# Patient Record
Sex: Male | Born: 1945 | Race: White | Hispanic: No | Marital: Married
Health system: Midwestern US, Academic
[De-identification: ages and names within clinical notes are randomized; demographics above are authoritative.]

---

## 2017-04-22 ENCOUNTER — Encounter: Admit: 2017-04-22 | Discharge: 2017-04-22 | Payer: MEDICARE

## 2017-04-29 ENCOUNTER — Encounter: Admit: 2017-04-29 | Discharge: 2017-04-29 | Payer: MEDICARE

## 2017-04-29 DIAGNOSIS — I480 Paroxysmal atrial fibrillation: Principal | ICD-10-CM

## 2017-05-12 ENCOUNTER — Encounter: Admit: 2017-05-12 | Discharge: 2017-05-12 | Payer: MEDICARE

## 2017-05-13 ENCOUNTER — Encounter: Admit: 2017-05-13 | Discharge: 2017-05-13 | Payer: MEDICARE

## 2017-05-13 DIAGNOSIS — I480 Paroxysmal atrial fibrillation: Principal | ICD-10-CM

## 2017-05-14 ENCOUNTER — Encounter: Admit: 2017-05-14 | Discharge: 2017-05-14 | Payer: MEDICARE

## 2017-05-18 LAB — COMPREHENSIVE METABOLIC PANEL
Lab: 15 — ABNORMAL HIGH (ref 0–14)
Lab: 20
Lab: 24
Lab: 53
Lab: 62
Lab: 85

## 2017-05-28 ENCOUNTER — Encounter: Admit: 2017-05-28 | Discharge: 2017-05-28 | Payer: MEDICARE

## 2017-05-28 DIAGNOSIS — I480 Paroxysmal atrial fibrillation: Principal | ICD-10-CM

## 2017-06-04 ENCOUNTER — Encounter: Admit: 2017-06-04 | Discharge: 2017-06-04 | Payer: MEDICARE

## 2017-06-04 DIAGNOSIS — I480 Paroxysmal atrial fibrillation: Principal | ICD-10-CM

## 2017-06-17 ENCOUNTER — Encounter: Admit: 2017-06-17 | Discharge: 2017-06-17 | Payer: MEDICARE

## 2017-06-22 ENCOUNTER — Encounter: Admit: 2017-06-22 | Discharge: 2017-06-22 | Payer: MEDICARE

## 2017-07-01 ENCOUNTER — Encounter: Admit: 2017-07-01 | Discharge: 2017-07-01 | Payer: MEDICARE

## 2017-07-01 DIAGNOSIS — I480 Paroxysmal atrial fibrillation: Principal | ICD-10-CM

## 2017-07-08 ENCOUNTER — Ambulatory Visit: Admit: 2017-07-08 | Discharge: 2017-07-09 | Payer: MEDICARE

## 2017-07-09 DIAGNOSIS — Z950 Presence of cardiac pacemaker: ICD-10-CM

## 2017-07-09 DIAGNOSIS — I495 Sick sinus syndrome: Principal | ICD-10-CM

## 2017-07-15 ENCOUNTER — Encounter: Admit: 2017-07-15 | Discharge: 2017-07-15 | Payer: MEDICARE

## 2017-07-15 DIAGNOSIS — I480 Paroxysmal atrial fibrillation: Principal | ICD-10-CM

## 2017-07-29 ENCOUNTER — Encounter: Admit: 2017-07-29 | Discharge: 2017-07-29 | Payer: MEDICARE

## 2017-07-29 DIAGNOSIS — I480 Paroxysmal atrial fibrillation: Principal | ICD-10-CM

## 2017-08-11 ENCOUNTER — Encounter: Admit: 2017-08-11 | Discharge: 2017-08-12 | Payer: MEDICARE

## 2017-08-11 DIAGNOSIS — R69 Illness, unspecified: Principal | ICD-10-CM

## 2017-08-18 ENCOUNTER — Encounter: Admit: 2017-08-18 | Discharge: 2017-08-18 | Payer: MEDICARE

## 2017-08-18 DIAGNOSIS — I480 Paroxysmal atrial fibrillation: Principal | ICD-10-CM

## 2017-08-25 ENCOUNTER — Encounter: Admit: 2017-08-25 | Discharge: 2017-08-25 | Payer: MEDICARE

## 2017-08-25 DIAGNOSIS — R69 Illness, unspecified: Principal | ICD-10-CM

## 2017-08-31 ENCOUNTER — Encounter: Admit: 2017-08-31 | Discharge: 2017-08-31 | Payer: MEDICARE

## 2017-08-31 DIAGNOSIS — I480 Paroxysmal atrial fibrillation: Principal | ICD-10-CM

## 2017-09-10 ENCOUNTER — Encounter: Admit: 2017-09-10 | Discharge: 2017-09-10 | Payer: MEDICARE

## 2017-09-10 DIAGNOSIS — Z950 Presence of cardiac pacemaker: ICD-10-CM

## 2017-09-10 DIAGNOSIS — E785 Hyperlipidemia, unspecified: Principal | ICD-10-CM

## 2017-09-10 DIAGNOSIS — R0789 Other chest pain: ICD-10-CM

## 2017-09-10 DIAGNOSIS — I2510 Atherosclerotic heart disease of native coronary artery without angina pectoris: ICD-10-CM

## 2017-09-10 DIAGNOSIS — I495 Sick sinus syndrome: ICD-10-CM

## 2017-09-10 DIAGNOSIS — I480 Paroxysmal atrial fibrillation: ICD-10-CM

## 2017-09-10 DIAGNOSIS — Z7901 Long term (current) use of anticoagulants: ICD-10-CM

## 2017-09-10 MED ORDER — ATORVASTATIN 20 MG PO TAB
ORAL_TABLET | Freq: Every day | 0 refills | Status: AC
Start: 2017-09-10 — End: 2018-01-25

## 2017-09-10 MED ORDER — CARVEDILOL 25 MG PO TAB
ORAL_TABLET | Freq: Two times a day (BID) | ORAL | 0 refills | 90.00000 days | Status: AC
Start: 2017-09-10 — End: 2018-04-01

## 2017-09-10 MED ORDER — WARFARIN 4 MG PO TAB
ORAL_TABLET | ORAL | 3 refills | 90.00000 days | Status: AC
Start: 2017-09-10 — End: 2017-09-14

## 2017-09-11 ENCOUNTER — Encounter: Admit: 2017-09-11 | Discharge: 2017-09-11 | Payer: MEDICARE

## 2017-09-11 DIAGNOSIS — I480 Paroxysmal atrial fibrillation: Principal | ICD-10-CM

## 2017-09-14 ENCOUNTER — Encounter: Admit: 2017-09-14 | Discharge: 2017-09-14 | Payer: MEDICARE

## 2017-09-14 MED ORDER — WARFARIN 4 MG PO TAB
ORAL_TABLET | ORAL | 3 refills | 90.00000 days | Status: AC
Start: 2017-09-14 — End: 2019-05-03

## 2017-09-23 ENCOUNTER — Ambulatory Visit: Admit: 2017-09-23 | Discharge: 2017-09-24 | Payer: MEDICARE

## 2017-09-23 ENCOUNTER — Encounter: Admit: 2017-09-23 | Discharge: 2017-09-23 | Payer: MEDICARE

## 2017-09-23 DIAGNOSIS — I480 Paroxysmal atrial fibrillation: Principal | ICD-10-CM

## 2017-09-23 DIAGNOSIS — I444 Left anterior fascicular block: ICD-10-CM

## 2017-09-23 DIAGNOSIS — G4730 Sleep apnea, unspecified: ICD-10-CM

## 2017-09-23 DIAGNOSIS — I2510 Atherosclerotic heart disease of native coronary artery without angina pectoris: ICD-10-CM

## 2017-09-23 DIAGNOSIS — I495 Sick sinus syndrome: ICD-10-CM

## 2017-09-23 DIAGNOSIS — G629 Polyneuropathy, unspecified: ICD-10-CM

## 2017-09-23 DIAGNOSIS — I952 Hypotension due to drugs: ICD-10-CM

## 2017-09-23 DIAGNOSIS — Z950 Presence of cardiac pacemaker: ICD-10-CM

## 2017-09-23 DIAGNOSIS — Z7901 Long term (current) use of anticoagulants: ICD-10-CM

## 2017-09-23 DIAGNOSIS — E782 Mixed hyperlipidemia: ICD-10-CM

## 2017-09-23 DIAGNOSIS — E785 Hyperlipidemia, unspecified: Secondary | ICD-10-CM

## 2017-09-23 DIAGNOSIS — I4891 Unspecified atrial fibrillation: ICD-10-CM

## 2017-09-23 DIAGNOSIS — E669 Obesity, unspecified: ICD-10-CM

## 2017-09-23 DIAGNOSIS — I493 Ventricular premature depolarization: ICD-10-CM

## 2017-09-23 NOTE — Progress Notes
Date of Service: 09/23/2017    Robert Mercado is a 71 y.o. male.       HPI     [name] is a very pleasant 70 year old white male with a history of coronary artery disease, status post previous intervention, history of atrial fibrillation and sick sinus syndrome and status post permanent pacemaker implant, previously was on sotalol therapy.  Patient underwent a pacemaker check on 08/11/2016 and it was noted at that time that he had a significant increase in the PVC burden.  He was also evaluated with a Holter monitor on 10/17/2016 and it was found that a total PVC burden was approximately 28%.    Patient was evaluated with a myocardial perfusion imaging study on 10/27/2016, ejection fraction was 43%, the perfusion pattern did show a defect involving the inferior and inferior septal wall, it was predominantly fixed with some reversal distribution and overall thought to be due to soft tissue attenuation.    Patient was evaluated by Dr. Betti Cruz in the office and he eventually underwent PVC ablation on 12/31/2016.  Prior to this he was admitted at [facility] with chest pain, at that hospital ejection fraction was assessed to be 35% and he underwent coronary angiography, he was not found to have any progression of disease.    Following the PVC ablation procedure, in May 2018 patient was evaluated with a Holter monitor, and these demonstrate 1% bottom of ventricular ectopic beats.    Patient is here today that he reports feeling very well from a cardiac standpoint.  He has not had any further episodes of heart palpitations.  The most recent assessment of the left ventricular systolic function predates the EP study and the PVC ablation procedure.         Vitals:    09/23/17 1005   BP: 138/80   Pulse: 67   Weight: 127 kg (280 lb)   Height: 1.854 m (6' 1)     Body mass index is 36.94 kg/m???.     Past Medical History  Patient Active Problem List    Diagnosis Date Noted ??? PVC (premature ventricular contraction) 12/31/2016   ??? PVC's (premature ventricular contractions) 11/06/2016     0214/18 PVC ablation - x 3 morphologies RVOT      ??? Preoperative cardiovascular examination 04/28/2016   ??? Hospital discharge follow-up 03/10/2016   ??? Chronic fatigue 08/21/2015   ??? Hypotension due to drugs 08/21/2015   ??? 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 Chronic anticoagulation, on warfarin 08/16/2014   ??? Sleep apnea 08/17/2013   ??? Obesity (BMI 30-39.9) 08/17/2013   ??? Paroxysmal atrial fibrillation (HCC) 04/19/2012   ??? 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 Chest pain 05/02/2011   ??? SSS (sick sinus syndrome) (HCC) 05/21/2010   ??? HLD (hyperlipidemia) 11/12/2009   ??? CAD (coronary artery disease) 11/12/2009     08/16/14: Cath by Dr. Mackey Birchwood: 30% instent restenosis of previously placed stent in the LAD, and patent 2nd DIAG stent and 60% distal LAD disease. Medical management     ??? Peripheral neuropathy 11/12/2009         Review of Systems   Constitution: Negative.   HENT: Positive for tinnitus.    Eyes: Negative.    Cardiovascular: Positive for dyspnea on exertion.   Respiratory: Positive for snoring.    Endocrine: Negative.    Hematologic/Lymphatic: Negative.    Skin: Negative.    Musculoskeletal: Positive for arthritis, back pain, joint pain, joint  swelling, muscle weakness, neck pain and stiffness.   Gastrointestinal: Negative.    Genitourinary: Negative.    Neurological: Negative.    Psychiatric/Behavioral: Negative.    Allergic/Immunologic: Negative.        Physical Exam  General Appearance: normal in appearance  Skin: warm, moist, no ulcers or xanthomas  Eyes: conjunctivae and lids normal, pupils are equal and round  Lips & Oral Mucosa: no pallor or cyanosis  Neck Veins: neck veins are flat, neck veins are not distended  Chest Inspection: chest is normal in appearance  Respiratory Effort: breathing comfortably, no respiratory distress Auscultation/Percussion: lungs clear to auscultation, no rales or rhonchi, no wheezing  Cardiac Rhythm: regular rhythm and normal rate  Cardiac Auscultation: S1, S2 normal, no rub, no gallop  Murmurs: no murmur  Carotid Arteries: normal carotid upstroke bilaterally, no bruit  Lower Extremity Edema: no lower extremity edema  Abdominal Exam: soft, non-tender, no masses, bowel sounds normal  Liver & Spleen: no organomegaly  Language and Memory: patient responsive and seems to comprehend information  Neurologic Exam: neurological assessment grossly intact      Cardiovascular Studies      Problems Addressed Today  Encounter Diagnoses   Name Primary?   ??? Paroxysmal atrial fibrillation (HCC) Yes   ??? Coronary artery disease involving native coronary artery of native heart without angina pectoris    ??? Mixed hyperlipidemia    ??? SSS (sick sinus syndrome) (HCC)    ??? Hypotension due to drugs    ??? LAFB (left anterior fascicular block)    ??? PVC (premature ventricular contraction)    ??? PVC's (premature ventricular contractions)    ??? Obesity (BMI 30-39.9)    ??? Chronic anticoagulation, on warfarin    ??? Cardiac pacemaker in situ        Assessment and Plan     Assessment:    1.  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 Patient underwent EP study and PVC ablation on 12/31/2016  ??? The postoperative Holter monitor demonstrated that the PVCs have decreased from 28% total burden to 1%    2.  History of sick sinus syndrome  ??? Patient underwent previous permanent pacemaker implant    3.  History of paroxysmal atrial fibrillation  ??? Patient has been in normal sinus rhythm  ??? Patient has been previously on sotalol therapy  ??? Patient continues to remain anticoagulated with warfarin    4.  Stable ischemic heart disease  ??? Patient underwent PTCA of the LAD on March 01 2009  ??? Patient underwent PCI of the diagonal branch at [facility] in [place] on January 12, 2012 ??? A left heart catheterization was performed on 08/16/2014, revealed 30% in-stent restenosis of a previously placed stent in the LAD, patent stent in the diagonal branch and 60% distal LAD  ??? The most recent left heart catheterization is dated 11/11/2016, it was performed at [facility], patient was not found to have any obstructive CAD    Plan:    1.  I did ask Mr. Evilsizor to continue all current medications  2.  2D echo Doppler study to be performed at patient's earliest convenience, will follow up on the results of the test and will call with further recommendations  3.  Follow-up office visit in 6 months in our [place] office         Current Medications (including today's revisions)  ??? aspirin EC 81 mg tablet Take 81  mg by mouth daily.   ??? atorvastatin (LIPITOR) 20 mg tablet TAKE 1 TABLET EVERY DAY   ??? carvedilol (COREG) 25 mg tablet TAKE 1/2 TABLET TWICE DAILY   ??? coenzyme Q10(+) (CO Q-10) 100 mg cap Take 100 mg by mouth daily.   ??? diltiazem CD (CARDIZEM CD) 180 mg capsule Take 180 mg by mouth twice daily.   ??? diphenhydrAMINE (BENADRYL) 25 mg capsule Take 50 mg by mouth at bedtime daily.   ??? finasteride (PROSCAR) 5 mg tablet Take 5 mg by mouth daily.   ??? gabapentin (NEURONTIN) 600 mg PO tablet Take 600 mg by mouth Three Times Daily.   ??? HYDROcodone-acetaminophen(+) (NORCO) 7.5-325 mg tablet Take 1 Tab by mouth every 6 hours as needed.   ??? losartan (COZAAR) 25 mg tablet Take 1 tablet by mouth daily.   ??? nitroglycerin (NITROSTAT) 0.4 mg tablet Take one tablet sublingually as needed every 5 min for chest pain X three   ??? pantoprazole DR (PROTONIX) 20 mg tablet Take 40 mg by mouth daily.   ??? vitamins, B complex PO Tab Take 1 Tab by mouth Daily.   ??? vitamins, multiple PO Cap Take 1 Cap by mouth Daily.   ??? warfarin (COUMADIN) 4 mg tablet Take 1-2 tabs as directed by [facility]

## 2017-09-28 ENCOUNTER — Encounter: Admit: 2017-09-28 | Discharge: 2017-09-28 | Payer: MEDICARE

## 2017-09-30 ENCOUNTER — Ambulatory Visit: Admit: 2017-09-30 | Discharge: 2017-10-01 | Payer: MEDICARE

## 2017-09-30 DIAGNOSIS — I2510 Atherosclerotic heart disease of native coronary artery without angina pectoris: ICD-10-CM

## 2017-09-30 DIAGNOSIS — I480 Paroxysmal atrial fibrillation: Principal | ICD-10-CM

## 2017-09-30 MED ORDER — PERFLUTREN LIPID MICROSPHERES 1.1 MG/ML IV SUSP
1-20 mL | Freq: Once | INTRAVENOUS | 0 refills | Status: CP
Start: 2017-09-30 — End: ?

## 2017-09-30 NOTE — Progress Notes
Peripheral IV Insertion Note:  Patient Side: right  Line Orientation:Antecubital  IV Catheter Size: 20G  Number of Attempts:1.  IV capped and flushed with Normal Saline.  IV site without redness, swelling, or pain.  New dressing placed.    After procedure IV cannula removed intact and hemostasis achieved.    Procedure explained, questions answered and Definity administered per standard without complications.   Total of 2 ml of Definity/NS given slow IVP per sonographer direction.

## 2017-10-05 ENCOUNTER — Encounter: Admit: 2017-10-05 | Discharge: 2017-10-05 | Payer: MEDICARE

## 2017-10-12 ENCOUNTER — Encounter: Admit: 2017-10-12 | Discharge: 2017-10-12 | Payer: MEDICARE

## 2017-10-12 DIAGNOSIS — I480 Paroxysmal atrial fibrillation: Principal | ICD-10-CM

## 2017-10-12 NOTE — Telephone Encounter
-----   Message from Nickolas MadridMarina Hannen, MD sent at 10/11/2017  5:07 PM CST -----  Dear Nurses,     Please let this patient know that his echocardiogram is overall unremarkable.  I think he should follow-up with me in the office in 6 months from the last office visit.    Thank you      ----- Message -----  From: Laurence Alyosamond, Thomas L, MD  Sent: 09/30/2017   4:17 PM  To: Nickolas MadridMarina Hannen, MD

## 2017-10-12 NOTE — Telephone Encounter
Results and recommendations called to patient.

## 2017-10-21 ENCOUNTER — Encounter: Admit: 2017-10-21 | Discharge: 2017-10-21 | Payer: MEDICARE

## 2017-10-21 ENCOUNTER — Ambulatory Visit: Admit: 2017-10-21 | Discharge: 2017-10-21 | Payer: MEDICARE

## 2017-10-21 DIAGNOSIS — Z950 Presence of cardiac pacemaker: ICD-10-CM

## 2017-10-21 DIAGNOSIS — G4730 Sleep apnea, unspecified: ICD-10-CM

## 2017-10-21 DIAGNOSIS — M654 Radial styloid tenosynovitis [de Quervain]: Principal | ICD-10-CM

## 2017-10-21 DIAGNOSIS — I480 Paroxysmal atrial fibrillation: ICD-10-CM

## 2017-10-21 DIAGNOSIS — I4891 Unspecified atrial fibrillation: ICD-10-CM

## 2017-10-21 DIAGNOSIS — Z7901 Long term (current) use of anticoagulants: ICD-10-CM

## 2017-10-21 DIAGNOSIS — E785 Hyperlipidemia, unspecified: Secondary | ICD-10-CM

## 2017-10-21 DIAGNOSIS — I2510 Atherosclerotic heart disease of native coronary artery without angina pectoris: Principal | ICD-10-CM

## 2017-10-21 DIAGNOSIS — I495 Sick sinus syndrome: ICD-10-CM

## 2017-10-21 DIAGNOSIS — G629 Polyneuropathy, unspecified: ICD-10-CM

## 2017-10-21 DIAGNOSIS — E669 Obesity, unspecified: ICD-10-CM

## 2017-10-21 NOTE — Progress Notes
Chief Complaint   Patient presents with   ??? New Patient     right wrist pain       History of Present Illness:     [name] is a 71 year old male presents with chief complaint of right wrist pain.  He states the wrist pain is been there for months to potentially years.  He says it began after lifting himself off a recliner with clenched fist.  He denies any trauma though.  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  He was a Education officer, environmental and a Runner, broadcasting/film/video.  He states that he thinks the pain is getting worse and he also told me that he had seen an orthopedic surgeon that noticed something and referred him to a hand specialist.  Of note he is on warfarin due to chronic atrial fibrillation as well as previous heart surgery with stenting.  He describes the pain on the dorsal aspect of the wrist as well as the dorsal radial aspect of the wrist.  He denies any numbness or tingling.    The following portions of the patient's history were reviewed and updated as appropriate.  Past Medical History:     Past Medical History:   Diagnosis Date   ??? Atrial fibrillation (HCC)    ??? CAD (coronary artery disease)    ??? 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 Chronic anticoagulation, on warfarin 08/16/2014   ??? HLD (hyperlipidemia) 11/12/2009   ??? Hyperlipemia    ??? Obesity (BMI 30-39.9) 08/17/2013   ??? Paroxysmal atrial fibrillation (HCC) 04/19/2012   ??? Peripheral neuropathy 11/12/2009   ??? Sleep apnea 08/17/2013   ??? Squamous cell carcinoma nos    ??? SSS (sick sinus syndrome) (HCC) 05/21/2010       Past Surgical History:     Past Surgical History:   Procedure Laterality Date   ??? APPENDECTOMY     ??? CARDIOVASCULAR STRESS TEST     ??? CHOLECYSTECTOMY     ??? CORONARY ANGIOPLASTY     ??? EYE SURGERY     ??? KNEE REPLACEMENT      right   ??? LAMINECTOMY     ??? MYOCARDIAL PERFUSION IMG STUDY     ??? SHOULDER SURGERY     ??? SINUS SURGERY         Family History:  Family History   Problem Relation Age of Onset ??? Coronary Artery Disease Mother    ??? Heart Failure Mother    ??? Coronary Artery Disease Father    ??? Heart Attack Father    ??? Heart Failure Father    ??? Coronary Artery Disease Brother    ??? Coronary Artery Disease Brother      Social History:   Social History     Social History   ??? Marital status: Married     Spouse name: N/A   ??? Number of children: N/A   ??? Years of education: N/A     Social History Main Topics   ??? Smoking status: Never Smoker   ??? Smokeless tobacco: Never Used   ??? Alcohol use No   ??? Drug use: No   ??? Sexual activity: Not on file     Other Topics Concern   ??? Not on file     Social History Narrative   ??? No narrative on file       Current Medications: has a current medication list which includes the following prescription(s): aspirin ec,  atorvastatin, carvedilol, coenzyme q10(+), diltiazem cd, diphenhydramine, finasteride, gabapentin, hydrocodone/acetaminophen, losartan, nitroglycerin, pantoprazole dr, vitamins, b complex, vitamins, multiple, and warfarin.    Allergies: is allergic to demerol (pf) [meperidine (pf)]; lyrica [pregabalin]; and tuberculin ppd tine test.    Review of Systems:  Review of Systems   Musculoskeletal: Positive for arthralgias.   Neurological: Positive for weakness.   All other systems reviewed and are negative.    General Physical Exam:  General/Constitutional:No apparent distress: well-nourished and well developed.  Eyes: Sclera nonicteric, conjunctiva clear  Respiratory:No shortness of breath or dyspnea  Cardiac: No clubbing, cyanosis, or edema  Vascular: No edema, swelling or tenderness, except as noted in detailed exam.  Integumentary:No impressive skin lesions present, except as noted in detailed exam.  Neuro/Psych: Normal mood and affect, oriented to person, place and time.  Musculoskeletal: Normal, except as noted in detailed exam and in HPI.      Focused Physical Examination:  Ht 185.4 cm (73)  - Wt 127 kg (280 lb)  - BMI 36.94 kg/m??? The patient is alert and orientated to person, place, and time, is in no acute distress, and is cooperative with the examination. Normocephalic, PERRLA, non-labored breathing.    The right wrist is without obvious deformity there is some swelling to the dorsal aspect of the wrist.  Flexor and extensor tendon functions are grossly intact to all digits and the tendinous compartments are non-tender.  The ECU is stable and insertion is non-tender. No palpable crepitance to palpation of the flexor nor extensor compartments. Positive Finklestein???s.  The wrist is tender most over the first extensor compartment as well as dorsally over the EDC.  The DRUJ is non-tender and is stable. The hook of the hamate is non-tender.    Negative Watson???s. Negative Lichtman???s.  Negative LT ballotment test   Negative Ulnocarpal abutment test  Negative Piano Key test  No Foveal tenderness  No pisotriquetral tenderness     Wrist Extension: 60  Wrist Flexion: 60  Pronation and supination is WNL and symmetric to contalateral side    Neurologic function is grossly intact to the right upper limb, including Radial, Ulnar, Median, AIN, and PIN sensory-motor functions. No evidence of CRPS: no dystrophic changes, abnormal hair growth, skin sweating.  No evidence of dysvascular changes.      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   No fractures noted.    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  It seems that the majority of pain is coming from the de Quervain's tenosynovitis.  Secondly quite a bit of pain is coming from his dorsal wrist.  For this reason we have offered 2 options one would be bracing which would include a thumb spica and a wrist splint that would immobilize the wrist.  We would do this almost 24 hours a day for 3 weeks and then at night for another 3 weeks and would see him back in 6 weeks total.  The other option is we could try steroid injection to the first extensor compartment and see how this works along with bracing.  We also discussed the risk of a steroid injection which would  include neurovascular damage as well as skin pigmentation changes.  The patient decided that he would prefer at this time to try bracing for 6 weeks.  We will see him back at the [employer] as he is about 50 miles [place].  All questions were answered.    Letitia Neri, MD  Assistant Professor,  Department of Orthopaedic Surgery  [employer]

## 2017-10-26 ENCOUNTER — Encounter: Admit: 2017-10-26 | Discharge: 2017-10-26 | Payer: MEDICARE

## 2017-10-26 DIAGNOSIS — I480 Paroxysmal atrial fibrillation: Principal | ICD-10-CM

## 2017-11-02 ENCOUNTER — Encounter: Admit: 2017-11-02 | Discharge: 2017-11-02 | Payer: MEDICARE

## 2017-11-09 ENCOUNTER — Encounter: Admit: 2017-11-09 | Discharge: 2017-11-09 | Payer: MEDICARE

## 2017-11-09 DIAGNOSIS — I480 Paroxysmal atrial fibrillation: Principal | ICD-10-CM

## 2017-11-23 ENCOUNTER — Encounter: Admit: 2017-11-23 | Discharge: 2017-11-23 | Payer: MEDICARE

## 2017-11-23 DIAGNOSIS — I480 Paroxysmal atrial fibrillation: Principal | ICD-10-CM

## 2017-12-03 ENCOUNTER — Ambulatory Visit: Admit: 2017-12-03 | Discharge: 2017-12-04 | Payer: MEDICARE

## 2017-12-04 DIAGNOSIS — Z950 Presence of cardiac pacemaker: ICD-10-CM

## 2017-12-04 DIAGNOSIS — I495 Sick sinus syndrome: Principal | ICD-10-CM

## 2017-12-07 ENCOUNTER — Encounter: Admit: 2017-12-07 | Discharge: 2017-12-07 | Payer: MEDICARE

## 2017-12-07 DIAGNOSIS — I4891 Unspecified atrial fibrillation: Principal | ICD-10-CM

## 2017-12-07 DIAGNOSIS — Z7901 Long term (current) use of anticoagulants: ICD-10-CM

## 2017-12-08 ENCOUNTER — Encounter: Admit: 2017-12-08 | Discharge: 2017-12-08 | Payer: MEDICARE

## 2017-12-08 ENCOUNTER — Encounter: Admit: 2017-12-08 | Discharge: 2017-12-09 | Payer: MEDICARE

## 2017-12-08 DIAGNOSIS — I480 Paroxysmal atrial fibrillation: Principal | ICD-10-CM

## 2017-12-09 ENCOUNTER — Encounter: Admit: 2017-12-09 | Discharge: 2017-12-09 | Payer: MEDICARE

## 2017-12-11 ENCOUNTER — Ambulatory Visit: Admit: 2017-12-11 | Discharge: 2017-12-12 | Payer: MEDICARE

## 2017-12-11 ENCOUNTER — Encounter: Admit: 2017-12-11 | Discharge: 2017-12-11 | Payer: MEDICARE

## 2017-12-11 DIAGNOSIS — Z950 Presence of cardiac pacemaker: ICD-10-CM

## 2017-12-11 DIAGNOSIS — G4730 Sleep apnea, unspecified: ICD-10-CM

## 2017-12-11 DIAGNOSIS — I480 Paroxysmal atrial fibrillation: ICD-10-CM

## 2017-12-11 DIAGNOSIS — E785 Hyperlipidemia, unspecified: Secondary | ICD-10-CM

## 2017-12-11 DIAGNOSIS — I4891 Unspecified atrial fibrillation: ICD-10-CM

## 2017-12-11 DIAGNOSIS — I2510 Atherosclerotic heart disease of native coronary artery without angina pectoris: Principal | ICD-10-CM

## 2017-12-11 DIAGNOSIS — I495 Sick sinus syndrome: ICD-10-CM

## 2017-12-11 DIAGNOSIS — I493 Ventricular premature depolarization: ICD-10-CM

## 2017-12-11 DIAGNOSIS — G629 Polyneuropathy, unspecified: ICD-10-CM

## 2017-12-11 DIAGNOSIS — Z7901 Long term (current) use of anticoagulants: ICD-10-CM

## 2017-12-11 DIAGNOSIS — E669 Obesity, unspecified: ICD-10-CM

## 2017-12-15 ENCOUNTER — Encounter: Admit: 2017-12-15 | Discharge: 2017-12-15 | Payer: MEDICARE

## 2017-12-15 DIAGNOSIS — E785 Hyperlipidemia, unspecified: ICD-10-CM

## 2017-12-15 DIAGNOSIS — G629 Polyneuropathy, unspecified: ICD-10-CM

## 2017-12-15 DIAGNOSIS — Z950 Presence of cardiac pacemaker: ICD-10-CM

## 2017-12-15 DIAGNOSIS — I480 Paroxysmal atrial fibrillation: ICD-10-CM

## 2017-12-15 DIAGNOSIS — I2510 Atherosclerotic heart disease of native coronary artery without angina pectoris: Principal | ICD-10-CM

## 2017-12-15 DIAGNOSIS — Z7901 Long term (current) use of anticoagulants: ICD-10-CM

## 2017-12-15 DIAGNOSIS — I4891 Unspecified atrial fibrillation: ICD-10-CM

## 2017-12-15 DIAGNOSIS — I495 Sick sinus syndrome: ICD-10-CM

## 2017-12-15 DIAGNOSIS — G4730 Sleep apnea, unspecified: ICD-10-CM

## 2017-12-15 DIAGNOSIS — E669 Obesity, unspecified: ICD-10-CM

## 2017-12-23 ENCOUNTER — Encounter: Admit: 2017-12-23 | Discharge: 2017-12-23 | Payer: MEDICARE

## 2017-12-23 DIAGNOSIS — Z7901 Long term (current) use of anticoagulants: ICD-10-CM

## 2017-12-23 DIAGNOSIS — I4891 Unspecified atrial fibrillation: Principal | ICD-10-CM

## 2017-12-23 LAB — PROTIME INR (PT): Lab: 2.1

## 2018-01-04 ENCOUNTER — Encounter: Admit: 2018-01-04 | Discharge: 2018-01-04 | Payer: MEDICARE

## 2018-01-04 DIAGNOSIS — I4891 Unspecified atrial fibrillation: Principal | ICD-10-CM

## 2018-01-04 DIAGNOSIS — Z7901 Long term (current) use of anticoagulants: ICD-10-CM

## 2018-01-04 LAB — PROTIME INR (PT): Lab: 2.2

## 2018-01-18 ENCOUNTER — Encounter: Admit: 2018-01-18 | Discharge: 2018-01-18 | Payer: MEDICARE

## 2018-01-18 DIAGNOSIS — I480 Paroxysmal atrial fibrillation: Principal | ICD-10-CM

## 2018-01-18 DIAGNOSIS — I4891 Unspecified atrial fibrillation: ICD-10-CM

## 2018-01-18 DIAGNOSIS — Z7901 Long term (current) use of anticoagulants: ICD-10-CM

## 2018-01-18 LAB — PROTIME INR (PT): Lab: 2

## 2018-01-25 ENCOUNTER — Encounter: Admit: 2018-01-25 | Discharge: 2018-01-25 | Payer: MEDICARE

## 2018-01-25 DIAGNOSIS — Z950 Presence of cardiac pacemaker: Principal | ICD-10-CM

## 2018-01-25 MED ORDER — ATORVASTATIN 20 MG PO TAB
ORAL_TABLET | Freq: Every day | 0 refills | Status: AC
Start: 2018-01-25 — End: 2018-04-26

## 2018-02-01 ENCOUNTER — Encounter: Admit: 2018-02-01 | Discharge: 2018-02-01 | Payer: MEDICARE

## 2018-02-01 DIAGNOSIS — I4891 Unspecified atrial fibrillation: Principal | ICD-10-CM

## 2018-02-01 DIAGNOSIS — Z7901 Long term (current) use of anticoagulants: ICD-10-CM

## 2018-02-01 DIAGNOSIS — I495 Sick sinus syndrome: Principal | ICD-10-CM

## 2018-02-01 DIAGNOSIS — M5412 Radiculopathy, cervical region: Principal | ICD-10-CM

## 2018-02-01 LAB — PROTIME INR (PT): Lab: 2 K/UL — ABNORMAL LOW (ref 1.0–4.8)

## 2018-02-15 ENCOUNTER — Encounter: Admit: 2018-02-15 | Discharge: 2018-02-15 | Payer: MEDICARE

## 2018-02-18 ENCOUNTER — Encounter: Admit: 2018-02-18 | Discharge: 2018-02-18 | Payer: MEDICARE

## 2018-02-18 DIAGNOSIS — E785 Hyperlipidemia, unspecified: ICD-10-CM

## 2018-02-18 DIAGNOSIS — G629 Polyneuropathy, unspecified: ICD-10-CM

## 2018-02-18 DIAGNOSIS — I4891 Unspecified atrial fibrillation: ICD-10-CM

## 2018-02-18 DIAGNOSIS — I480 Paroxysmal atrial fibrillation: ICD-10-CM

## 2018-02-18 DIAGNOSIS — E669 Obesity, unspecified: ICD-10-CM

## 2018-02-18 DIAGNOSIS — Z7901 Long term (current) use of anticoagulants: ICD-10-CM

## 2018-02-18 DIAGNOSIS — Z950 Presence of cardiac pacemaker: ICD-10-CM

## 2018-02-18 DIAGNOSIS — I495 Sick sinus syndrome: ICD-10-CM

## 2018-02-18 DIAGNOSIS — G4730 Sleep apnea, unspecified: ICD-10-CM

## 2018-02-18 DIAGNOSIS — I2510 Atherosclerotic heart disease of native coronary artery without angina pectoris: Principal | ICD-10-CM

## 2018-02-25 ENCOUNTER — Ambulatory Visit: Admit: 2018-02-25 | Discharge: 2018-02-25 | Payer: MEDICARE

## 2018-02-25 ENCOUNTER — Encounter: Admit: 2018-02-25 | Discharge: 2018-02-25 | Payer: MEDICARE

## 2018-02-25 DIAGNOSIS — Z950 Presence of cardiac pacemaker: ICD-10-CM

## 2018-02-25 DIAGNOSIS — I495 Sick sinus syndrome: ICD-10-CM

## 2018-02-25 DIAGNOSIS — M5412 Radiculopathy, cervical region: Principal | ICD-10-CM

## 2018-02-26 ENCOUNTER — Encounter: Admit: 2018-02-26 | Discharge: 2018-02-26 | Payer: MEDICARE

## 2018-02-26 LAB — CREATINE KINASE-CPK: Lab: 213 — ABNORMAL HIGH (ref 30–200)

## 2018-02-26 LAB — COMPREHENSIVE METABOLIC PANEL
Lab: 1 — ABNORMAL LOW (ref 33.0–37.0)
Lab: 105
Lab: 137 — ABNORMAL LOW (ref 4.70–6.10)
Lab: 19
Lab: 9.7

## 2018-02-26 LAB — CBC: Lab: 4.6 — ABNORMAL LOW (ref 4.8–10.8)

## 2018-02-26 LAB — MYOGLOBIN-CHEM: Lab: 136

## 2018-02-26 LAB — TROPONIN-I

## 2018-03-01 ENCOUNTER — Encounter: Admit: 2018-03-01 | Discharge: 2018-03-01 | Payer: MEDICARE

## 2018-03-08 ENCOUNTER — Encounter: Admit: 2018-03-08 | Discharge: 2018-03-08 | Payer: MEDICARE

## 2018-03-08 DIAGNOSIS — I4891 Unspecified atrial fibrillation: ICD-10-CM

## 2018-03-08 DIAGNOSIS — I480 Paroxysmal atrial fibrillation: Principal | ICD-10-CM

## 2018-03-08 DIAGNOSIS — Z7901 Long term (current) use of anticoagulants: ICD-10-CM

## 2018-03-08 LAB — PROTIME INR (PT): Lab: 1.8 MMOL/L (ref 21–30)

## 2018-03-12 ENCOUNTER — Ambulatory Visit: Admit: 2018-03-12 | Discharge: 2018-03-13 | Payer: MEDICARE

## 2018-03-12 ENCOUNTER — Encounter: Admit: 2018-03-12 | Discharge: 2018-03-12 | Payer: MEDICARE

## 2018-03-12 DIAGNOSIS — Z950 Presence of cardiac pacemaker: Secondary | ICD-10-CM

## 2018-03-13 DIAGNOSIS — I495 Sick sinus syndrome: Principal | ICD-10-CM

## 2018-03-15 ENCOUNTER — Encounter: Admit: 2018-03-15 | Discharge: 2018-03-15 | Payer: MEDICARE

## 2018-03-15 DIAGNOSIS — Z7901 Long term (current) use of anticoagulants: ICD-10-CM

## 2018-03-15 DIAGNOSIS — I4891 Unspecified atrial fibrillation: Principal | ICD-10-CM

## 2018-03-15 LAB — PROTIME INR (PT): Lab: 1.8

## 2018-03-16 ENCOUNTER — Encounter: Admit: 2018-03-16 | Discharge: 2018-03-16 | Payer: MEDICARE

## 2018-03-16 MED ORDER — LOSARTAN 25 MG PO TAB
25 mg | ORAL_TABLET | Freq: Every day | ORAL | 3 refills | 30.00000 days | Status: AC
Start: 2018-03-16 — End: 2018-04-01

## 2018-03-29 ENCOUNTER — Encounter: Admit: 2018-03-29 | Discharge: 2018-03-29 | Payer: MEDICARE

## 2018-03-29 DIAGNOSIS — Z7901 Long term (current) use of anticoagulants: ICD-10-CM

## 2018-03-29 DIAGNOSIS — I4891 Unspecified atrial fibrillation: Principal | ICD-10-CM

## 2018-03-29 LAB — PROTIME INR (PT): Lab: 1.8

## 2018-04-01 ENCOUNTER — Encounter: Admit: 2018-04-01 | Discharge: 2018-04-01 | Payer: MEDICARE

## 2018-04-01 ENCOUNTER — Ambulatory Visit: Admit: 2018-04-01 | Discharge: 2018-04-02 | Payer: MEDICARE

## 2018-04-01 DIAGNOSIS — E785 Hyperlipidemia, unspecified: Secondary | ICD-10-CM

## 2018-04-01 DIAGNOSIS — E782 Mixed hyperlipidemia: ICD-10-CM

## 2018-04-01 DIAGNOSIS — R5382 Chronic fatigue, unspecified: ICD-10-CM

## 2018-04-01 DIAGNOSIS — I4891 Unspecified atrial fibrillation: ICD-10-CM

## 2018-04-01 DIAGNOSIS — I493 Ventricular premature depolarization: Secondary | ICD-10-CM

## 2018-04-01 DIAGNOSIS — I480 Paroxysmal atrial fibrillation: ICD-10-CM

## 2018-04-01 DIAGNOSIS — I2510 Atherosclerotic heart disease of native coronary artery without angina pectoris: Principal | ICD-10-CM

## 2018-04-01 DIAGNOSIS — I495 Sick sinus syndrome: Principal | ICD-10-CM

## 2018-04-01 DIAGNOSIS — E669 Obesity, unspecified: ICD-10-CM

## 2018-04-01 DIAGNOSIS — Z950 Presence of cardiac pacemaker: ICD-10-CM

## 2018-04-01 DIAGNOSIS — I444 Left anterior fascicular block: ICD-10-CM

## 2018-04-01 DIAGNOSIS — G629 Polyneuropathy, unspecified: ICD-10-CM

## 2018-04-01 DIAGNOSIS — Z7901 Long term (current) use of anticoagulants: ICD-10-CM

## 2018-04-01 DIAGNOSIS — G4733 Obstructive sleep apnea (adult) (pediatric): ICD-10-CM

## 2018-04-01 DIAGNOSIS — I952 Hypotension due to drugs: ICD-10-CM

## 2018-04-01 DIAGNOSIS — G4730 Sleep apnea, unspecified: ICD-10-CM

## 2018-04-01 MED ORDER — METOPROLOL SUCCINATE 50 MG PO TB24
50 mg | ORAL_TABLET | Freq: Two times a day (BID) | ORAL | 0 refills | 90.00000 days | Status: AC
Start: 2018-04-01 — End: 2018-04-01

## 2018-04-01 MED ORDER — LOSARTAN 25 MG PO TAB
12.5 mg | ORAL_TABLET | Freq: Every day | ORAL | 3 refills | 30.00000 days | Status: AC
Start: 2018-04-01 — End: 2018-07-30

## 2018-04-01 MED ORDER — METOPROLOL SUCCINATE 50 MG PO TB24
50 mg | ORAL_TABLET | Freq: Two times a day (BID) | ORAL | 3 refills | 90.00000 days | Status: AC
Start: 2018-04-01 — End: 2019-01-25

## 2018-04-01 MED ORDER — METOPROLOL SUCCINATE 50 MG PO TB24
50 mg | ORAL_TABLET | Freq: Every day | ORAL | 3 refills | 90.00000 days | Status: AC
Start: 2018-04-01 — End: 2018-04-01

## 2018-04-05 ENCOUNTER — Encounter: Admit: 2018-04-05 | Discharge: 2018-04-05 | Payer: MEDICARE

## 2018-04-05 DIAGNOSIS — I480 Paroxysmal atrial fibrillation: Principal | ICD-10-CM

## 2018-04-05 DIAGNOSIS — Z7901 Long term (current) use of anticoagulants: ICD-10-CM

## 2018-04-05 DIAGNOSIS — I4891 Unspecified atrial fibrillation: ICD-10-CM

## 2018-04-05 LAB — PROTIME INR (PT): Lab: 2.4

## 2018-04-08 ENCOUNTER — Encounter: Admit: 2018-04-08 | Discharge: 2018-04-08 | Payer: MEDICARE

## 2018-04-15 ENCOUNTER — Encounter: Admit: 2018-04-15 | Discharge: 2018-04-15 | Payer: MEDICARE

## 2018-04-19 ENCOUNTER — Encounter: Admit: 2018-04-19 | Discharge: 2018-04-19 | Payer: MEDICARE

## 2018-04-19 DIAGNOSIS — I4891 Unspecified atrial fibrillation: ICD-10-CM

## 2018-04-19 DIAGNOSIS — Z7901 Long term (current) use of anticoagulants: ICD-10-CM

## 2018-04-19 DIAGNOSIS — I480 Paroxysmal atrial fibrillation: Principal | ICD-10-CM

## 2018-04-19 LAB — PROTIME INR (PT): Lab: 2.3

## 2018-04-26 ENCOUNTER — Encounter: Admit: 2018-04-26 | Discharge: 2018-04-26 | Payer: MEDICARE

## 2018-04-26 MED ORDER — ATORVASTATIN 20 MG PO TAB
ORAL_TABLET | Freq: Every day | 1 refills | Status: AC
Start: 2018-04-26 — End: 2018-10-06

## 2018-05-03 ENCOUNTER — Encounter: Admit: 2018-05-03 | Discharge: 2018-05-03 | Payer: MEDICARE

## 2018-05-03 DIAGNOSIS — Z7901 Long term (current) use of anticoagulants: ICD-10-CM

## 2018-05-03 DIAGNOSIS — I4891 Unspecified atrial fibrillation: Principal | ICD-10-CM

## 2018-05-03 LAB — PROTIME INR (PT): Lab: 2.1 U/L (ref 7–56)

## 2018-05-17 ENCOUNTER — Encounter: Admit: 2018-05-17 | Discharge: 2018-05-17 | Payer: MEDICARE

## 2018-05-17 DIAGNOSIS — E785 Hyperlipidemia, unspecified: ICD-10-CM

## 2018-05-17 DIAGNOSIS — I495 Sick sinus syndrome: ICD-10-CM

## 2018-05-17 DIAGNOSIS — G629 Polyneuropathy, unspecified: ICD-10-CM

## 2018-05-17 DIAGNOSIS — I2510 Atherosclerotic heart disease of native coronary artery without angina pectoris: Principal | ICD-10-CM

## 2018-05-17 DIAGNOSIS — I4891 Unspecified atrial fibrillation: Principal | ICD-10-CM

## 2018-05-17 DIAGNOSIS — G4730 Sleep apnea, unspecified: ICD-10-CM

## 2018-05-17 DIAGNOSIS — Z7901 Long term (current) use of anticoagulants: ICD-10-CM

## 2018-05-17 DIAGNOSIS — E669 Obesity, unspecified: ICD-10-CM

## 2018-05-17 DIAGNOSIS — I480 Paroxysmal atrial fibrillation: ICD-10-CM

## 2018-05-17 DIAGNOSIS — Z950 Presence of cardiac pacemaker: ICD-10-CM

## 2018-05-17 LAB — PROTIME INR (PT): Lab: 2.6

## 2018-05-28 ENCOUNTER — Encounter: Admit: 2018-05-28 | Discharge: 2018-05-28 | Payer: MEDICARE

## 2018-05-28 DIAGNOSIS — M542 Cervicalgia: Principal | ICD-10-CM

## 2018-05-31 ENCOUNTER — Encounter: Admit: 2018-05-31 | Discharge: 2018-05-31 | Payer: MEDICARE

## 2018-05-31 ENCOUNTER — Ambulatory Visit: Admit: 2018-05-31 | Discharge: 2018-05-31 | Payer: MEDICARE

## 2018-05-31 DIAGNOSIS — G4730 Sleep apnea, unspecified: ICD-10-CM

## 2018-05-31 DIAGNOSIS — G629 Polyneuropathy, unspecified: ICD-10-CM

## 2018-05-31 DIAGNOSIS — M47812 Spondylosis without myelopathy or radiculopathy, cervical region: ICD-10-CM

## 2018-05-31 DIAGNOSIS — Z7901 Long term (current) use of anticoagulants: ICD-10-CM

## 2018-05-31 DIAGNOSIS — Z950 Presence of cardiac pacemaker: ICD-10-CM

## 2018-05-31 DIAGNOSIS — M542 Cervicalgia: Principal | ICD-10-CM

## 2018-05-31 DIAGNOSIS — I2510 Atherosclerotic heart disease of native coronary artery without angina pectoris: Principal | ICD-10-CM

## 2018-05-31 DIAGNOSIS — I480 Paroxysmal atrial fibrillation: ICD-10-CM

## 2018-05-31 DIAGNOSIS — I495 Sick sinus syndrome: ICD-10-CM

## 2018-05-31 DIAGNOSIS — I4891 Unspecified atrial fibrillation: ICD-10-CM

## 2018-05-31 DIAGNOSIS — E669 Obesity, unspecified: ICD-10-CM

## 2018-05-31 DIAGNOSIS — E785 Hyperlipidemia, unspecified: Secondary | ICD-10-CM

## 2018-05-31 LAB — PROTIME INR (PT): Lab: 3.4

## 2018-06-01 ENCOUNTER — Encounter: Admit: 2018-06-01 | Discharge: 2018-06-01 | Payer: MEDICARE

## 2018-06-02 ENCOUNTER — Encounter: Admit: 2018-06-02 | Discharge: 2018-06-02 | Payer: MEDICARE

## 2018-06-03 ENCOUNTER — Encounter: Admit: 2018-06-03 | Discharge: 2018-06-03 | Payer: MEDICARE

## 2018-06-03 DIAGNOSIS — I480 Paroxysmal atrial fibrillation: Principal | ICD-10-CM

## 2018-06-07 ENCOUNTER — Encounter: Admit: 2018-06-07 | Discharge: 2018-06-07 | Payer: MEDICARE

## 2018-06-07 DIAGNOSIS — I480 Paroxysmal atrial fibrillation: Principal | ICD-10-CM

## 2018-06-07 LAB — PROTIME INR (PT): Lab: 2.2

## 2018-06-08 ENCOUNTER — Encounter: Admit: 2018-06-08 | Discharge: 2018-06-08 | Payer: MEDICARE

## 2018-06-11 ENCOUNTER — Ambulatory Visit: Admit: 2018-06-11 | Discharge: 2018-06-12 | Payer: MEDICARE

## 2018-06-12 DIAGNOSIS — Z950 Presence of cardiac pacemaker: Principal | ICD-10-CM

## 2018-06-21 ENCOUNTER — Encounter: Admit: 2018-06-21 | Discharge: 2018-06-21 | Payer: MEDICARE

## 2018-06-21 DIAGNOSIS — I4891 Unspecified atrial fibrillation: Principal | ICD-10-CM

## 2018-06-21 DIAGNOSIS — Z7901 Long term (current) use of anticoagulants: ICD-10-CM

## 2018-06-21 LAB — PROTIME INR (PT): Lab: 3

## 2018-07-01 ENCOUNTER — Encounter: Admit: 2018-07-01 | Discharge: 2018-07-01 | Payer: MEDICARE

## 2018-07-01 DIAGNOSIS — R079 Chest pain, unspecified: Principal | ICD-10-CM

## 2018-07-01 MED ORDER — NITROGLYCERIN 0.4 MG SL SUBL
ORAL_TABLET | SUBLINGUAL | 6 refills | 9.00000 days | Status: AC | PRN
Start: 2018-07-01 — End: 2019-05-03

## 2018-07-05 ENCOUNTER — Encounter: Admit: 2018-07-05 | Discharge: 2018-07-05 | Payer: MEDICARE

## 2018-07-09 ENCOUNTER — Ambulatory Visit: Admit: 2018-07-09 | Discharge: 2018-07-10 | Payer: MEDICARE

## 2018-07-09 DIAGNOSIS — R079 Chest pain, unspecified: Principal | ICD-10-CM

## 2018-07-09 MED ORDER — AMINOPHYLLINE 500 MG/20 ML IV SOLN
50 mg | INTRAVENOUS | 0 refills | Status: AC | PRN
Start: 2018-07-09 — End: ?

## 2018-07-09 MED ORDER — NITROGLYCERIN 0.4 MG SL SUBL
.4 mg | SUBLINGUAL | 0 refills | Status: AC | PRN
Start: 2018-07-09 — End: ?

## 2018-07-09 MED ORDER — REGADENOSON 0.4 MG/5 ML IV SYRG
.4 mg | Freq: Once | INTRAVENOUS | 0 refills | Status: CP
Start: 2018-07-09 — End: ?

## 2018-07-09 MED ORDER — SODIUM CHLORIDE 0.9 % IV SOLP
250 mL | INTRAVENOUS | 0 refills | Status: AC | PRN
Start: 2018-07-09 — End: ?

## 2018-07-09 MED ORDER — ALBUTEROL SULFATE 90 MCG/ACTUATION IN HFAA
2 | RESPIRATORY_TRACT | 0 refills | Status: DC | PRN
Start: 2018-07-09 — End: 2018-07-14

## 2018-07-11 ENCOUNTER — Encounter: Admit: 2018-07-11 | Discharge: 2018-07-11 | Payer: MEDICARE

## 2018-07-12 ENCOUNTER — Encounter: Admit: 2018-07-12 | Discharge: 2018-07-12 | Payer: MEDICARE

## 2018-07-16 ENCOUNTER — Encounter: Admit: 2018-07-16 | Discharge: 2018-07-16 | Payer: MEDICARE

## 2018-07-19 LAB — PROTIME INR (PT): Lab: 1.9

## 2018-07-20 ENCOUNTER — Encounter: Admit: 2018-07-20 | Discharge: 2018-07-20 | Payer: MEDICARE

## 2018-07-20 DIAGNOSIS — Z7901 Long term (current) use of anticoagulants: ICD-10-CM

## 2018-07-20 DIAGNOSIS — I480 Paroxysmal atrial fibrillation: Principal | ICD-10-CM

## 2018-07-20 DIAGNOSIS — I4891 Unspecified atrial fibrillation: Principal | ICD-10-CM

## 2018-07-26 ENCOUNTER — Encounter: Admit: 2018-07-26 | Discharge: 2018-07-26 | Payer: MEDICARE

## 2018-07-26 DIAGNOSIS — Z7901 Long term (current) use of anticoagulants: ICD-10-CM

## 2018-07-26 DIAGNOSIS — I4891 Unspecified atrial fibrillation: Principal | ICD-10-CM

## 2018-07-26 LAB — PROTIME INR (PT): Lab: 3.1

## 2018-07-30 ENCOUNTER — Ambulatory Visit: Admit: 2018-07-30 | Discharge: 2018-07-30 | Payer: MEDICARE

## 2018-07-30 ENCOUNTER — Encounter: Admit: 2018-07-30 | Discharge: 2018-07-30 | Payer: MEDICARE

## 2018-07-30 DIAGNOSIS — I2510 Atherosclerotic heart disease of native coronary artery without angina pectoris: Principal | ICD-10-CM

## 2018-07-30 DIAGNOSIS — G4733 Obstructive sleep apnea (adult) (pediatric): ICD-10-CM

## 2018-07-30 DIAGNOSIS — I5042 Chronic combined systolic (congestive) and diastolic (congestive) heart failure: ICD-10-CM

## 2018-07-30 DIAGNOSIS — I495 Sick sinus syndrome: ICD-10-CM

## 2018-07-30 DIAGNOSIS — E669 Obesity, unspecified: ICD-10-CM

## 2018-07-30 DIAGNOSIS — E782 Mixed hyperlipidemia: ICD-10-CM

## 2018-07-30 DIAGNOSIS — Z950 Presence of cardiac pacemaker: ICD-10-CM

## 2018-07-30 DIAGNOSIS — G629 Polyneuropathy, unspecified: ICD-10-CM

## 2018-07-30 DIAGNOSIS — G4730 Sleep apnea, unspecified: ICD-10-CM

## 2018-07-30 DIAGNOSIS — I4891 Unspecified atrial fibrillation: ICD-10-CM

## 2018-07-30 DIAGNOSIS — Z7901 Long term (current) use of anticoagulants: ICD-10-CM

## 2018-07-30 DIAGNOSIS — I480 Paroxysmal atrial fibrillation: ICD-10-CM

## 2018-07-30 DIAGNOSIS — R5382 Chronic fatigue, unspecified: ICD-10-CM

## 2018-07-30 DIAGNOSIS — I952 Hypotension due to drugs: ICD-10-CM

## 2018-07-30 DIAGNOSIS — I493 Ventricular premature depolarization: ICD-10-CM

## 2018-07-30 DIAGNOSIS — I444 Left anterior fascicular block: ICD-10-CM

## 2018-07-30 DIAGNOSIS — E785 Hyperlipidemia, unspecified: Secondary | ICD-10-CM

## 2018-07-30 MED ORDER — RAMIPRIL 1.25 MG PO CAP
1.25 mg | ORAL_CAPSULE | Freq: Every day | ORAL | 2 refills | Status: AC
Start: 2018-07-30 — End: 2018-08-30

## 2018-08-02 ENCOUNTER — Encounter: Admit: 2018-08-02 | Discharge: 2018-08-02 | Payer: MEDICARE

## 2018-08-02 DIAGNOSIS — I480 Paroxysmal atrial fibrillation: Principal | ICD-10-CM

## 2018-08-02 DIAGNOSIS — I4891 Unspecified atrial fibrillation: Principal | ICD-10-CM

## 2018-08-02 DIAGNOSIS — Z7901 Long term (current) use of anticoagulants: ICD-10-CM

## 2018-08-02 LAB — CREATINE KINASE-CPK: Lab: 153 — ABNORMAL LOW (ref 14.0–18.0)

## 2018-08-02 LAB — MYOGLOBIN-CHEM: Lab: 121

## 2018-08-02 LAB — COMPREHENSIVE METABOLIC PANEL
Lab: 0.6
Lab: 1.3 — ABNORMAL HIGH (ref 0.72–1.25)
Lab: 114 — ABNORMAL HIGH (ref 83–110)
Lab: 140 — ABNORMAL LOW (ref 4.70–6.10)
Lab: 24
Lab: 3.8
Lab: 6.8
Lab: 9.6

## 2018-08-02 LAB — CBC: Lab: 5.2

## 2018-08-02 LAB — PROTIME INR (PT): Lab: 3.1

## 2018-08-02 LAB — TROPONIN-I

## 2018-08-03 ENCOUNTER — Encounter: Admit: 2018-08-03 | Discharge: 2018-08-03 | Payer: MEDICARE

## 2018-08-03 ENCOUNTER — Ambulatory Visit: Admit: 2018-08-03 | Discharge: 2018-08-03 | Payer: MEDICARE

## 2018-08-03 DIAGNOSIS — E669 Obesity, unspecified: Principal | ICD-10-CM

## 2018-08-03 DIAGNOSIS — M549 Dorsalgia, unspecified: ICD-10-CM

## 2018-08-03 DIAGNOSIS — M47812 Spondylosis without myelopathy or radiculopathy, cervical region: ICD-10-CM

## 2018-08-09 ENCOUNTER — Encounter: Admit: 2018-08-09 | Discharge: 2018-08-09 | Payer: MEDICARE

## 2018-08-09 ENCOUNTER — Ambulatory Visit: Admit: 2018-08-09 | Discharge: 2018-08-10 | Payer: MEDICARE

## 2018-08-09 DIAGNOSIS — I480 Paroxysmal atrial fibrillation: ICD-10-CM

## 2018-08-09 DIAGNOSIS — I495 Sick sinus syndrome: ICD-10-CM

## 2018-08-09 DIAGNOSIS — I444 Left anterior fascicular block: ICD-10-CM

## 2018-08-09 DIAGNOSIS — G629 Polyneuropathy, unspecified: ICD-10-CM

## 2018-08-09 DIAGNOSIS — I952 Hypotension due to drugs: ICD-10-CM

## 2018-08-09 DIAGNOSIS — Z09 Encounter for follow-up examination after completed treatment for conditions other than malignant neoplasm: ICD-10-CM

## 2018-08-09 DIAGNOSIS — Z7901 Long term (current) use of anticoagulants: ICD-10-CM

## 2018-08-09 DIAGNOSIS — I2510 Atherosclerotic heart disease of native coronary artery without angina pectoris: Principal | ICD-10-CM

## 2018-08-09 DIAGNOSIS — E669 Obesity, unspecified: ICD-10-CM

## 2018-08-09 DIAGNOSIS — Z950 Presence of cardiac pacemaker: ICD-10-CM

## 2018-08-09 DIAGNOSIS — E785 Hyperlipidemia, unspecified: ICD-10-CM

## 2018-08-09 DIAGNOSIS — I493 Ventricular premature depolarization: Secondary | ICD-10-CM

## 2018-08-09 DIAGNOSIS — I4891 Unspecified atrial fibrillation: Principal | ICD-10-CM

## 2018-08-09 DIAGNOSIS — E782 Mixed hyperlipidemia: ICD-10-CM

## 2018-08-09 DIAGNOSIS — I5042 Chronic combined systolic (congestive) and diastolic (congestive) heart failure: ICD-10-CM

## 2018-08-09 DIAGNOSIS — G4730 Sleep apnea, unspecified: ICD-10-CM

## 2018-08-09 LAB — PROTIME INR (PT): Lab: 2.5

## 2018-08-09 MED ORDER — LIDOCAINE 5 % TP PTMD
1 | MEDICATED_PATCH | TOPICAL | 3 refills | 30.00000 days | Status: AC
Start: 2018-08-09 — End: ?

## 2018-08-10 ENCOUNTER — Encounter: Admit: 2018-08-10 | Discharge: 2018-08-10 | Payer: MEDICARE

## 2018-08-16 ENCOUNTER — Encounter: Admit: 2018-08-16 | Discharge: 2018-08-16 | Payer: MEDICARE

## 2018-08-16 DIAGNOSIS — Z7901 Long term (current) use of anticoagulants: ICD-10-CM

## 2018-08-16 DIAGNOSIS — I4891 Unspecified atrial fibrillation: Principal | ICD-10-CM

## 2018-08-16 LAB — PROTIME INR (PT): Lab: 2.9

## 2018-08-19 ENCOUNTER — Encounter: Admit: 2018-08-19 | Discharge: 2018-08-19 | Payer: MEDICARE

## 2018-08-30 ENCOUNTER — Encounter: Admit: 2018-08-30 | Discharge: 2018-08-30 | Payer: MEDICARE

## 2018-08-30 MED ORDER — RAMIPRIL 1.25 MG PO CAP
1.25 mg | ORAL_CAPSULE | Freq: Every day | ORAL | 3 refills | Status: AC
Start: 2018-08-30 — End: 2018-09-21

## 2018-08-31 ENCOUNTER — Encounter: Admit: 2018-08-31 | Discharge: 2018-08-31 | Payer: MEDICARE

## 2018-08-31 DIAGNOSIS — I4891 Unspecified atrial fibrillation: Principal | ICD-10-CM

## 2018-08-31 DIAGNOSIS — I480 Paroxysmal atrial fibrillation: Principal | ICD-10-CM

## 2018-08-31 DIAGNOSIS — Z7901 Long term (current) use of anticoagulants: ICD-10-CM

## 2018-08-31 LAB — PROTIME INR (PT): Lab: 2.6

## 2018-09-14 ENCOUNTER — Encounter: Admit: 2018-09-14 | Discharge: 2018-09-14 | Payer: MEDICARE

## 2018-09-14 DIAGNOSIS — Z7901 Long term (current) use of anticoagulants: ICD-10-CM

## 2018-09-14 DIAGNOSIS — I4891 Unspecified atrial fibrillation: Principal | ICD-10-CM

## 2018-09-14 LAB — PROTIME INR (PT): Lab: 2.5

## 2018-09-15 ENCOUNTER — Encounter: Admit: 2018-09-15 | Discharge: 2018-09-15 | Payer: MEDICARE

## 2018-09-21 ENCOUNTER — Ambulatory Visit: Admit: 2018-09-21 | Discharge: 2018-09-22 | Payer: MEDICARE

## 2018-09-21 ENCOUNTER — Encounter: Admit: 2018-09-21 | Discharge: 2018-09-21 | Payer: MEDICARE

## 2018-09-21 MED ORDER — RAMIPRIL 1.25 MG PO CAP
1.25 mg | ORAL_CAPSULE | Freq: Every day | ORAL | 2 refills | Status: AC
Start: 2018-09-21 — End: 2019-02-08

## 2018-09-22 DIAGNOSIS — Z950 Presence of cardiac pacemaker: Principal | ICD-10-CM

## 2018-09-23 ENCOUNTER — Encounter: Admit: 2018-09-23 | Discharge: 2018-09-23 | Payer: MEDICARE

## 2018-09-27 LAB — BASIC METABOLIC PANEL
Lab: 1.2
Lab: 104
Lab: 118 — ABNORMAL HIGH (ref 83–110)
Lab: 139 — ABNORMAL LOW (ref 14.0–18.0)
Lab: 15 — ABNORMAL HIGH (ref 0–14)
Lab: 22 — ABNORMAL LOW (ref 33.0–37.0)
Lab: 25 — ABNORMAL HIGH (ref 27.0–31.0)
Lab: 61
Lab: 9.6

## 2018-09-27 LAB — C REACTIVE PROTEIN (CRP): Lab: 0.1

## 2018-09-27 LAB — SED RATE: Lab: 27 — ABNORMAL HIGH (ref 0–20)

## 2018-09-27 LAB — CBC: Lab: 6.7 — ABNORMAL LOW (ref 150–450)

## 2018-10-01 ENCOUNTER — Encounter: Admit: 2018-10-01 | Discharge: 2018-10-01 | Payer: MEDICARE

## 2018-10-06 ENCOUNTER — Encounter: Admit: 2018-10-06 | Discharge: 2018-10-06 | Payer: MEDICARE

## 2018-10-06 MED ORDER — ATORVASTATIN 20 MG PO TAB
ORAL_TABLET | Freq: Every day | 1 refills | Status: AC
Start: 2018-10-06 — End: 2019-02-22

## 2018-10-13 ENCOUNTER — Encounter: Admit: 2018-10-13 | Discharge: 2018-10-13 | Payer: MEDICARE

## 2018-10-13 DIAGNOSIS — I4891 Unspecified atrial fibrillation: Principal | ICD-10-CM

## 2018-10-13 DIAGNOSIS — Z7901 Long term (current) use of anticoagulants: ICD-10-CM

## 2018-10-13 DIAGNOSIS — I480 Paroxysmal atrial fibrillation: Principal | ICD-10-CM

## 2018-10-13 LAB — PROTIME INR (PT): Lab: 3.4

## 2018-10-19 ENCOUNTER — Encounter: Admit: 2018-10-19 | Discharge: 2018-10-19 | Payer: MEDICARE

## 2018-10-19 DIAGNOSIS — Z7901 Long term (current) use of anticoagulants: ICD-10-CM

## 2018-10-19 DIAGNOSIS — I480 Paroxysmal atrial fibrillation: Principal | ICD-10-CM

## 2018-10-19 DIAGNOSIS — I4891 Unspecified atrial fibrillation: Principal | ICD-10-CM

## 2018-10-19 LAB — PROTIME INR (PT): Lab: 3.3

## 2018-10-26 ENCOUNTER — Encounter: Admit: 2018-10-26 | Discharge: 2018-10-26 | Payer: MEDICARE

## 2018-10-26 DIAGNOSIS — I480 Paroxysmal atrial fibrillation: Principal | ICD-10-CM

## 2018-10-26 DIAGNOSIS — Z7901 Long term (current) use of anticoagulants: ICD-10-CM

## 2018-10-26 DIAGNOSIS — I4891 Unspecified atrial fibrillation: Principal | ICD-10-CM

## 2018-10-26 LAB — PROTIME INR (PT): Lab: 2.6

## 2018-11-09 ENCOUNTER — Encounter: Admit: 2018-11-09 | Discharge: 2018-11-09 | Payer: MEDICARE

## 2018-11-09 DIAGNOSIS — I480 Paroxysmal atrial fibrillation: Principal | ICD-10-CM

## 2018-11-21 ENCOUNTER — Encounter: Admit: 2018-11-21 | Discharge: 2018-11-21 | Payer: MEDICARE

## 2018-11-22 MED ORDER — DILTIAZEM HCL 180 MG PO CP24
ORAL_CAPSULE | Freq: Two times a day (BID) | 0 refills
Start: 2018-11-22 — End: ?

## 2018-11-23 LAB — PROTIME INR (PT): Lab: 2.7

## 2018-11-24 ENCOUNTER — Encounter: Admit: 2018-11-24 | Discharge: 2018-11-24 | Payer: MEDICARE

## 2018-11-24 DIAGNOSIS — I4891 Unspecified atrial fibrillation: Secondary | ICD-10-CM

## 2018-11-24 DIAGNOSIS — Z7901 Long term (current) use of anticoagulants: Secondary | ICD-10-CM

## 2018-12-07 LAB — PROTIME INR (PT): Lab: 3.4

## 2018-12-14 ENCOUNTER — Encounter: Admit: 2018-12-14 | Discharge: 2018-12-14 | Payer: MEDICARE

## 2018-12-16 ENCOUNTER — Encounter: Admit: 2018-12-16 | Discharge: 2018-12-16 | Payer: MEDICARE

## 2018-12-22 ENCOUNTER — Encounter: Admit: 2018-12-22 | Discharge: 2018-12-22 | Payer: MEDICARE

## 2018-12-22 ENCOUNTER — Ambulatory Visit: Admit: 2018-12-22 | Discharge: 2018-12-22 | Payer: MEDICARE

## 2018-12-22 DIAGNOSIS — I480 Paroxysmal atrial fibrillation: Principal | ICD-10-CM

## 2018-12-22 DIAGNOSIS — Z950 Presence of cardiac pacemaker: Principal | ICD-10-CM

## 2018-12-22 MED ORDER — DILTIAZEM HCL 180 MG PO CP24
180 mg | ORAL_CAPSULE | Freq: Two times a day (BID) | ORAL | 3 refills | 45.00000 days | Status: AC
Start: 2018-12-22 — End: 2019-05-03

## 2019-01-04 ENCOUNTER — Encounter: Admit: 2019-01-04 | Discharge: 2019-01-04 | Payer: MEDICARE

## 2019-01-04 LAB — PROTIME INR (PT): Lab: 1.7

## 2019-01-13 ENCOUNTER — Encounter: Admit: 2019-01-13 | Discharge: 2019-01-13 | Payer: MEDICARE

## 2019-01-13 DIAGNOSIS — I480 Paroxysmal atrial fibrillation: Principal | ICD-10-CM

## 2019-01-24 ENCOUNTER — Encounter: Admit: 2019-01-24 | Discharge: 2019-01-24 | Payer: MEDICARE

## 2019-01-25 MED ORDER — METOPROLOL SUCCINATE 50 MG PO TB24
50 mg | ORAL_TABLET | Freq: Two times a day (BID) | ORAL | 3 refills | 90.00000 days | Status: AC
Start: 2019-01-25 — End: 2019-05-03

## 2019-01-27 ENCOUNTER — Encounter: Admit: 2019-01-27 | Discharge: 2019-01-27 | Payer: MEDICARE

## 2019-01-27 DIAGNOSIS — I480 Paroxysmal atrial fibrillation: Principal | ICD-10-CM

## 2019-01-27 LAB — PROTIME INR (PT): Lab: 2.7

## 2019-02-07 ENCOUNTER — Encounter: Admit: 2019-02-07 | Discharge: 2019-02-07 | Payer: MEDICARE

## 2019-02-08 ENCOUNTER — Encounter: Admit: 2019-02-08 | Discharge: 2019-02-08 | Payer: MEDICARE

## 2019-02-08 MED ORDER — RAMIPRIL 1.25 MG PO CAP
ORAL_CAPSULE | Freq: Every day | ORAL | 2 refills | Status: AC
Start: 2019-02-08 — End: 2019-02-22

## 2019-02-10 ENCOUNTER — Encounter: Admit: 2019-02-10 | Discharge: 2019-02-10 | Payer: MEDICARE

## 2019-02-10 DIAGNOSIS — Z7901 Long term (current) use of anticoagulants: Principal | ICD-10-CM

## 2019-02-10 DIAGNOSIS — I480 Paroxysmal atrial fibrillation: ICD-10-CM

## 2019-02-10 NOTE — Progress Notes
Did not contact pt with therapeutic home INR result as per request in chart notes. Pt to continue current therapeutic dose and recheck again in ~ 2 weeks. No further needs identified at this time.

## 2019-02-13 ENCOUNTER — Encounter: Admit: 2019-02-13 | Discharge: 2019-02-13 | Payer: MEDICARE

## 2019-02-16 ENCOUNTER — Encounter: Admit: 2019-02-16 | Discharge: 2019-02-16 | Payer: MEDICARE

## 2019-02-17 ENCOUNTER — Encounter: Admit: 2019-02-17 | Discharge: 2019-02-17 | Payer: MEDICARE

## 2019-02-18 ENCOUNTER — Encounter: Admit: 2019-02-18 | Discharge: 2019-02-18 | Payer: MEDICARE

## 2019-02-21 ENCOUNTER — Encounter: Admit: 2019-02-21 | Discharge: 2019-02-21 | Payer: MEDICARE

## 2019-02-22 ENCOUNTER — Encounter: Admit: 2019-02-22 | Discharge: 2019-02-22 | Payer: MEDICARE

## 2019-02-22 ENCOUNTER — Ambulatory Visit: Admit: 2019-02-22 | Discharge: 2019-02-23 | Payer: MEDICARE

## 2019-02-22 DIAGNOSIS — I4891 Unspecified atrial fibrillation: ICD-10-CM

## 2019-02-22 DIAGNOSIS — E669 Obesity, unspecified: ICD-10-CM

## 2019-02-22 DIAGNOSIS — I2510 Atherosclerotic heart disease of native coronary artery without angina pectoris: ICD-10-CM

## 2019-02-22 DIAGNOSIS — G629 Polyneuropathy, unspecified: ICD-10-CM

## 2019-02-22 DIAGNOSIS — E785 Hyperlipidemia, unspecified: ICD-10-CM

## 2019-02-22 DIAGNOSIS — Z7901 Long term (current) use of anticoagulants: ICD-10-CM

## 2019-02-22 DIAGNOSIS — I480 Paroxysmal atrial fibrillation: ICD-10-CM

## 2019-02-22 DIAGNOSIS — I495 Sick sinus syndrome: Principal | ICD-10-CM

## 2019-02-22 DIAGNOSIS — I444 Left anterior fascicular block: ICD-10-CM

## 2019-02-22 DIAGNOSIS — I952 Hypotension due to drugs: ICD-10-CM

## 2019-02-22 DIAGNOSIS — Z950 Presence of cardiac pacemaker: ICD-10-CM

## 2019-02-22 DIAGNOSIS — G4730 Sleep apnea, unspecified: ICD-10-CM

## 2019-02-22 DIAGNOSIS — G4733 Obstructive sleep apnea (adult) (pediatric): ICD-10-CM

## 2019-02-22 DIAGNOSIS — I493 Ventricular premature depolarization: Secondary | ICD-10-CM

## 2019-02-22 MED ORDER — ATORVASTATIN 20 MG PO TAB
ORAL_TABLET | Freq: Every day | 3 refills | Status: DC
Start: 2019-02-22 — End: 2019-05-03

## 2019-02-25 ENCOUNTER — Encounter: Admit: 2019-02-25 | Discharge: 2019-02-25 | Payer: MEDICARE

## 2019-03-11 ENCOUNTER — Encounter: Admit: 2019-03-11 | Discharge: 2019-03-11 | Payer: MEDICARE

## 2019-03-11 DIAGNOSIS — I480 Paroxysmal atrial fibrillation: Principal | ICD-10-CM

## 2019-03-23 ENCOUNTER — Encounter: Admit: 2019-03-23 | Discharge: 2019-03-23 | Payer: MEDICARE

## 2019-03-23 ENCOUNTER — Ambulatory Visit: Admit: 2019-03-23 | Discharge: 2019-03-23 | Payer: MEDICARE

## 2019-03-23 DIAGNOSIS — I495 Sick sinus syndrome: Principal | ICD-10-CM

## 2019-03-23 DIAGNOSIS — Z950 Presence of cardiac pacemaker: ICD-10-CM

## 2019-03-25 ENCOUNTER — Encounter: Admit: 2019-03-25 | Discharge: 2019-03-25 | Payer: MEDICARE

## 2019-03-25 DIAGNOSIS — I480 Paroxysmal atrial fibrillation: Principal | ICD-10-CM

## 2019-03-31 ENCOUNTER — Encounter: Admit: 2019-03-31 | Discharge: 2019-03-31 | Payer: MEDICARE

## 2019-04-08 ENCOUNTER — Encounter: Admit: 2019-04-08 | Discharge: 2019-04-08 | Payer: MEDICARE

## 2019-04-22 ENCOUNTER — Encounter: Admit: 2019-04-22 | Discharge: 2019-04-22

## 2019-04-22 DIAGNOSIS — I480 Paroxysmal atrial fibrillation: Secondary | ICD-10-CM

## 2019-04-22 NOTE — Progress Notes
Per notes, no need to call if within therapeutic range.

## 2019-04-25 ENCOUNTER — Encounter: Admit: 2019-04-25 | Discharge: 2019-04-25

## 2019-04-25 ENCOUNTER — Ambulatory Visit: Admit: 2019-04-25 | Discharge: 2019-04-26

## 2019-04-25 DIAGNOSIS — I480 Paroxysmal atrial fibrillation: Secondary | ICD-10-CM

## 2019-04-25 DIAGNOSIS — E669 Obesity, unspecified: Secondary | ICD-10-CM

## 2019-04-25 DIAGNOSIS — I444 Left anterior fascicular block: Secondary | ICD-10-CM

## 2019-04-25 DIAGNOSIS — I495 Sick sinus syndrome: Secondary | ICD-10-CM

## 2019-04-25 DIAGNOSIS — I2510 Atherosclerotic heart disease of native coronary artery without angina pectoris: Secondary | ICD-10-CM

## 2019-04-25 DIAGNOSIS — Z950 Presence of cardiac pacemaker: Secondary | ICD-10-CM

## 2019-04-25 DIAGNOSIS — I493 Ventricular premature depolarization: Secondary | ICD-10-CM

## 2019-04-25 DIAGNOSIS — G4733 Obstructive sleep apnea (adult) (pediatric): Secondary | ICD-10-CM

## 2019-04-25 DIAGNOSIS — I952 Hypotension due to drugs: Secondary | ICD-10-CM

## 2019-04-25 MED ORDER — PERFLUTREN LIPID MICROSPHERES 1.1 MG/ML IV SUSP
1-20 mL | Freq: Once | INTRAVENOUS | 0 refills | Status: CP | PRN
Start: 2019-04-25 — End: ?

## 2019-05-02 ENCOUNTER — Encounter: Admit: 2019-05-02 | Discharge: 2019-05-02

## 2019-05-02 NOTE — Telephone Encounter
-----   Message from Vertell Novak, MD sent at 05/01/2019  6:26 PM CDT -----  Dear Ottie Glazier call Mr. Markey and let him know that the echocardiogram showed normal heart function, overall unremarkable.Thank you  ----- Message -----  From: Vertell Novak, MD  Sent: 04/25/2019  12:33 PM CDT  To: Vertell Novak, MD

## 2019-05-02 NOTE — Telephone Encounter
05/02/19 called and left message for pt to call back to make sure we have all the records we need for his appt.   Need to know if pt has seen pcp or has been seen any where else.   Left message edh

## 2019-05-02 NOTE — Telephone Encounter
Pt notified of results and recommendations and verbalized understanding.  Pt will f/u as scheduled.

## 2019-05-03 ENCOUNTER — Encounter: Admit: 2019-05-03 | Discharge: 2019-05-03

## 2019-05-03 ENCOUNTER — Ambulatory Visit: Admit: 2019-05-03 | Discharge: 2019-05-04

## 2019-05-03 DIAGNOSIS — I480 Paroxysmal atrial fibrillation: Secondary | ICD-10-CM

## 2019-05-03 DIAGNOSIS — I952 Hypotension due to drugs: Secondary | ICD-10-CM

## 2019-05-03 DIAGNOSIS — E669 Obesity, unspecified: Secondary | ICD-10-CM

## 2019-05-03 DIAGNOSIS — Z7901 Long term (current) use of anticoagulants: Secondary | ICD-10-CM

## 2019-05-03 DIAGNOSIS — I495 Sick sinus syndrome: Secondary | ICD-10-CM

## 2019-05-03 DIAGNOSIS — I444 Left anterior fascicular block: Secondary | ICD-10-CM

## 2019-05-03 DIAGNOSIS — I493 Ventricular premature depolarization: Secondary | ICD-10-CM

## 2019-05-03 DIAGNOSIS — I2510 Atherosclerotic heart disease of native coronary artery without angina pectoris: Secondary | ICD-10-CM

## 2019-05-03 DIAGNOSIS — Z950 Presence of cardiac pacemaker: Secondary | ICD-10-CM

## 2019-05-03 DIAGNOSIS — E782 Mixed hyperlipidemia: Secondary | ICD-10-CM

## 2019-05-03 DIAGNOSIS — G4730 Sleep apnea, unspecified: Secondary | ICD-10-CM

## 2019-05-03 DIAGNOSIS — G629 Polyneuropathy, unspecified: Secondary | ICD-10-CM

## 2019-05-03 DIAGNOSIS — E785 Hyperlipidemia, unspecified: Secondary | ICD-10-CM

## 2019-05-03 DIAGNOSIS — I4891 Unspecified atrial fibrillation: Secondary | ICD-10-CM

## 2019-05-03 MED ORDER — WARFARIN 4 MG PO TAB
ORAL_TABLET | ORAL | 3 refills | 90.00000 days | Status: AC
Start: 2019-05-03 — End: ?

## 2019-05-03 MED ORDER — METOPROLOL SUCCINATE 50 MG PO TB24
50 mg | ORAL_TABLET | Freq: Two times a day (BID) | ORAL | 3 refills | 90.00000 days | Status: AC
Start: 2019-05-03 — End: ?

## 2019-05-03 MED ORDER — ATORVASTATIN 20 MG PO TAB
20 mg | ORAL_TABLET | Freq: Every day | ORAL | 3 refills | Status: AC
Start: 2019-05-03 — End: ?

## 2019-05-03 MED ORDER — DILTIAZEM HCL 180 MG PO CP24
180 mg | ORAL_CAPSULE | Freq: Two times a day (BID) | ORAL | 3 refills | 45.00000 days | Status: AC
Start: 2019-05-03 — End: ?

## 2019-05-03 MED ORDER — NITROGLYCERIN 0.4 MG SL SUBL
ORAL_TABLET | 6 refills | Status: CN | PRN
Start: 2019-05-03 — End: ?

## 2019-05-03 MED ORDER — NITROGLYCERIN 0.4 MG SL SUBL
ORAL_TABLET | SUBLINGUAL | 6 refills | 9.00000 days | Status: AC | PRN
Start: 2019-05-03 — End: ?

## 2019-05-03 NOTE — Progress Notes
Date of Service: 05/03/2019    Robert Mercado is a 73 y.o. male.       HPI     Robert Mercado is a very pleasant 73 year old white male that has been a patient with [facility] since February 2011.  The patient does have a history of coronary artery disease, the most recent left heart catheterization was performed on 11/11/2016 and performed at [facility], he was found to have minimally obstructive CAD.    In the past patient did undergo PTCA of the LAD in April 2010 and PCI of the diagonal branch at [facility] in [place] in February 2013.    Patient has history of cardiomyopathy and did undergo a PVC ablation (this were RVOT PVCs and LV apical septal PVCs) in February 2018, prior to this procedure he was found to have an ejection fraction of 43% on a stress test performed in December 2017.    The most recent echocardiographic evaluation dated April 25, 2019 demonstrated an ejection fraction of 55%, grade 1 diastolic dysfunction, the RV was mildly dilated with mildly decreased systolic function but normal TAPSE at 2.0 cm, there are no significant valvular abnormalities, the sinuses of Valsalva and the proximal ascending aorta were only mildly dilated with calcified plaque noted to be present in the noncoronary sinus, the PA pressure was normal at 29 mmHg.    Patient also has a history of sick sinus syndrome and he underwent previous device therapy in July 2011.    Due to history of paroxysmal atrial fibrillation patient has been anticoagulated with a BKA.    He is doing well from a cardiac standpoint without symptoms of chest pain or heart palpitations.  In the past Robert Mercado did have issues with hypotension but currently his blood pressure is under good control.    He was recently admitted and evaluated in the emergency room department in [place], [facility] due to an episode of food poisoning that occurred after eating out.         Vitals: 05/03/19 1357 05/03/19 1407   BP: 120/78 118/74   BP Source: Arm, Left Upper Arm, Right Upper   Pulse: 72    SpO2: 98%    Weight: 129.3 kg (285 lb)    Height: 1.854 m (6' 1)    PainSc: Zero      Body mass index is 37.6 kg/m???.     Past Medical History  Patient Active Problem List    Diagnosis Date Noted   ??? OSA (obstructive sleep apnea) 02/22/2019   ??? Hospital discharge follow-up 08/09/2018   ??? 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 Chronic fatigue 04/01/2018   ??? Tenosynovitis, de Quervain 10/21/2017   ??? PVC (premature ventricular contraction) 12/31/2016   ??? PVC's (premature ventricular contractions) 11/06/2016     0214/18 PVC ablation - x 3 morphologies RVOT      ??? Preoperative cardiovascular examination 04/28/2016   ??? Hospital discharge follow-up 03/10/2016   ??? Chronic fatigue 08/21/2015   ??? Hypotension due to drugs 08/21/2015   ??? 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 Chronic anticoagulation, on warfarin 08/16/2014   ??? OSA (obstructive sleep apnea) 08/17/2013   ??? Obesity (BMI 30-39.9) 08/17/2013   ??? Paroxysmal atrial fibrillation (HCC) 04/19/2012   ??? 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 Atypical chest pain 05/02/2011   ??? SSS (sick sinus syndrome) (HCC) 05/21/2010   ???  HLD (hyperlipidemia) 11/12/2009   ??? CAD (coronary artery disease) 11/12/2009     08/16/14: Cath by Dr. Mackey Birchwood: 30% instent restenosis of previously placed stent in the LAD, and patent 2nd DIAG stent and 60% distal LAD disease. Medical management     ??? Peripheral neuropathy 11/12/2009         Review of Systems   Constitution: Negative.   HENT: Negative.    Eyes: Negative.    Cardiovascular: Negative.    Respiratory: Negative.    Endocrine: Positive for heat intolerance.   Hematologic/Lymphatic: Bruises/bleeds easily.   Skin: Positive for dry skin, itching and rash.   Musculoskeletal: Negative.    Gastrointestinal: Negative.    Genitourinary: Negative. Neurological: Negative.    Psychiatric/Behavioral: Negative.    Allergic/Immunologic: Negative.        Physical Exam  General Appearance: obese  Skin: warm, moist, no ulcers or xanthomas  Eyes: conjunctivae and lids normal, pupils are equal and round  Lips & Oral Mucosa: no pallor or cyanosis  Neck Veins: neck veins are flat, neck veins are not distended  Chest Inspection: chest is normal in appearance  Respiratory Effort: breathing comfortably, no respiratory distress  Auscultation/Percussion: lungs clear to auscultation, no rales or rhonchi, no wheezing  Cardiac Rhythm: regular rhythm and normal rate  Cardiac Auscultation: S1, S2 normal, no rub, no gallop  Murmurs: no murmur  Carotid Arteries: normal carotid upstroke bilaterally, no bruit  Abdominal aorta: could not be examined due to obese adomen  Lower Extremity Edema: no lower extremity edema  Abdominal Exam: soft, non-tender, no masses, bowel sounds normal  Liver & Spleen: no organomegaly  Language and Memory: patient responsive and seems to comprehend information  Neurologic Exam: neurological assessment grossly intact      Cardiovascular Studies  Twelve-lead EKG demonstrates normal sinus rhythm, RBBB, LAD, LAFB, first-degree AV block with a PR interval of 232 ms, QT 484 ms, QTC 492 ms.    Problems Addressed Today  Encounter Diagnoses   Name Primary?   ??? SSS (sick sinus syndrome) (HCC) Yes   ??? PVC's (premature ventricular contractions)    ??? PVC (premature ventricular contraction)    ??? Paroxysmal atrial fibrillation (HCC)    ??? LAFB (left anterior fascicular block)    ??? Hypotension due to drugs    ??? Mixed hyperlipidemia    ??? Coronary artery disease involving native coronary artery of native heart without angina pectoris    ??? Obesity (BMI 30-39.9)        Assessment and Plan     In summary:  This is a 73 year old white male with the following cardiovascular issues    1.  Coronary artery disease ??? status post PTCA of the LAD in April 2010 and s/p a PCI of the diagonal branch performed at [facility] in [place], [place] in February 2013  ??? Most recent left heart catheterization dated 11/11/2016 performed at [facility] did not reveal any significantly obstructive CAD    2.  History of cardiomyopathy  ??? Ejection fraction was depressed in the moderate range with an EF of approximately 40-45%  ??? 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 Patient did undergo RFA of the RVOT PVCs in February 2018  ??? Patient was taken off ramipril at the previous telehealth/video visit on 02/22/2019 due to relative hypotension  ??? He continues on beta-blocker, the most recent echocardiogram dated 04/25/2019 demonstrated normal left ventricular systolic function LVEF =  55%    3.  History of sick sinus syndrome  ??? Patient underwent device placement in July 2011  ??? The device function is normal    4.  History of paroxysmal atrial fibrillation  ??? Patient remained anticoagulated with a BKA  ??? We did discuss in the past about initiating a factor Xa inhibitor, however he preferred to remain on warfarin, he does have an INR machine and checks it at home  ??? The most recent pacemaker check dated 03/23/2019 did not demonstrate any significant atrial arrhythmias  ??? At the time the PVC burden was 118.3/h, patient is asymptomatic with this    Plan:    1.  I asked the patient to continue all current medications  2.  He will relocated to [facility], [place] and there he will establish care with a primary care care physician as well as a cardiologist  3.  We will renew all his cardiac medications  4.  We will be available to forward cardiac records once the patient is established with physicians in [place].    Should have any questions feel free to call our office at [phone] or my cell phone [phone].           Current Medications (including today's revisions) ??? aspirin EC 81 mg tablet Take 81 mg by mouth daily.   ??? atorvastatin (LIPITOR) 20 mg tablet TAKE 1 TABLET EVERY DAY   ??? betamethasone dipropionate 0.05 % topical cream Apply  topically to affected area twice daily.   ??? dilTIAZem CD (CARDIZEM CD) 180 mg capsule Take one capsule by mouth twice daily.   ??? finasteride (PROSCAR) 5 mg tablet Take 5 mg by mouth daily.   ??? fluticasone (FLONASE) 50 mcg/actuation nasal spray Apply  to each nostril as directed daily. Shake bottle gently before using.   ??? gabapentin (NEURONTIN) 300 mg capsule Take 300 mg by mouth three times daily.   ??? gabapentin (NEURONTIN) 600 mg PO tablet Take 600 mg by mouth Three Times Daily.   ??? HYDROcodone-acetaminophen(+) (NORCO) 7.5-325 mg tablet Take 1 Tab by mouth every 6 hours as needed.   ??? lidocaine (LIDODERM) 5 % topical patch Apply one patch topically to affected area every 24 hours. Apply patch for 12 hours, then remove for 12 hours before repeating.   ??? MELATONIN PO Take  by mouth.   ??? metoprolol XL (TOPROL XL) 50 mg extended release tablet TAKE ONE TABLET BY MOUTH TWICE DAILY.   ??? nitroglycerin (NITROSTAT) 0.4 mg tablet Take one tablet sublingually as needed every 5 min for chest pain X three   ??? ondansetron (ZOFRAN) 4 mg tablet Take 4 mg by mouth as Needed.   ??? pantoprazole DR (PROTONIX) 20 mg tablet Take 40 mg by mouth twice daily.   ??? traMADol (ULTRAM) 50 mg tablet Take 50 mg by mouth every 6 hours as needed for Pain.   ??? vitamins, B complex PO Tab Take 1 Tab by mouth Daily.   ??? vitamins, multiple PO Cap Take 1 Cap by mouth Daily.   ??? warfarin (COUMADIN) 4 mg tablet Take 1-2 tabs as directed by [facility]

## 2019-05-04 ENCOUNTER — Encounter: Admit: 2019-05-04 | Discharge: 2019-05-04

## 2019-05-06 ENCOUNTER — Encounter: Admit: 2019-05-06 | Discharge: 2019-05-06

## 2019-05-06 DIAGNOSIS — I480 Paroxysmal atrial fibrillation: Secondary | ICD-10-CM

## 2019-05-23 ENCOUNTER — Encounter: Admit: 2019-05-23 | Discharge: 2019-05-23

## 2019-05-23 DIAGNOSIS — I480 Paroxysmal atrial fibrillation: Secondary | ICD-10-CM

## 2019-06-03 ENCOUNTER — Encounter: Admit: 2019-06-03 | Discharge: 2019-06-03

## 2019-06-03 NOTE — Progress Notes
Spoke to [place] re: INR results. Verified current Coumadin dose. Pt states with recent move he hasn't been eating consistent meals and thinks the elevation is likely due to dietary changes. Pt will decrease dose slightly and recheck INR next week. Robert Mercado verbalized understanding and was agreeable to this plan. Additionally, when I tried to reach him on home # listed [phone]) it was disconnected. Pt confirms we should use only cell # at this time. No further needs identified at this time.

## 2019-06-17 ENCOUNTER — Encounter: Admit: 2019-06-17 | Discharge: 2019-06-17

## 2019-06-17 DIAGNOSIS — I480 Paroxysmal atrial fibrillation: Secondary | ICD-10-CM

## 2019-06-17 NOTE — Progress Notes
Pt told me he has been taking warfarin 2 mg on Monday and Thursday (instead of MWF) and 4 mg on the other days "for a long time." Asked him to continue this dose and recheck in 2 weeks. He verbalizes understanding.

## 2019-07-01 ENCOUNTER — Encounter: Admit: 2019-07-01 | Discharge: 2019-07-01

## 2019-07-12 ENCOUNTER — Encounter: Admit: 2019-07-12 | Discharge: 2019-07-12

## 2019-07-22 ENCOUNTER — Encounter: Admit: 2019-07-22 | Discharge: 2019-07-22

## 2019-08-02 ENCOUNTER — Ambulatory Visit: Admit: 2019-08-02 | Discharge: 2019-08-03 | Payer: MEDICARE

## 2019-08-05 ENCOUNTER — Encounter: Admit: 2019-08-05 | Discharge: 2019-08-05 | Payer: MEDICARE

## 2019-08-08 ENCOUNTER — Encounter: Admit: 2019-08-08 | Discharge: 2019-08-08 | Payer: MEDICARE

## 2019-09-13 ENCOUNTER — Encounter: Admit: 2019-09-13 | Discharge: 2019-09-13 | Payer: MEDICARE

## 2020-01-21 IMAGING — CR CHEST
1 series · 1 of 1 positions shown · non-contrast
Comparison: none

[chest port x-wise]
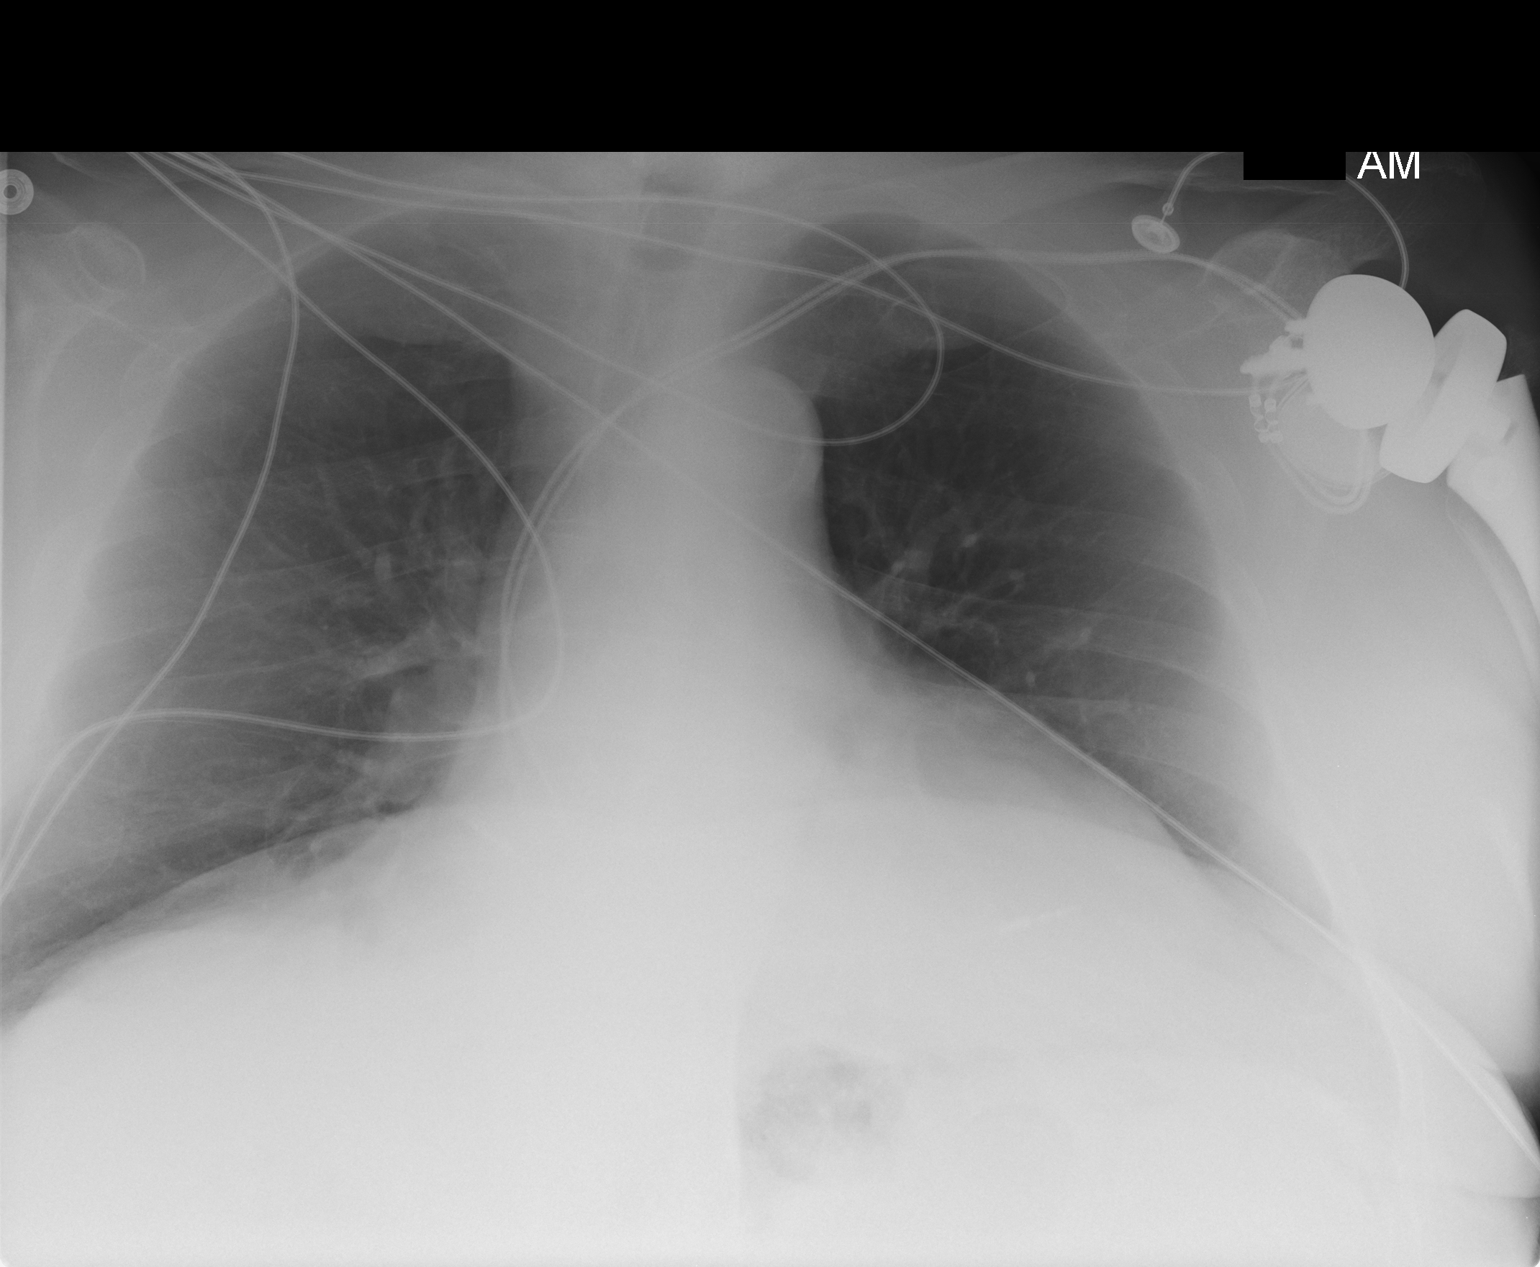

[1 of 1 positions shown; findings below may reference images not displayed]

DIAGNOSTIC STUDIES

EXAM
RADIOLOGICAL EXAMINATION, CHEST; SINGLE VIEW, FRONTAL CPT 99090

INDICATION
Chest pain
Patient reports chest discomfort. BG

TECHNIQUE
Single AP view chest

COMPARISONS
Single-view chest September 20, 2016

FINDINGS
There is no focal consolidation, effusion, or pneumothorax. Cardiac silhouette is mildly prominent.
There is dual chamber pacer. The bony thorax is intact. There is a left total shoulder arthroplasty.

IMPRESSION
Mildly limited by patient body habitus without evidence of definite acute cardiopulmonary
abnormality.

Tech Notes:

Patient reports chest discomfort.  BG

## 2020-05-23 ENCOUNTER — Encounter: Admit: 2020-05-23 | Discharge: 2020-05-23 | Payer: MEDICARE

## 2020-05-23 MED ORDER — ATORVASTATIN 20 MG PO TAB
ORAL_TABLET | Freq: Every day | 3 refills
Start: 2020-05-23 — End: ?

## 2020-10-07 IMAGING — CR CHEST
2 series · 2 of 2 positions shown · non-contrast
Comparison: none

[chest pa x-wise]
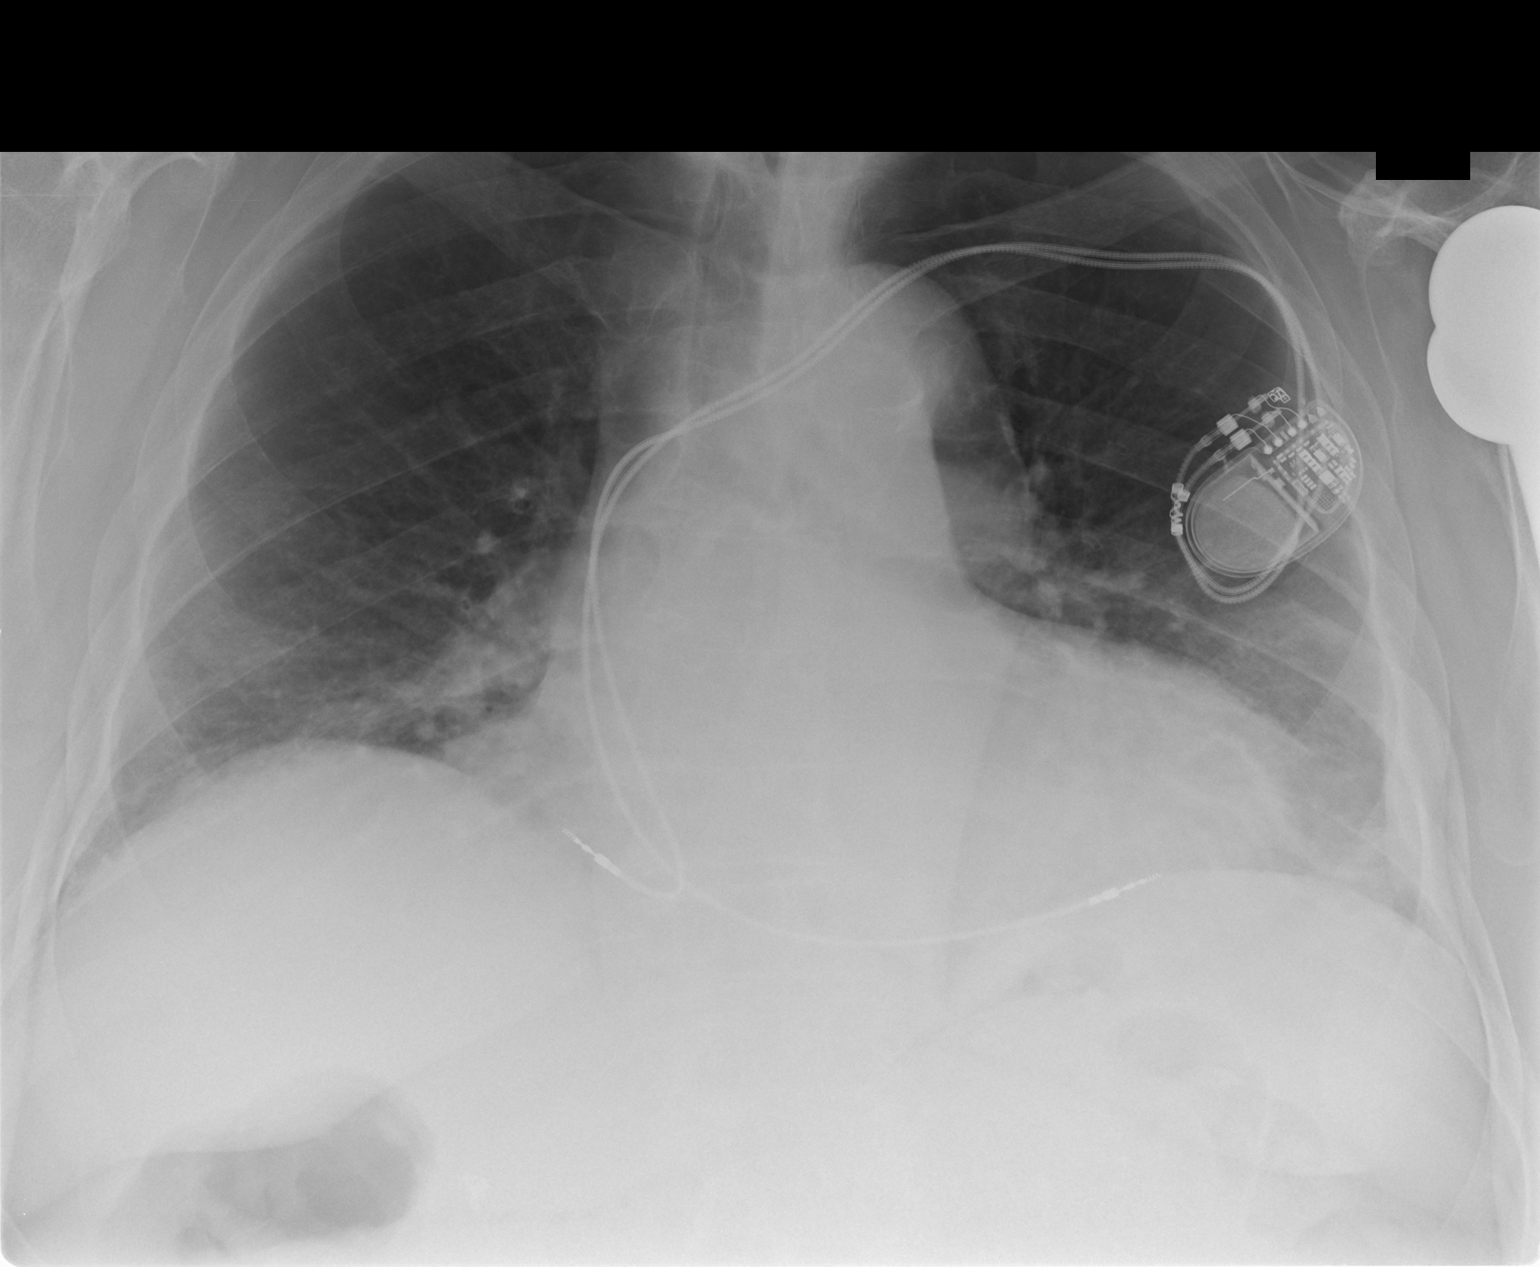

[chest lat]
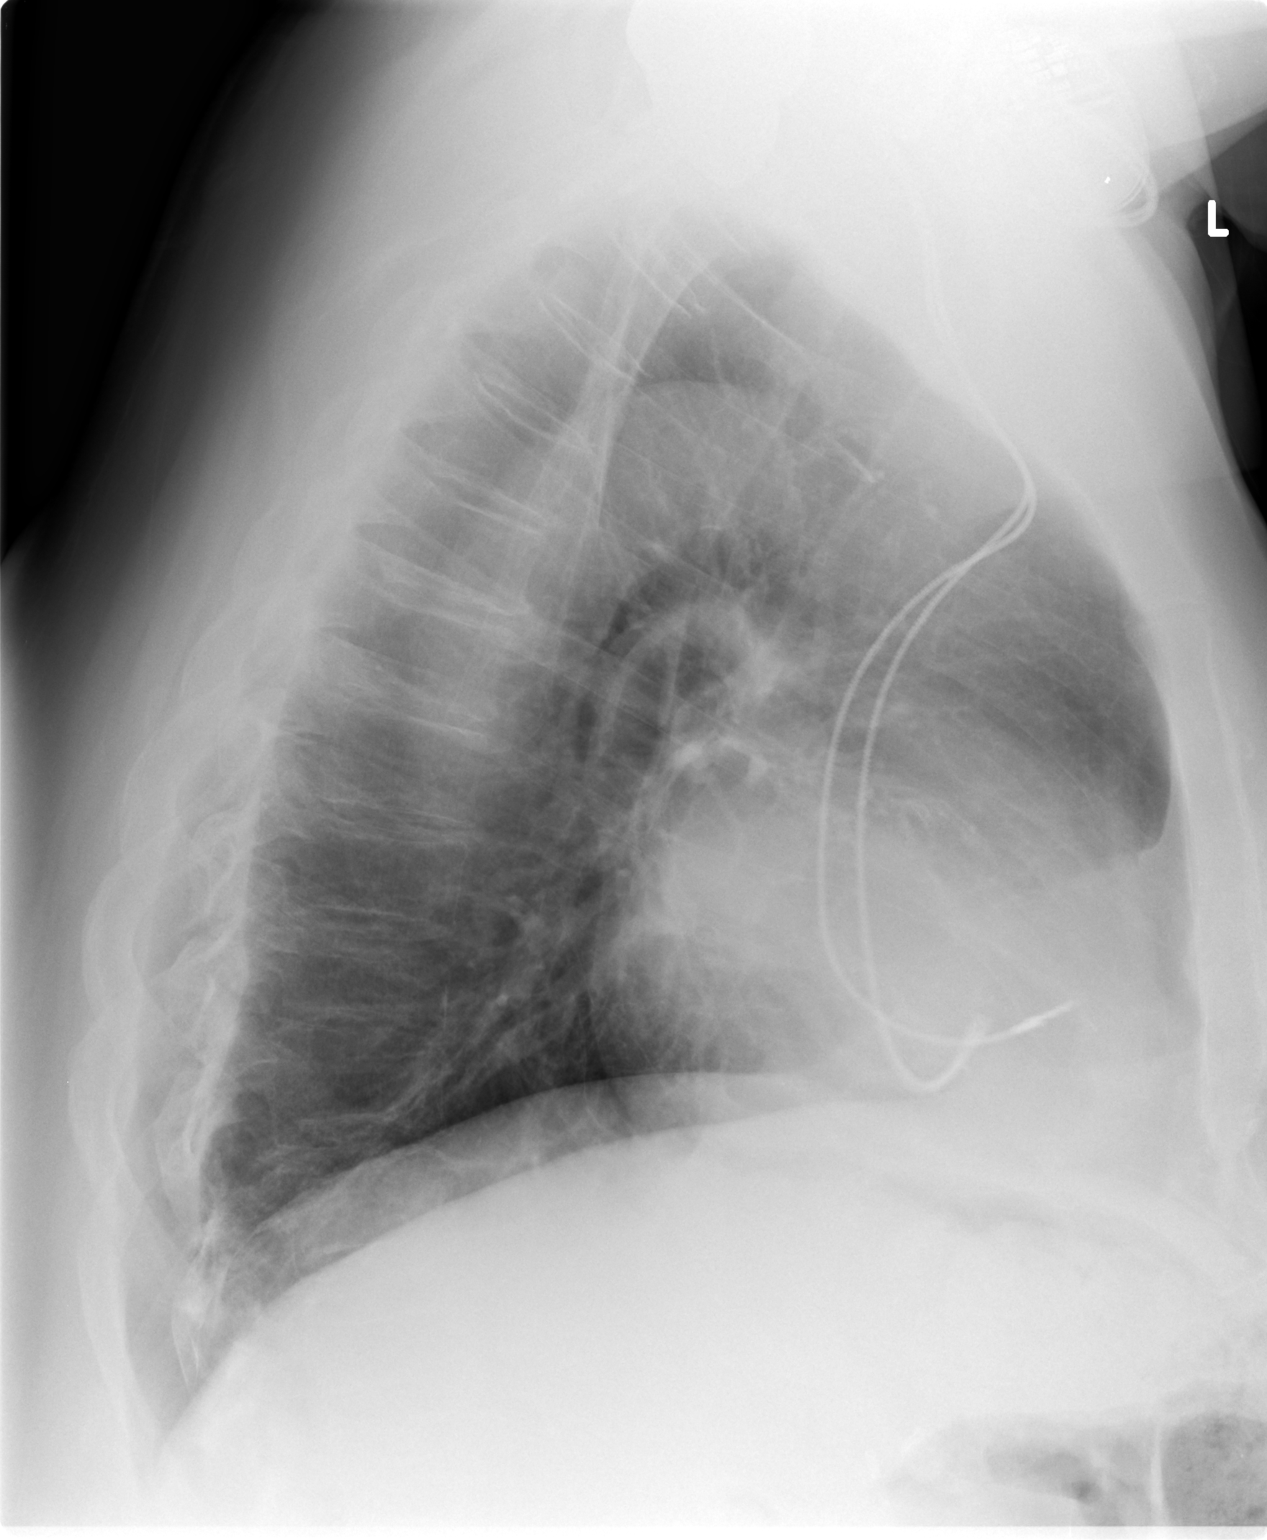

[2 of 2 positions shown; findings below may reference images not displayed]

EXAM
AP and lateral views of the chest. Oblique views of the left ribs.

INDICATION
cough
FELL A FEW DAYS AGO ON LEFT RIBS. COUGH FEVER. PAIN. LM

TECHNIQUE
AP and lateral views of the chest. Oblique views of the left ribs.

COMPARISONS
August 02, 2018

FINDINGS
Cardiomegaly. No evidence of failure. Mild bibasilar subsegmental atelectasis. No pleural effusion.
No acute osseous abnormality. Left approach ICD with right atrial and right ventricular leads.
Partially imaged left total shoulder arthroplasty.

IMPRESSION
No acute osseous abnormality.
Mild bibasilar linear opacities, favor atelectasis.
Mild cardiomegaly.

Tech Notes:

FELL A FEW DAYS AGO ON LEFT RIBS. COUGH FEVER. PAIN. LM

## 2020-10-07 IMAGING — CR CHEST
3 series · 3 of 3 positions shown · non-contrast
Comparison: none

[rib upper]
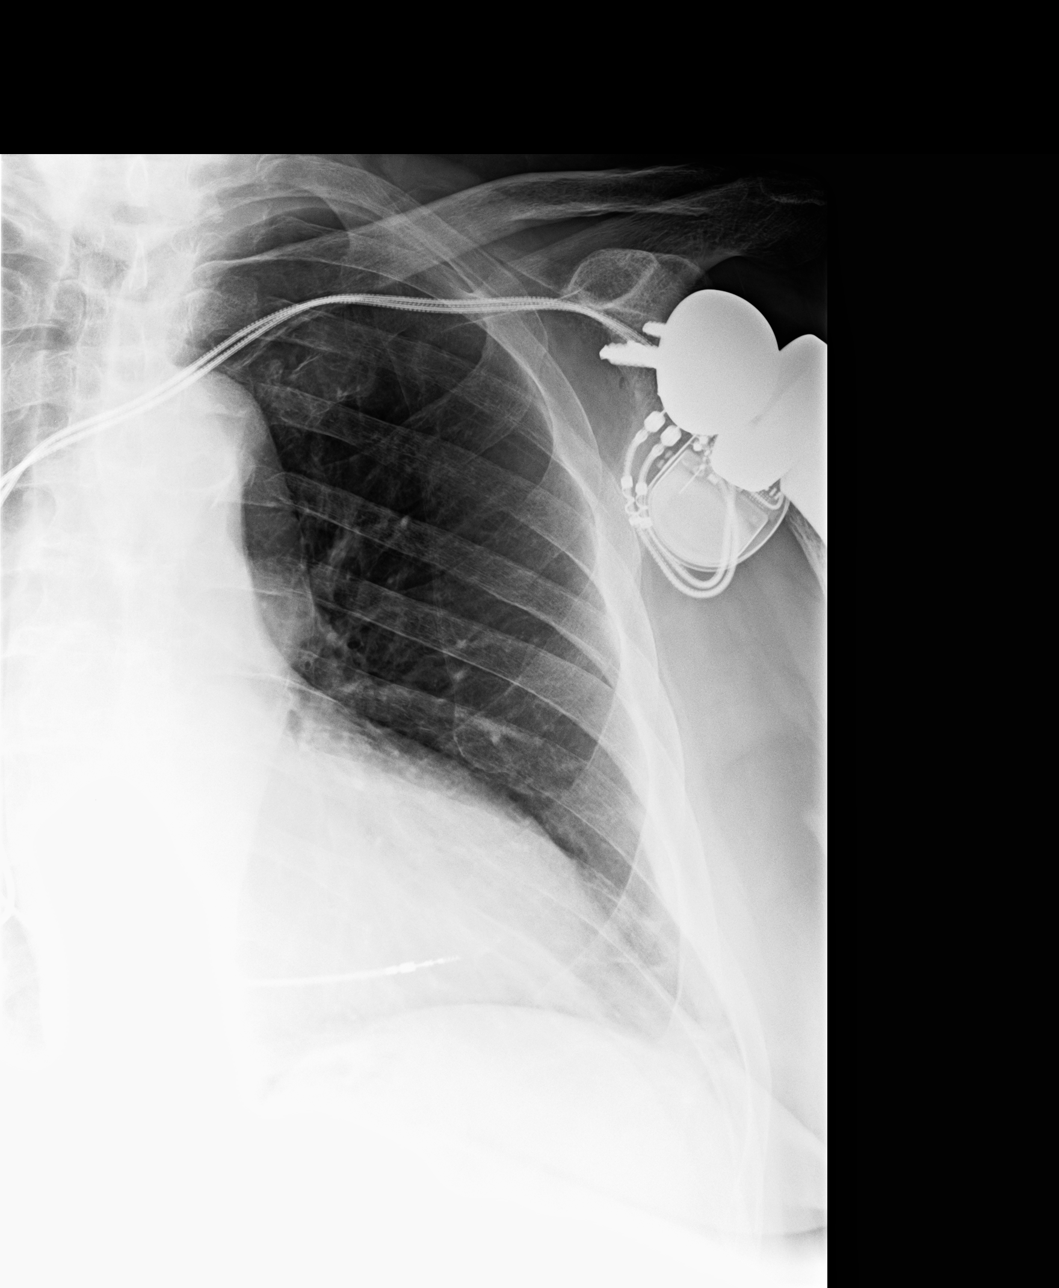

[rib upper obl]
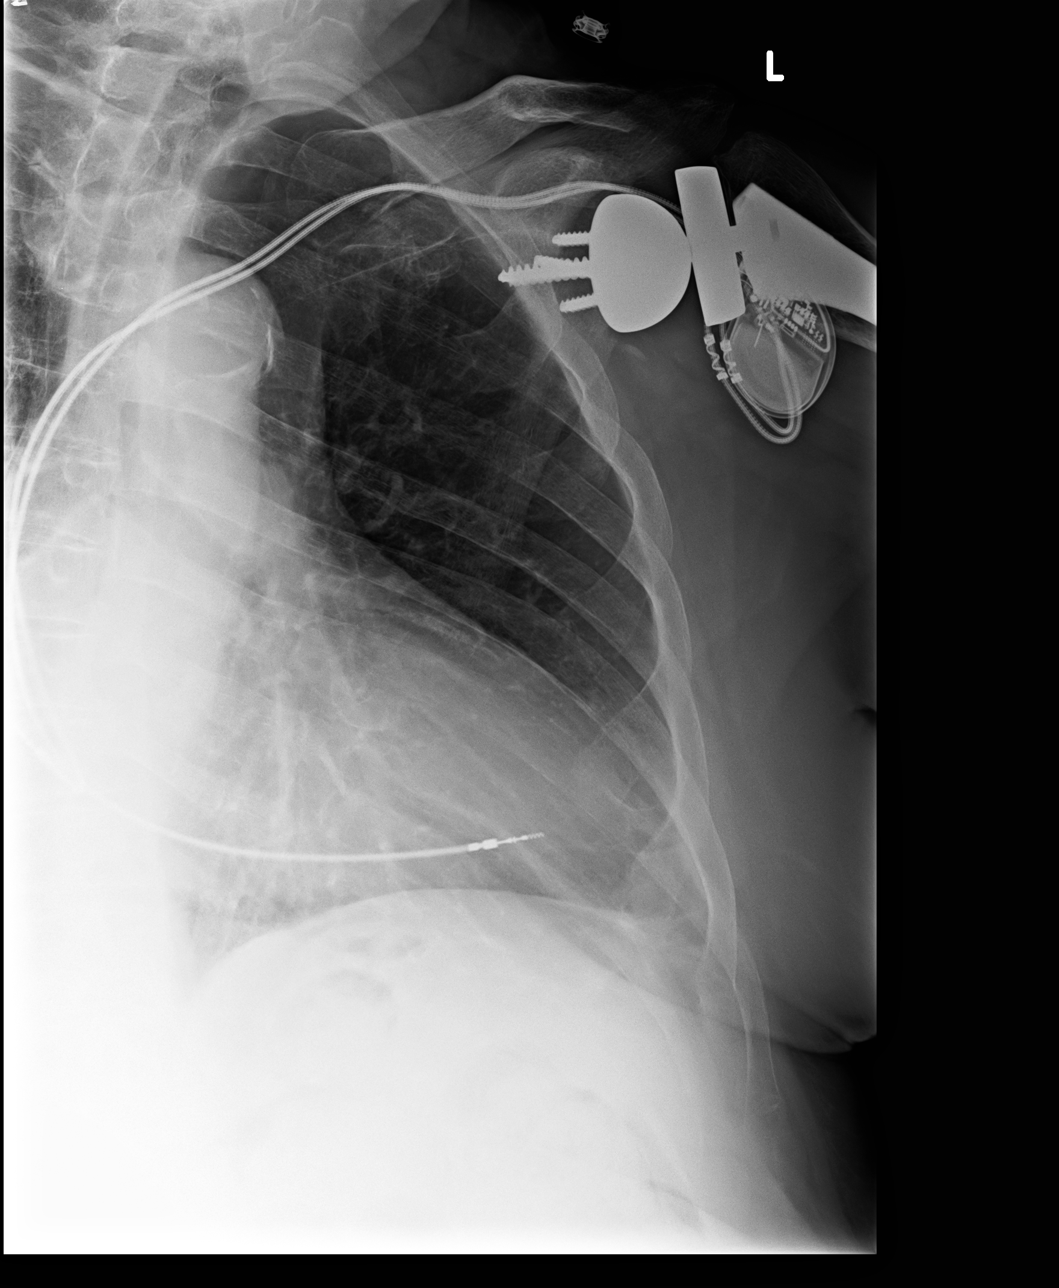

[rib lower]
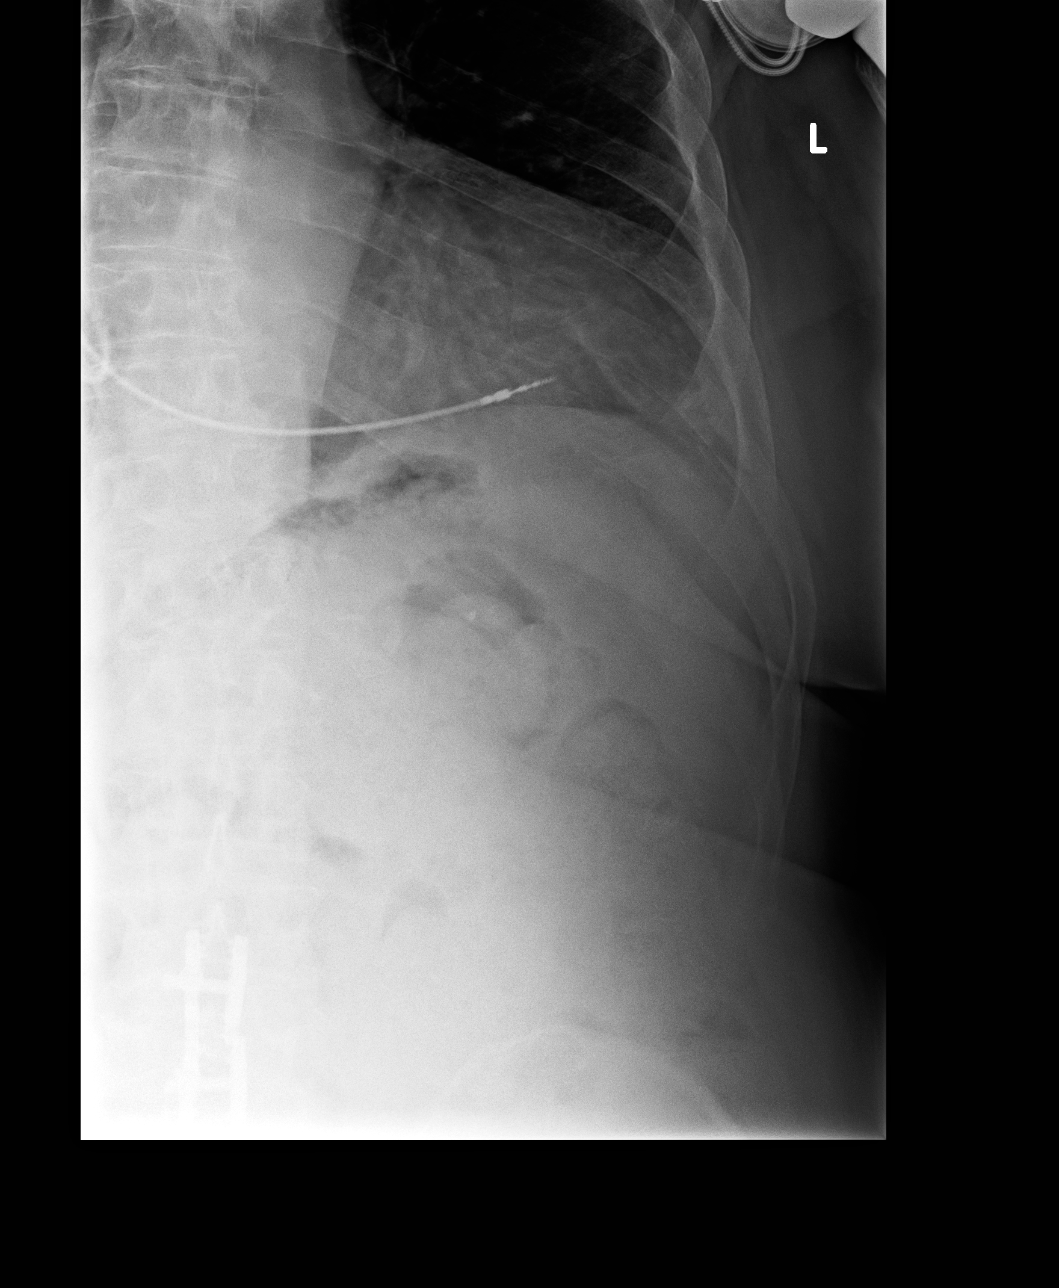

[3 of 3 positions shown; findings below may reference images not displayed]

EXAM
AP and lateral views of the chest. Oblique views of the left ribs.

INDICATION
cough
FELL A FEW DAYS AGO ON LEFT RIBS. COUGH FEVER. PAIN. LM

TECHNIQUE
AP and lateral views of the chest. Oblique views of the left ribs.

COMPARISONS
August 02, 2018

FINDINGS
Cardiomegaly. No evidence of failure. Mild bibasilar subsegmental atelectasis. No pleural effusion.
No acute osseous abnormality. Left approach ICD with right atrial and right ventricular leads.
Partially imaged left total shoulder arthroplasty.

IMPRESSION
No acute osseous abnormality.
Mild bibasilar linear opacities, favor atelectasis.
Mild cardiomegaly.

Tech Notes:

FELL A FEW DAYS AGO ON LEFT RIBS. COUGH FEVER. PAIN. LM

## 2021-06-26 ENCOUNTER — Encounter: Admit: 2021-06-26 | Discharge: 2021-06-26 | Payer: MEDICARE

## 2021-07-23 ENCOUNTER — Encounter: Admit: 2021-07-23 | Discharge: 2021-07-23 | Payer: MEDICARE

## 2021-07-24 ENCOUNTER — Encounter: Admit: 2021-07-24 | Discharge: 2021-07-24 | Payer: MEDICARE

## 2021-07-24 NOTE — Telephone Encounter
Request for transfer has been submitted. BC

## 2021-09-09 ENCOUNTER — Encounter: Admit: 2021-09-09 | Discharge: 2021-09-09 | Payer: MEDICARE

## 2021-09-09 DIAGNOSIS — Z950 Presence of cardiac pacemaker: Secondary | ICD-10-CM

## 2021-09-09 DIAGNOSIS — I495 Sick sinus syndrome: Secondary | ICD-10-CM

## 2021-09-10 ENCOUNTER — Encounter: Admit: 2021-09-10 | Discharge: 2021-09-10 | Payer: MEDICARE

## 2021-09-10 NOTE — Telephone Encounter
-----   Message from Cammy Brochure, RN sent at 09/09/2021  2:50 PM CDT -----  Regarding: Remote Results - [facility]  Patient initiated transmission was received on 10/20 for unknown reason. Patient had almost 8 hours of Atrial Fibrillation on 07/31/21. Per medication list the patient is on OAC, but might want to check in to confirm since he hasn't been seen in awhile. Thank you.

## 2021-09-26 ENCOUNTER — Ambulatory Visit: Admit: 2021-09-26 | Discharge: 2021-09-26 | Payer: MEDICARE

## 2021-09-26 ENCOUNTER — Encounter: Admit: 2021-09-26 | Discharge: 2021-09-26 | Payer: MEDICARE

## 2021-09-26 DIAGNOSIS — I495 Sick sinus syndrome: Secondary | ICD-10-CM

## 2021-09-26 DIAGNOSIS — Z950 Presence of cardiac pacemaker: Secondary | ICD-10-CM

## 2021-10-16 ENCOUNTER — Encounter: Admit: 2021-10-16 | Discharge: 2021-10-16 | Payer: MEDICARE

## 2021-10-17 ENCOUNTER — Encounter: Admit: 2021-10-17 | Discharge: 2021-10-17 | Payer: MEDICARE

## 2021-10-17 DIAGNOSIS — E669 Obesity, unspecified: Secondary | ICD-10-CM

## 2021-10-17 DIAGNOSIS — E785 Hyperlipidemia, unspecified: Secondary | ICD-10-CM

## 2021-10-17 DIAGNOSIS — I444 Left anterior fascicular block: Secondary | ICD-10-CM

## 2021-10-17 DIAGNOSIS — I2510 Atherosclerotic heart disease of native coronary artery without angina pectoris: Secondary | ICD-10-CM

## 2021-10-17 DIAGNOSIS — G4733 Obstructive sleep apnea (adult) (pediatric): Secondary | ICD-10-CM

## 2021-10-17 DIAGNOSIS — R0989 Other specified symptoms and signs involving the circulatory and respiratory systems: Secondary | ICD-10-CM

## 2021-10-17 DIAGNOSIS — I5042 Chronic combined systolic (congestive) and diastolic (congestive) heart failure: Secondary | ICD-10-CM

## 2021-10-17 DIAGNOSIS — Z950 Presence of cardiac pacemaker: Secondary | ICD-10-CM

## 2021-10-17 DIAGNOSIS — G4730 Sleep apnea, unspecified: Secondary | ICD-10-CM

## 2021-10-17 DIAGNOSIS — I480 Paroxysmal atrial fibrillation: Secondary | ICD-10-CM

## 2021-10-17 DIAGNOSIS — I495 Sick sinus syndrome: Secondary | ICD-10-CM

## 2021-10-17 DIAGNOSIS — I4891 Unspecified atrial fibrillation: Secondary | ICD-10-CM

## 2021-10-17 DIAGNOSIS — E782 Mixed hyperlipidemia: Secondary | ICD-10-CM

## 2021-10-17 DIAGNOSIS — Z7901 Long term (current) use of anticoagulants: Secondary | ICD-10-CM

## 2021-10-17 DIAGNOSIS — Z01810 Encounter for preprocedural cardiovascular examination: Secondary | ICD-10-CM

## 2021-10-17 DIAGNOSIS — U071 COVID-19 virus infection: Secondary | ICD-10-CM

## 2021-10-17 DIAGNOSIS — I952 Hypotension due to drugs: Secondary | ICD-10-CM

## 2021-10-17 DIAGNOSIS — I493 Ventricular premature depolarization: Secondary | ICD-10-CM

## 2021-10-17 DIAGNOSIS — G629 Polyneuropathy, unspecified: Secondary | ICD-10-CM

## 2021-10-17 MED ORDER — POTASSIUM CHLORIDE 10 MEQ PO CPER
20 meq | ORAL_CAPSULE | Freq: Every day | ORAL | 1 refills | 30.00000 days | Status: AC
Start: 2021-10-17 — End: ?

## 2021-10-17 MED ORDER — TORSEMIDE 20 MG PO TAB
20 mg | ORAL_TABLET | Freq: Every day | ORAL | 1 refills | 67.50000 days | Status: AC
Start: 2021-10-17 — End: ?

## 2021-10-17 NOTE — Patient Instructions
Thank you for visiting our office today.    We would like to make the following medication adjustments:      Take torsemide 20 mg twice daily for 4 days.  Take 2 tablets of potassium 10 meq once a day for 4 days.       Otherwise continue the same medications as you have been doing.          We will be pursuing the following tests after your appointment today:       Orders Placed This Encounter    BASIC METABOLIC PANEL    ECG 12-LEAD    2D + DOPPLER ECHO    torsemide (DEMADEX) 20 mg tablet    potassium chloride (KLOR-CON SPRINKLE) 10 mEq capsule         Please call us in the meantime with any questions or concerns.        Please allow 5-7 business days for our providers to review your results. All normal results will go to MyChart. If you do not have Mychart, it is strongly recommended to get this so you can easily view all your results. If you do not have mychart, we will attempt to call you once with normal lab and testing results. If we cannot reach you by phone with normal results, we will send you a letter.  If you have not heard the results of your testing after one week please give [place] a call.       Your Cardiovascular Medicine [place] Team Brett Canales, Pilar Jarvis and [place])  phone number is [phone].

## 2021-10-23 ENCOUNTER — Encounter: Admit: 2021-10-23 | Discharge: 2021-10-23 | Payer: MEDICARE

## 2021-10-24 ENCOUNTER — Encounter: Admit: 2021-10-24 | Discharge: 2021-10-24 | Payer: MEDICARE

## 2021-10-24 DIAGNOSIS — Z950 Presence of cardiac pacemaker: Secondary | ICD-10-CM

## 2021-10-24 DIAGNOSIS — I5042 Chronic combined systolic (congestive) and diastolic (congestive) heart failure: Secondary | ICD-10-CM

## 2021-10-24 DIAGNOSIS — G4733 Obstructive sleep apnea (adult) (pediatric): Secondary | ICD-10-CM

## 2021-10-24 DIAGNOSIS — E782 Mixed hyperlipidemia: Secondary | ICD-10-CM

## 2021-10-24 DIAGNOSIS — I2510 Atherosclerotic heart disease of native coronary artery without angina pectoris: Secondary | ICD-10-CM

## 2021-10-24 DIAGNOSIS — I952 Hypotension due to drugs: Secondary | ICD-10-CM

## 2021-10-24 DIAGNOSIS — R0989 Other specified symptoms and signs involving the circulatory and respiratory systems: Secondary | ICD-10-CM

## 2021-10-24 DIAGNOSIS — I493 Ventricular premature depolarization: Secondary | ICD-10-CM

## 2021-10-24 DIAGNOSIS — I444 Left anterior fascicular block: Secondary | ICD-10-CM

## 2021-10-24 DIAGNOSIS — I495 Sick sinus syndrome: Secondary | ICD-10-CM

## 2021-10-24 LAB — BASIC METABOLIC PANEL
BLD UREA NITROGEN: 32 — ABNORMAL HIGH (ref 8.4–25.7)
CALCIUM: 9
CHLORIDE: 103
CO2: 24
CREATININE: 1.3 — ABNORMAL HIGH (ref 0.72–1.25)
GLUCOSE,PANEL: 103
POTASSIUM: 4
SODIUM: 136

## 2021-10-31 ENCOUNTER — Encounter: Admit: 2021-10-31 | Discharge: 2021-10-31 | Payer: MEDICARE

## 2021-10-31 ENCOUNTER — Ambulatory Visit: Admit: 2021-10-31 | Discharge: 2021-10-31 | Payer: MEDICARE

## 2021-10-31 DIAGNOSIS — I959 Hypotension, unspecified: Secondary | ICD-10-CM

## 2021-10-31 DIAGNOSIS — I444 Left anterior fascicular block: Secondary | ICD-10-CM

## 2021-10-31 DIAGNOSIS — E785 Hyperlipidemia, unspecified: Secondary | ICD-10-CM

## 2021-10-31 DIAGNOSIS — I25119 Atherosclerotic heart disease of native coronary artery with unspecified angina pectoris: Secondary | ICD-10-CM

## 2021-11-04 ENCOUNTER — Encounter: Admit: 2021-11-04 | Discharge: 2021-11-04 | Payer: MEDICARE

## 2021-11-04 DIAGNOSIS — I2510 Atherosclerotic heart disease of native coronary artery without angina pectoris: Secondary | ICD-10-CM

## 2021-11-04 DIAGNOSIS — I952 Hypotension due to drugs: Secondary | ICD-10-CM

## 2021-11-04 DIAGNOSIS — E669 Obesity, unspecified: Secondary | ICD-10-CM

## 2021-11-04 NOTE — Telephone Encounter
-----   Message from Dorris Fetch, MD sent at 11/02/2021  7:27 PM CST -----  Please call the patient and let him know that the echocardiogram showed normal heart function.  Aortic valve is sclerotic and calcified and it is tight perhaps in the mild-to-moderate range.  This is something that we will continue to monitor, he does not need anything to be done at this point in time.    Anticipated to see both Mr. Allyne Gee and his wife back in the office in approximately 6 months, we will decide then when to perform the next echocardiogram.    Thank you      ----- Message -----  From: Lucianne Muss, MD  Sent: 10/31/2021  12:53 PM CST  To: Dorris Fetch, MD

## 2021-12-04 ENCOUNTER — Encounter: Admit: 2021-12-04 | Discharge: 2021-12-04 | Payer: MEDICARE

## 2021-12-04 MED ORDER — ATORVASTATIN 20 MG PO TAB
20 mg | ORAL_TABLET | Freq: Every day | ORAL | 3 refills | Status: AC
Start: 2021-12-04 — End: ?

## 2021-12-17 ENCOUNTER — Encounter: Admit: 2021-12-17 | Discharge: 2021-12-17 | Payer: MEDICARE

## 2022-01-21 ENCOUNTER — Encounter: Admit: 2022-01-21 | Discharge: 2022-01-21 | Payer: MEDICARE

## 2022-02-10 ENCOUNTER — Encounter: Admit: 2022-02-10 | Discharge: 2022-02-10 | Payer: MEDICARE

## 2022-02-10 DIAGNOSIS — Z959 Presence of cardiac and vascular implant and graft, unspecified: Secondary | ICD-10-CM

## 2022-02-14 ENCOUNTER — Encounter: Admit: 2022-02-14 | Discharge: 2022-02-14 | Payer: MEDICARE

## 2022-02-14 NOTE — Progress Notes
Records Request    Medical records request for continuation of care:    Patient has appointment  with  Dr. Avie Arenas    Please fax records to [facility] [place] [facility] [phone]    Request records:    Minard Borjas  1946-04-27        Recent Labs (including most recent lipid)  Most recent Chest CT      Thank you,      Cardiovascular Medicine  [facility]  [address]  [place], [place] [address]  Phone:  [phone]  Fax:  [phone]

## 2022-02-18 ENCOUNTER — Encounter: Admit: 2022-02-18 | Discharge: 2022-02-18 | Payer: MEDICARE

## 2022-02-18 DIAGNOSIS — G4730 Sleep apnea, unspecified: Secondary | ICD-10-CM

## 2022-02-18 DIAGNOSIS — I952 Hypotension due to drugs: Secondary | ICD-10-CM

## 2022-02-18 DIAGNOSIS — I480 Paroxysmal atrial fibrillation: Secondary | ICD-10-CM

## 2022-02-18 DIAGNOSIS — I495 Sick sinus syndrome: Secondary | ICD-10-CM

## 2022-02-18 DIAGNOSIS — R600 Localized edema: Secondary | ICD-10-CM

## 2022-02-18 DIAGNOSIS — Z09 Encounter for follow-up examination after completed treatment for conditions other than malignant neoplasm: Secondary | ICD-10-CM

## 2022-02-18 DIAGNOSIS — E669 Obesity, unspecified: Secondary | ICD-10-CM

## 2022-02-18 DIAGNOSIS — I2510 Atherosclerotic heart disease of native coronary artery without angina pectoris: Secondary | ICD-10-CM

## 2022-02-18 DIAGNOSIS — E782 Mixed hyperlipidemia: Secondary | ICD-10-CM

## 2022-02-18 DIAGNOSIS — I444 Left anterior fascicular block: Secondary | ICD-10-CM

## 2022-02-18 DIAGNOSIS — Z7901 Long term (current) use of anticoagulants: Secondary | ICD-10-CM

## 2022-02-18 DIAGNOSIS — E785 Hyperlipidemia, unspecified: Secondary | ICD-10-CM

## 2022-02-18 DIAGNOSIS — Z950 Presence of cardiac pacemaker: Secondary | ICD-10-CM

## 2022-02-18 DIAGNOSIS — G4733 Obstructive sleep apnea (adult) (pediatric): Secondary | ICD-10-CM

## 2022-02-18 DIAGNOSIS — I4891 Unspecified atrial fibrillation: Secondary | ICD-10-CM

## 2022-02-18 DIAGNOSIS — R0789 Other chest pain: Secondary | ICD-10-CM

## 2022-02-18 DIAGNOSIS — I493 Ventricular premature depolarization: Secondary | ICD-10-CM

## 2022-02-18 DIAGNOSIS — G629 Polyneuropathy, unspecified: Secondary | ICD-10-CM

## 2022-02-18 NOTE — Progress Notes
Date of Service: 02/18/2022    Robert Mercado is a 76 y.o. male.       HPI     Robert Mercado is a 76 y.o.  white male with H/o CAD, S/P PTCA of the LAD in 2010, s/p PCI of the diagonal branch for in-stent restenosis at [facility] in February 2013, history of cardiomyopathy, status post PVC ablation (RVOT PVCs and LV apical septal PVCs) in February 2018, HFrEF with moderate LV dysfunction in December 2017, h/o SSS status post PPM implant in July 2011, s/p PPM  generator change in [facility] [place], mild aortic valve stenosis, paroxysmal atrial fibrillation patient continues to be anticoagulated with vitamin K antagonist, history of COVID-19 viral syndrome infection in August 2022, OSA using a CPAP machine.    Patient did undergo right shoulder replacement in January 2023, he is currently undergoing physical therapy, he is able to lift the right arm, and he is recovering well.    He did experience chest pain, was evaluated in the emergency room department at [facility] on 01/19/2022, patient was diagnosed with atypical chest pain, and he was discharged home in stable condition.    He has not experienced any recurrent episodes of chest pain, has not taken sublingual nitroglycerin.    He does have asymmetrical lower extremity edema, left lower extremity more than the right lower extremity, his symptoms have been well-contained with torsemide.    Patient was also evaluated with a 2D echo Doppler study on 10/31/2021, LVEF = 60%, mild MR, aortic valve was calcified with mild to moderate stenosis and trace regurgitation, the aorta was mildly dilated but probably commensurate to patient's body size, Normal ASI (aortic size index),  1.9 cm/m? (normal <2.1 cm/m?) , mild to moderate TR.           Vitals:    02/18/22 0949   BP: 110/68   BP Source: Arm, Left Upper   Pulse: 99   SpO2: 95%   O2 Device: None (Room air)   PainSc: Zero   Weight: 126.1 kg (278 lb)   Height: 182.9 cm (6')     Body mass index is 37.7 kg/m?Marland Kitchen     Past Medical History  Patient Active Problem List    Diagnosis Date Noted   ? COVID-19 virus infection 10/17/2021   ? OSA (obstructive sleep apnea) 02/22/2019   ? Hospital discharge follow-up 08/09/2018   ? Combined systolic and diastolic heart failure (HCC) 07/30/2018   ? 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 Chronic fatigue 04/01/2018   ? Tenosynovitis, de Quervain 10/21/2017   ? PVC (premature ventricular contraction) 12/31/2016   ? PVC's (premature ventricular contractions) 11/06/2016     0214/18 PVC ablation - x 3 morphologies RVOT      ? Preoperative cardiovascular examination 04/28/2016   ? Hospital discharge follow-up 03/10/2016   ? Chronic fatigue 08/21/2015   ? Hypotension due to drugs 08/21/2015   ? 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 Chronic anticoagulation, on warfarin 08/16/2014   ? OSA (obstructive sleep apnea) 08/17/2013   ? Obesity (BMI 30-39.9) 08/17/2013   ? 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 Atypical chest pain 05/02/2011   ? SSS (sick sinus syndrome) (HCC) 05/21/2010   ? HLD (hyperlipidemia) 11/12/2009   ? CAD (coronary artery disease) 11/12/2009     08/16/14: Cath by Dr. Mackey Birchwood: 30% instent restenosis of previously placed stent in the LAD, and patent 2nd DIAG stent  and 60% distal LAD disease. Medical management     ? Peripheral neuropathy 11/12/2009         Review of Systems   Constitutional: Negative.   HENT: Negative.    Eyes: Negative.    Cardiovascular: Negative.    Respiratory: Negative.    Endocrine: Negative.    Hematologic/Lymphatic: Negative.    Skin: Negative.    Musculoskeletal: Negative.    Gastrointestinal: Negative.    Genitourinary: Negative.    Neurological: Negative.    Psychiatric/Behavioral: Negative.    Allergic/Immunologic: Negative.        Physical Exam    General Appearance: normal in appearance  Skin: warm, moist, no ulcers or xanthomas  Eyes: conjunctivae and lids normal, pupils are equal and round  Lips & Oral Mucosa: no pallor or cyanosis  Neck Veins: neck veins are flat, neck veins are not distended  Chest Inspection: chest is normal in appearance  Respiratory Effort: breathing comfortably, no respiratory distress  Auscultation/Percussion: lungs clear to auscultation, no rales or rhonchi, no wheezing  Cardiac Rhythm: regular rhythm and normal rate  Cardiac Auscultation: S1, S2 normal, no rub, no gallop  Murmurs: 2/6 systolic  Murmur @ RUSB  Carotid Arteries: normal carotid upstroke bilaterally, no bruit  Lower Extremity Edema: left  lower extremity edema  Abdominal Exam: soft, non-tender, no masses, bowel sounds normal  Liver & Spleen: no organomegaly  Language and Memory: patient responsive and seems to comprehend information  Neurologic Exam: neurological assessment grossly intact    Cardiovascular Studies      Cardiovascular Health Factors  Vitals BP Readings from Last 3 Encounters:   02/18/22 110/68   10/31/21 100/62   10/17/21 100/62     Wt Readings from Last 3 Encounters:   02/18/22 126.1 kg (278 lb)   10/31/21 126 kg (277 lb 12.5 oz)   10/17/21 126.3 kg (278 lb 6.4 oz)     BMI Readings from Last 3 Encounters:   02/18/22 37.70 kg/m?   10/31/21 37.67 kg/m?   10/17/21 37.76 kg/m?      Smoking Social History     Tobacco Use   Smoking Status Never   Smokeless Tobacco Never      Lipid Profile Cholesterol   Date Value Ref Range Status   09/09/2021 149  Final     HDL   Date Value Ref Range Status   09/09/2021 49  Final     LDL   Date Value Ref Range Status   09/09/2021 73  Final     Triglycerides   Date Value Ref Range Status   09/09/2021 134  Final      Blood Sugar No results found for: HGBA1C  Glucose   Date Value Ref Range Status   10/24/2021 103  Final   04/22/2019 121 (H) 10 - 105 Final   09/27/2018 118 (H) 83 - 110 Final   05/13/2010 76 65 - 99 mg/dL Final     Comment:                   Fasting reference interval             Problems Addressed Today  Encounter Diagnoses   Name Primary?   ? SSS (sick sinus syndrome) (HCC) Yes   ? Hypotension due to drugs    ? Coronary artery disease involving native coronary artery of native heart without angina pectoris    ? Obesity (BMI 30-39.9)    ? PVC's (premature ventricular contractions)    ?  LAFB (left anterior fascicular block)    ? Mixed hyperlipidemia    ? OSA (obstructive sleep apnea)    ? Hospital discharge follow-up    ? Cardiac pacemaker in situ    ? Chronic anticoagulation, on warfarin    ? Atypical chest pain    ? Paroxysmal atrial fibrillation (HCC)    ? Edema of left lower extremity        Assessment and Plan     Assessment:  ?  1.  Coronary artery disease  ? Currently no symptoms of angina, patient did experience atypical chest pain, he took sublingual nitroglycerin and did not notice any significant change  2.  Status post coronary artery intervention  ? PTCA of the LAD in April 2010  ? PCI of the diagonal branch in February 2013  ? Status post left heart catheterization in December 2021 in [place], [place]- this demonstrated mid LAD lesion 55%, IFR did not indicate any hemodynamically significant stenosis, D1 lesion 70% stenosis, this is known from before and this is a small to medium size vessel, second diagonal 50% stenosis is a small vessel  3.  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  Status post PPM implant in July 2011  ? Patient did undergo generator change at the hospital in [place], [place]  5.  Status post PVC ablation in February 2018  6.  Known history of HFrEF-in the past the LVEF was documented to be in the range of mildly depressed/lower limits of normal  7.  Mild aortic valve stenosis-patient was evaluated with an echocardiogram at the outside hospital in December 2021, aortic valve MG~11 mmHg, the most recent echocardiogram performed on 10/31/2021 demonstrated mild to moderate aortic valve stenosis  8.  Obesity-patient's BMI is 37.76  9.  OSA-patient does use a CPAP  10.  Preoperatively cardiovascular valuation preceding right shoulder surgery  11.  Abnormal findings on the chest CT-this study is dated 10/19/2021- 7 mm nodule scarring at the right upper margin of the right major fissure  12.  Status post right upper shoulder replacement-recovering well  13.  Atypical chest pain, status post ED evaluation at [facility] on 01/19/2022-patient was discharged home in stable condition  14.  Left lower extremity edema-this has been under good control with torsemide p.o.    Plan:    1.  Continue all current medications  2.  I did advise on a strict low-sodium diet  3.  Continue physical therapy for the right shoulder and overall risk factors modification  4.  Follow-up office visit in 6 months  5.  Follow-up with your office regarding the abnormal findings on the chest CT (see above).    Total Time Today was 40 minutes in the following activities: Preparing to see the patient, Obtaining and/or reviewing separately obtained history, Performing a medically appropriate examination and/or evaluation, Counseling and educating the patient/family/caregiver, Ordering medications, tests, or procedures, Referring and communication with other health care professionals (when not separately reported), Documenting clinical information in the electronic or other health record, Independently interpreting results (not separately reported) and communicating results to the patient/family/caregiver and Care coordination (not separately reported)         Current Medications (including today's revisions)  ? aspirin EC 81 mg tablet Take one tablet by mouth daily.   ? atorvastatin (LIPITOR) 20 mg tablet Take one tablet by mouth daily.   ? betamethasone dipropionate 0.05 % topical cream Apply  topically to affected area twice daily.   ? coenzyme Q10 200 mg capsule Take one capsule by mouth daily.   ? dilTIAZem CD (CARDIZEM CD) 180 mg capsule Take one capsule by mouth twice daily. (Patient taking differently: Take one capsule by mouth daily.)   ? finasteride (PROSCAR) 5 mg tablet Take one tablet by mouth daily.   ? fluticasone (FLONASE) 50 mcg/actuation nasal spray Apply  to each nostril as directed daily. Shake bottle gently before using.   ? gabapentin (NEURONTIN) 300 mg capsule Take three capsules by mouth three times daily.   ? gabapentin (NEURONTIN) 600 mg tablet Take one tablet by mouth three times daily.   ? HYDROcodone-acetaminophen(+) (NORCO) 7.5-325 mg tablet Take one tablet by mouth every 6 hours as needed.   ? lidocaine (LIDODERM) 5 % topical patch Apply one patch topically to affected area every 24 hours. Apply patch for 12 hours, then remove for 12 hours before repeating.   ? MELATONIN PO Take  by mouth.   ? metoprolol XL (TOPROL XL) 50 mg extended release tablet Take one tablet by mouth twice daily.   ? nitroglycerin (NITROSTAT) 0.4 mg tablet Take one tablet sublingually as needed every 5 min for chest pain X three   ? ondansetron (ZOFRAN) 4 mg tablet Take one tablet by mouth as Needed.   ? oxyCODONE (ROXICODONE) 5 mg tablet    ? pantoprazole DR (PROTONIX) 20 mg tablet Take one tablet by mouth daily.   ? potassium chloride (KLOR-CON SPRINKLE) 10 mEq capsule Take two capsules by mouth daily. Take with a meal and a full glass of water.   ? torsemide (DEMADEX) 20 mg tablet Take one tablet by mouth daily.   ? traMADol (ULTRAM) 50 mg tablet Take one tablet by mouth every 6 hours as needed for Pain.   ? vit A/vit C/vit E/zinc/copper (PRESERVISION AREDS PO) Take  by mouth twice daily.   ? vitamins, B complex PO Tab Take one tablet by mouth daily.   ? vitamins, multiple PO Cap Take one capsule by mouth daily.   ? warfarin (COUMADIN) 4 mg tablet Take 1-2 tabs as directed by [facility]

## 2022-04-06 ENCOUNTER — Encounter: Admit: 2022-04-06 | Discharge: 2022-04-06 | Payer: MEDICARE

## 2022-04-07 ENCOUNTER — Encounter: Admit: 2022-04-07 | Discharge: 2022-04-07 | Payer: MEDICARE

## 2022-04-07 DIAGNOSIS — E782 Mixed hyperlipidemia: Secondary | ICD-10-CM

## 2022-04-07 DIAGNOSIS — I5042 Chronic combined systolic (congestive) and diastolic (congestive) heart failure: Secondary | ICD-10-CM

## 2022-04-07 DIAGNOSIS — I2510 Atherosclerotic heart disease of native coronary artery without angina pectoris: Secondary | ICD-10-CM

## 2022-04-07 MED ORDER — POTASSIUM CHLORIDE 10 MEQ PO CPER
20 meq | ORAL_CAPSULE | Freq: Every day | ORAL | 3 refills | 30.00000 days | Status: AC
Start: 2022-04-07 — End: ?

## 2022-04-07 NOTE — Telephone Encounter
-----   Message from Marlane Mingle sent at 04/07/2022  1:37 PM CDT -----  Regarding: Chest pain  Contact: [phone]  Any time on Tuesday, May 30 in [place] will be great.  Thank you, Rowe Robert

## 2022-04-07 NOTE — Telephone Encounter
Scheduled pt to see SHT in the [place] office.  Instructed pt to go to the ED for worsening symptoms or if he needed to use SL NTG.

## 2022-04-15 ENCOUNTER — Encounter: Admit: 2022-04-15 | Discharge: 2022-04-15 | Payer: MEDICARE

## 2022-04-15 DIAGNOSIS — I2510 Atherosclerotic heart disease of native coronary artery without angina pectoris: Secondary | ICD-10-CM

## 2022-04-15 DIAGNOSIS — E669 Obesity, unspecified: Secondary | ICD-10-CM

## 2022-04-15 DIAGNOSIS — I495 Sick sinus syndrome: Secondary | ICD-10-CM

## 2022-04-15 DIAGNOSIS — I444 Left anterior fascicular block: Secondary | ICD-10-CM

## 2022-04-15 DIAGNOSIS — I480 Paroxysmal atrial fibrillation: Secondary | ICD-10-CM

## 2022-04-15 DIAGNOSIS — Z7901 Long term (current) use of anticoagulants: Secondary | ICD-10-CM

## 2022-04-15 DIAGNOSIS — R0602 Shortness of breath: Secondary | ICD-10-CM

## 2022-04-15 DIAGNOSIS — I5042 Chronic combined systolic (congestive) and diastolic (congestive) heart failure: Secondary | ICD-10-CM

## 2022-04-15 DIAGNOSIS — Z950 Presence of cardiac pacemaker: Secondary | ICD-10-CM

## 2022-04-15 DIAGNOSIS — E785 Hyperlipidemia, unspecified: Secondary | ICD-10-CM

## 2022-04-15 DIAGNOSIS — R079 Chest pain, unspecified: Secondary | ICD-10-CM

## 2022-04-15 DIAGNOSIS — I4891 Unspecified atrial fibrillation: Secondary | ICD-10-CM

## 2022-04-15 DIAGNOSIS — I493 Ventricular premature depolarization: Secondary | ICD-10-CM

## 2022-04-15 DIAGNOSIS — G629 Polyneuropathy, unspecified: Secondary | ICD-10-CM

## 2022-04-15 DIAGNOSIS — G4730 Sleep apnea, unspecified: Secondary | ICD-10-CM

## 2022-04-15 NOTE — Progress Notes
Date of Service: 04/15/2022    Robert Mercado is a 76 y.o. male.       HPI     Patient is a 75 year old Caucasian male past medical history of coronary artery disease with remote history of percutaneous coronary intervention, preserved left ventricular ejection fraction, metabolic syndrome, sick sinus syndrome with history of chronic PVCs as well as paroxysmal to persistent atrial fibrillation.  He has a dual-chamber pacemaker in place.  Recent checks show that he is predominantly atrial paced and ventricular sensed.  He was seen in April by Dr. Radford Pax and had an episode of chest pain that led to ER evaluation.  At that time did not detect any unstable cardiovascular abnormality and ultimately was discharged home.  Reports he continues to struggle intermittently where he will get chest pain fairly centrally in his chest especially when he is ambulating to and from his mailbox.  Last time it happened was a few days ago.  Usually resolves after about 10 minutes and then has taken nitroglycerin at times.  No new orthopnea, change in edema, or lightheadedness.  Denies any diaphoresis or radiation of the pain.         Vitals:    04/15/22 1401   BP: 110/82   BP Source: Arm, Left Upper   Pulse: 84   SpO2: 99%   O2 Percent: 99 %   O2 Device: None (Room air)   PainSc: Four   Weight: 129.2 kg (284 lb 12.8 oz)   Height: 182.9 cm (6')     Body mass index is 38.63 kg/m?Marland Kitchen     Past Medical History  Patient Active Problem List    Diagnosis Date Noted   ? Edema of left lower extremity 02/18/2022   ? COVID-19 virus infection 10/17/2021   ? OSA (obstructive sleep apnea) 02/22/2019   ? Hospital discharge follow-up 08/09/2018   ? Combined systolic and diastolic heart failure (HCC) 07/30/2018   ? 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 Chronic fatigue 04/01/2018   ? Tenosynovitis, de Quervain 10/21/2017   ? PVC (premature ventricular contraction) 12/31/2016   ? PVC's (premature ventricular contractions) 11/06/2016     0214/18 PVC ablation - x 3 morphologies RVOT      ? Preoperative cardiovascular examination 04/28/2016   ? Hospital discharge follow-up 03/10/2016   ? Chronic fatigue 08/21/2015   ? Hypotension due to drugs 08/21/2015   ? 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 Chronic anticoagulation, on warfarin 08/16/2014   ? OSA (obstructive sleep apnea) 08/17/2013   ? Obesity (BMI 30-39.9) 08/17/2013   ? Paroxysmal atrial fibrillation (HCC) 04/19/2012   ? 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 Atypical chest pain 05/02/2011   ? SSS (sick sinus syndrome) (HCC) 05/21/2010   ? HLD (hyperlipidemia) 11/12/2009   ? CAD (coronary artery disease) 11/12/2009     08/16/14: Cath by Dr. Mackey Birchwood: 30% instent restenosis of previously placed stent in the LAD, and patent 2nd DIAG stent and 60% distal LAD disease. Medical management     ? Peripheral neuropathy 11/12/2009         Review of Systems   Constitutional: Positive for malaise/fatigue.   HENT: Negative.    Eyes: Negative.    Cardiovascular: Positive for chest pain, dyspnea on exertion and leg swelling.   Respiratory: Positive for shortness of breath.    Endocrine: Negative.    Hematologic/Lymphatic: Negative.    Skin: Negative.    Musculoskeletal: Positive for back pain, joint  pain, muscle weakness and myalgias.   Gastrointestinal: Negative.    Genitourinary: Negative.    Neurological: Positive for weakness.   Psychiatric/Behavioral: Negative.    Allergic/Immunologic: Negative.        Physical Exam  Large male in no acute distress.  Very pleasant and appears given age  Pupils are equal react without scleral injection  Neck is supple, large neck circumference with no obvious masses or asymmetry.  No carotid bruits and normal carotid upstroke.  Jugular venous distention about 8 to 10 cm  Chest is symmetric and lungs are clear to auscultation with good effort  Heart S1, S2 that are normal.  No significant murmur, clicks, or gallops  Abdomen is protuberant but soft  Pulse are 2+, regular, and symmetric bilaterally radial as well as pedal location  2-3+ edema in the lower legs with minimal pitting, decreased hair growth bilaterally below the knees  Symmetric skin turgor with no skin abrasions or abnormalities    Cardiovascular Studies      Cardiovascular Health Factors  Vitals BP Readings from Last 3 Encounters:   04/15/22 110/82   02/18/22 110/68   10/31/21 100/62     Wt Readings from Last 3 Encounters:   04/15/22 129.2 kg (284 lb 12.8 oz)   02/18/22 126.1 kg (278 lb)   10/31/21 126 kg (277 lb 12.5 oz)     BMI Readings from Last 3 Encounters:   04/15/22 38.63 kg/m?   02/18/22 37.70 kg/m?   10/31/21 37.67 kg/m?      Smoking Social History     Tobacco Use   Smoking Status Never   Smokeless Tobacco Never      Lipid Profile Cholesterol   Date Value Ref Range Status   09/09/2021 149  Final     HDL   Date Value Ref Range Status   09/09/2021 49  Final     LDL   Date Value Ref Range Status   09/09/2021 73  Final     Triglycerides   Date Value Ref Range Status   09/09/2021 134  Final      Blood Sugar No results found for: HGBA1C  Glucose   Date Value Ref Range Status   10/24/2021 103  Final   04/22/2019 121 (H) 10 - 105 Final   09/27/2018 118 (H) 83 - 110 Final   05/13/2010 76 65 - 99 mg/dL Final     Comment:                   Fasting reference interval             Problems Addressed Today  Encounter Diagnoses   Name Primary?   ? Chest pain, unspecified type Yes   ? Coronary artery disease involving native coronary artery of native heart without angina pectoris    ? PVC's (premature ventricular contractions)    ? Cardiac pacemaker in situ    ? LAFB (left anterior fascicular block)    ? SSS (sick sinus syndrome) (HCC)    ? Chronic combined systolic and diastolic heart failure (HCC)    ? Shortness of breath        Assessment and Plan     Recurrent exertional chest pain and mild progression of shortness of breath on exertion, history of coronary artery disease and multiple cardiovascular disease risk factors. Medications are reasonable.  At this time we will arrange for nuclear cardiac stress test.  Will contact patient about the results and determine further intervention  at the time.  Patient does have polypharmacy issues and some findings for polypharmacy.  We will look to optimize his medication list as we go forward.  Rhythm findings have been clinically stable and made no changes to his therapy for his history of atrial fibrillation PVCs at this time         Current Medications (including today's revisions)  ? aspirin EC 81 mg tablet Take one tablet by mouth daily.   ? atorvastatin (LIPITOR) 20 mg tablet Take one tablet by mouth daily.   ? betamethasone dipropionate 0.05 % topical cream Apply  topically to affected area twice daily.   ? coenzyme Q10 200 mg capsule Take one capsule by mouth daily.   ? dilTIAZem CD (CARDIZEM CD) 180 mg capsule Take one capsule by mouth twice daily. (Patient taking differently: Take one capsule by mouth daily.)   ? finasteride (PROSCAR) 5 mg tablet Take one tablet by mouth daily.   ? fluticasone (FLONASE) 50 mcg/actuation nasal spray Apply two sprays to each nostril as directed as Needed. Shake bottle gently before using.   ? gabapentin (NEURONTIN) 300 mg capsule Take three capsules by mouth three times daily.   ? lidocaine (LIDODERM) 5 % topical patch Apply one patch topically to affected area every 24 hours. Apply patch for 12 hours, then remove for 12 hours before repeating. (Patient taking differently: Apply one patch topically to affected area as Needed. Apply patch for 12 hours, then remove for 12 hours before repeating.)   ? MELATONIN PO Take 10 mg by mouth at bedtime daily.   ? metoprolol XL (TOPROL XL) 50 mg extended release tablet Take one tablet by mouth twice daily.   ? nitroglycerin (NITROSTAT) 0.4 mg tablet Take one tablet sublingually as needed every 5 min for chest pain X three   ? pantoprazole DR (PROTONIX) 20 mg tablet Take one tablet by mouth daily.   ? potassium chloride (KLOR-CON SPRINKLE) 10 mEq capsule Take two capsules by mouth daily. Take with a meal and a full glass of water.   ? torsemide (DEMADEX) 20 mg tablet Take one tablet by mouth daily.   ? vit A/vit C/vit E/zinc/copper (PRESERVISION AREDS PO) Take  by mouth twice daily.   ? vitamins, multiple PO Cap Take one capsule by mouth daily.   ? warfarin (COUMADIN) 4 mg tablet Take 1-2 tabs as directed by [facility]

## 2022-04-17 ENCOUNTER — Ambulatory Visit: Admit: 2022-04-17 | Discharge: 2022-04-17 | Payer: MEDICARE

## 2022-04-17 ENCOUNTER — Encounter: Admit: 2022-04-17 | Discharge: 2022-04-17 | Payer: MEDICARE

## 2022-04-17 DIAGNOSIS — I5042 Chronic combined systolic (congestive) and diastolic (congestive) heart failure: Secondary | ICD-10-CM

## 2022-04-17 DIAGNOSIS — I2510 Atherosclerotic heart disease of native coronary artery without angina pectoris: Secondary | ICD-10-CM

## 2022-04-17 DIAGNOSIS — R0602 Shortness of breath: Secondary | ICD-10-CM

## 2022-04-17 DIAGNOSIS — R079 Chest pain, unspecified: Secondary | ICD-10-CM

## 2022-04-17 DIAGNOSIS — Z950 Presence of cardiac pacemaker: Secondary | ICD-10-CM

## 2022-04-17 DIAGNOSIS — I495 Sick sinus syndrome: Secondary | ICD-10-CM

## 2022-04-17 DIAGNOSIS — I444 Left anterior fascicular block: Secondary | ICD-10-CM

## 2022-04-17 DIAGNOSIS — I493 Ventricular premature depolarization: Secondary | ICD-10-CM

## 2022-04-18 ENCOUNTER — Encounter: Admit: 2022-04-18 | Discharge: 2022-04-18 | Payer: MEDICARE

## 2022-04-18 NOTE — Telephone Encounter
-----   Message from Agapito Games, RN sent at 04/18/2022  1:38 PM CDT -----    ----- Message -----  From: Levora Angel, MD  Sent: 04/18/2022   1:24 PM CDT  To: [facility] Nurse [facility] Card Team [place]    Let him know his stress test is normal.  It is unlikely that chest pain is being caused by his heart at this time.  Would have him see his primary physician if recurrent pain is occurring.  At this time would have him follow up with Dr. Avie Arenas as scheduled.  ----- Message -----  From: Levora Angel, MD  Sent: 04/18/2022  11:15 AM CDT  To: Levora Angel, MD

## 2022-04-24 ENCOUNTER — Encounter: Admit: 2022-04-24 | Discharge: 2022-04-24 | Payer: MEDICARE

## 2022-05-29 ENCOUNTER — Encounter: Admit: 2022-05-29 | Discharge: 2022-05-29 | Payer: MEDICARE

## 2022-05-29 NOTE — Telephone Encounter
[facility]  MRI Recommendations for [place] Cardiac Device Patient Undergoing MRI at Outside Facility  CIED and MRI Clinician Check List Page 1 of 2    Thank you so much for reaching out to [place] regarding our mutual patient: Robert Mercado DOB: [birth year].  The [place] Device Team has filled out Section I and provided recommendations based on current available data in the patient's medical record. Section II and III should be completed by Radiology Staff and the Device Representative, respectfully.  If upon arrival, the device representative notes changes/ concerns, please use best clinical judgment or follow up with the [place] Device Team. We encourage this document to be fully completed and included in the patient's medical record following the MRI scan.    Orders given [place] MRI at [facility] in [place].     SECTION I Completed by  Device Team Member:     MRI System Conditions     Patient has a complete MRI ready system with MR Conditional generator and lead(s):                         [x]   Yes    []  No      The system is absent of lead extenders, lead adapters, broken leads, and abandoned leads: [x]   Yes    []  No      Lead impedance measurements are within the programmed lead impedance limits (For atrial and ventricular pacing leads: lead impedance 200-2,000 ohms.  For defibrillation leads:  lead impedance 20-200 ohms):                      [x]   Yes      []  No        Generator is implanted in the pectoral region:  [x]   Yes      []  No       Generator has sufficient battery (generator is not at end-of-life/ ERI/ EOS/ RRT):                                                                           [x]   Yes      []  No        Patient is historically NOT dependent (as defined by no escape greater than 40 bpm): []   Correct    [x]  Incorrect   Chest X-ray has been reviewed since implantation (or revision of the leads) of the device/ lead(s):  [x]   Yes    []  Radiology to Review      Recommendations for Pre-MRI Scan Device Settings   (Full assessment of programming to be confirmed by device representative at time of MRI)   Capture threshold < 3.5 V at a pulse width of 0.5 ms for RA and RV leads for devices that will be programmed to an asynchronous pacing mode: [x]   Yes      []  No      Pacing rate selected to avoid completive pacing (recommendations based on historical data):    [x]  Pacing Mode: [x]   AOO   Programming Recommendations Made in Collaboration with: Craig Guess    Form Completed by: Anner Crete, RN  Date: 05/29/22     SECTION II Completed by Radiology Staff Member:    Check List for Radiology Staff    If Chest X-ray has not been reviewed (as indicated in section I), chest X-ray has been reviewed by Radiology staff since implantation of the device/ lead(s): []   Yes   []  No      Patient has no abandoned leads or leads outside of the ones connected to the device: []   Yes    []  No       MRI scan will be performed with a 1.5 Tesla scanner unless the device is FDA approved for a 3 Tesla scanner: []   Yes    []  No       Radiology staff have confirmed the patient has a complete MRI ready system with MR Conditional generator and lead(s) with the device representative:  []   Yes   []  No      Radiology staff have confirmed the number of leads implanted with the device representative:  []   Yes   []  No      Radiology staff have confirmed MRI settings, status, and that the device has been placed in an appropriate scanning mode prior to the MRI:  []   Yes    []  No        Radiology staff has confirmed that device settings have been restored to original settings post MRI:  []   Yes  []  No        SECTION III Completed by Device Representative:    Check List for Device Settings Pre & Post MRI    Underlying rhythm assessed prior to the MRI:  []   Yes   []  No         MRI settings, status, and programming of the device in an appropriate scanning mode prior to the MRI has been confirmed with the Radiology staff.   []   Yes   []  No        Confirmed that patient has a complete MRI ready system with MR Conditional generator and lead(s): []   Yes    []  nNo        Device programing restored to original settings following the MRI:          []   Yes []  No        Report of final programming parameters printed and sent to [place] device team (Email [email or web address] Fax: [phone]): []   Yes   []  No             [place]    [email or web address]  [employer]    [place]     [phone]  [email or web address]

## 2022-06-03 ENCOUNTER — Encounter: Admit: 2022-06-03 | Discharge: 2022-06-03 | Payer: MEDICARE

## 2022-06-18 ENCOUNTER — Encounter: Admit: 2022-06-18 | Discharge: 2022-06-18 | Payer: MEDICARE

## 2022-06-19 ENCOUNTER — Encounter: Admit: 2022-06-19 | Discharge: 2022-06-19 | Payer: MEDICARE

## 2022-06-25 ENCOUNTER — Encounter: Admit: 2022-06-25 | Discharge: 2022-06-25 | Payer: MEDICARE

## 2022-07-03 ENCOUNTER — Encounter: Admit: 2022-07-03 | Discharge: 2022-07-03 | Payer: MEDICARE

## 2022-07-04 ENCOUNTER — Encounter: Admit: 2022-07-04 | Discharge: 2022-07-04 | Payer: MEDICARE

## 2022-07-04 NOTE — Telephone Encounter
Received below note from [address]. Pt is scheduled for release middle and ring fingers left hand on 07/10/22.      Discussed with Dr. Doretha Imus. Dr. Avie Arenas states that this is a low risk surgery, and pt is low cardiac risk. She states pt may hold warfarin as noted below, bridging is not needed. Pt should resume warfarin as soon as OK with surgeon after surgery.     Faxing this note to Andi Devon at fax noted below.

## 2022-07-22 ENCOUNTER — Encounter: Admit: 2022-07-22 | Discharge: 2022-07-22 | Payer: MEDICARE

## 2022-07-24 ENCOUNTER — Encounter: Admit: 2022-07-24 | Discharge: 2022-07-24 | Payer: MEDICARE

## 2022-07-24 MED ORDER — AMIODARONE 200 MG PO TAB
400 mg | ORAL_TABLET | Freq: Two times a day (BID) | ORAL | 1 refills | 42.00000 days | Status: AC
Start: 2022-07-24 — End: ?

## 2022-07-24 NOTE — Telephone Encounter
-----   Message from Algis Liming sent at 07/22/2022  7:23 AM CDT -----  Regarding: AT/AF Alert  Alert received for AT/AF > 6 hours for 1 day. Stored EGMs are consistent with or suggestive of Atrial Fibrillation.  AT/AF Burden: 1.4%. Total number of events: 1. Patient has been in AF since 07/18/22 and ongoing at the time of interrogation. V rates > 100 bpm ~ 40% of the time. Patient has a history of AF with OAC present on med list. Please see full report and follow up with patient as needed.    Thanks,  Morrie Sheldon

## 2022-07-24 NOTE — Telephone Encounter
Discussed with Dr Harvel Ricks.  Orders received to start dc cardizem start amiodarone 200mg  bid.  EKG CMP TSH next week. With results to her.  Possible cardioversion.

## 2022-07-25 ENCOUNTER — Encounter: Admit: 2022-07-25 | Discharge: 2022-07-25 | Payer: MEDICARE

## 2022-07-25 DIAGNOSIS — Z79899 Other long term (current) drug therapy: Secondary | ICD-10-CM

## 2022-07-25 DIAGNOSIS — I480 Paroxysmal atrial fibrillation: Secondary | ICD-10-CM

## 2022-07-25 MED ORDER — AMIODARONE 200 MG PO TAB
200 mg | ORAL_TABLET | Freq: Two times a day (BID) | ORAL | 3 refills | 42.00000 days | Status: AC
Start: 2022-07-25 — End: ?

## 2022-07-25 NOTE — Progress Notes
Labs and/or CXR for amiodarone surveillance due per Amiodarone protocol.  Lab requisitions/orders and letter explaining need for testing mailed to patient or given to patient in clinic.

## 2022-07-28 ENCOUNTER — Encounter: Admit: 2022-07-28 | Discharge: 2022-07-28 | Payer: MEDICARE

## 2022-07-29 ENCOUNTER — Encounter: Admit: 2022-07-29 | Discharge: 2022-07-29 | Payer: MEDICARE

## 2022-07-29 DIAGNOSIS — I4891 Unspecified atrial fibrillation: Secondary | ICD-10-CM

## 2022-07-29 LAB — CBC AND DIFF
ABSOLUTE BASO COUNT: 0 K/UL (ref 0–0.20)
ABSOLUTE EOS COUNT: 0 K/UL (ref 0–0.45)
ABSOLUTE LYMPH COUNT: 0.8 K/UL — ABNORMAL LOW (ref 1.0–4.8)
ABSOLUTE MONO COUNT: 0.5 K/UL (ref 0–0.80)
ABSOLUTE NEUTROPHIL: 4.9 K/UL (ref 1.8–7.0)
BASOPHILS %: 1 % (ref 0–2)
EOSINOPHILS %: 1 % (ref 0–5)
LYMPHOCYTES %: 13 % — ABNORMAL LOW (ref 24–44)
MONOCYTES %: 9 % (ref 4–12)
WBC COUNT: 6.5 K/UL (ref 4.5–11.0)

## 2022-07-29 LAB — BASIC METABOLIC PANEL
ANION GAP: 9 pg (ref 3–12)
BLD UREA NITROGEN: 30 mg/dL — ABNORMAL HIGH (ref 7–25)
CALCIUM: 8.1 mg/dL — ABNORMAL LOW (ref 8.5–10.6)
CHLORIDE: 99 MMOL/L — ABNORMAL LOW (ref 98–110)
CO2: 27 MMOL/L (ref 21–30)
CREATININE: 1.4 mg/dL — ABNORMAL HIGH (ref 0.4–1.24)
GLUCOSE,PANEL: 101 mg/dL — ABNORMAL HIGH (ref 70–100)
SODIUM: 135 MMOL/L — ABNORMAL LOW (ref 137–147)

## 2022-07-29 LAB — PROTIME INR (PT)
INR: 2.4 — ABNORMAL HIGH (ref 0.8–1.2)
PROTIME: 27 s — ABNORMAL HIGH (ref 9.5–14.2)

## 2022-07-29 LAB — LIPID PROFILE
CHOLESTEROL: 117 mg/dL (ref <200)
HDL: 43 mg/dL (ref >40)
LDL: 61 mg/dL (ref <100)
NON HDL CHOLESTEROL: 74 mg/dL
TRIGLYCERIDES: 74 mg/dL (ref <150)
VLDL: 15 mg/dL

## 2022-07-29 LAB — THYROID STIMULATING HORMONE-TSH: TSH: 0.7 uU/mL — ABNORMAL LOW (ref 0.35–5.00)

## 2022-07-29 MED ORDER — WARFARIN 4 MG PO TAB
4 mg | Freq: Every evening | ORAL | 0 refills | Status: DC
Start: 2022-07-29 — End: 2022-07-30

## 2022-07-29 MED ORDER — WARFARIN 4 MG PO TAB
4 mg | ORAL | 0 refills | Status: AC
Start: 2022-07-29 — End: ?
  Administered 2022-07-30: 04:00:00 4 mg via ORAL

## 2022-07-29 MED ORDER — PANTOPRAZOLE 20 MG PO TBEC
20 mg | Freq: Every day | ORAL | 0 refills | Status: AC
Start: 2022-07-29 — End: ?
  Administered 2022-07-30 – 2022-08-03 (×5): 20 mg via ORAL

## 2022-07-29 MED ORDER — ASPIRIN 81 MG PO TBEC
81 mg | Freq: Every day | ORAL | 0 refills | Status: AC
Start: 2022-07-29 — End: ?
  Administered 2022-07-30 – 2022-08-03 (×5): 81 mg via ORAL

## 2022-07-29 MED ORDER — METOPROLOL SUCCINATE 50 MG PO TB24
50 mg | Freq: Two times a day (BID) | ORAL | 0 refills | Status: AC
Start: 2022-07-29 — End: ?
  Administered 2022-07-30 – 2022-08-03 (×10): 50 mg via ORAL

## 2022-07-29 MED ORDER — BUMETANIDE 0.25 MG/ML IJ SOLN
2 mg | Freq: Once | INTRAVENOUS | 0 refills | Status: CP
Start: 2022-07-29 — End: ?
  Administered 2022-07-30: 04:00:00 2 mg via INTRAVENOUS

## 2022-07-29 MED ORDER — WARFARIN 2 MG PO TAB
2 mg | ORAL | 0 refills | Status: AC
Start: 2022-07-29 — End: ?
  Administered 2022-07-31: 01:00:00 2 mg via ORAL

## 2022-07-29 MED ORDER — AMIODARONE 200 MG PO TAB
200 mg | Freq: Two times a day (BID) | ORAL | 0 refills | Status: AC
Start: 2022-07-29 — End: ?
  Administered 2022-07-30 – 2022-08-03 (×10): 200 mg via ORAL

## 2022-07-29 MED ORDER — GABAPENTIN 300 MG PO CAP
900 mg | Freq: Three times a day (TID) | ORAL | 0 refills | Status: AC
Start: 2022-07-29 — End: ?
  Administered 2022-07-30 – 2022-08-03 (×15): 900 mg via ORAL

## 2022-07-29 MED ORDER — WARFARIN PHARMACY TO MANAGE
1 | 0 refills | Status: AC
Start: 2022-07-29 — End: ?

## 2022-07-29 MED ORDER — NITROGLYCERIN 0.4 MG SL SUBL
.4 mg | SUBLINGUAL | 0 refills | Status: AC | PRN
Start: 2022-07-29 — End: ?

## 2022-07-29 MED ORDER — ATORVASTATIN 20 MG PO TAB
20 mg | Freq: Every day | ORAL | 0 refills | Status: AC
Start: 2022-07-29 — End: ?
  Administered 2022-07-30 – 2022-08-03 (×5): 20 mg via ORAL

## 2022-07-29 MED ORDER — WARFARIN PHARMACY TO MANAGE
1 | 0 refills | Status: DC
Start: 2022-07-29 — End: 2022-07-30

## 2022-07-29 MED ORDER — FINASTERIDE 5 MG PO TAB
5 mg | Freq: Every day | ORAL | 0 refills | Status: AC
Start: 2022-07-29 — End: ?
  Administered 2022-07-30 – 2022-08-03 (×5): 5 mg via ORAL

## 2022-07-29 MED ORDER — TORSEMIDE 20 MG PO TAB
20 mg | Freq: Every day | ORAL | 0 refills | Status: AC
Start: 2022-07-29 — End: ?
  Administered 2022-07-30: 13:00:00 20 mg via ORAL

## 2022-07-29 MED ORDER — LIDOCAINE 5 % TP PTMD
1 | TOPICAL | 0 refills | Status: AC
Start: 2022-07-29 — End: ?
  Administered 2022-07-31 – 2022-08-02 (×3): 1 via TOPICAL

## 2022-07-29 NOTE — Telephone Encounter
One of the ED clinicians here at [facility] called late this afternoon about the patient's presentation today with increased dyspnea, decreased exercise tolerance, and fatigue.  They've done a work-up for HF exacerbation, ACS, etc., and the only finding is his persistent atrial fibrillation.    This was noted by remote interrogation on 9/8 and this current episode of AF started on 9/1.  He was switched from diltiazem to amiodarone by Dr. Harvel Ricks when called about him last week, but his symptoms have persisted.    I told the ED that I think it's reasonable to pursue transfer to [place] for cardioversion (TEE if necessary) to restore sinus/paced rhythm.    Saul Fordyce Barry Dienes, MD

## 2022-07-29 NOTE — Progress Notes
PMH: Afib; HTN; Pacer; CAD; GERD; OSA; CKD  Called from cards--> pacer was in Afib 9/1    Progressive weakness and short of breath since 9/1  Unable to care for self at home-debilitated    Labs:  Hgb-12.2  BNP-401  Creat-1.45 (baseline 1.3)  Trop--> 0.031 (Repeat in process)   COVID/Flu/RSV (-)  DDimer-538    CTA Chest (-) PE; no vascular congestion noted     Spoke to Dr. Barry Dienes with Cards who comes to [place]-may be weak due to Afib (has been in/out of Afib since 9/1)-likely not much reserve--> recs transfer for cardioversion as they are not available to do at [facility]      T-99.3  RR-12  HR-64 --> EKG--> SR  BP-113/56  SPO2-97% on RA  MS: A/O x4     Meds: Not administered

## 2022-07-30 ENCOUNTER — Inpatient Hospital Stay: Admit: 2022-07-30 | Discharge: 2022-07-30 | Payer: MEDICARE

## 2022-07-30 ENCOUNTER — Encounter: Admit: 2022-07-30 | Discharge: 2022-07-30 | Payer: MEDICARE

## 2022-07-30 ENCOUNTER — Inpatient Hospital Stay: Admit: 2022-07-30 | Payer: MEDICARE

## 2022-07-30 MED ADMIN — POTASSIUM CHLORIDE 20 MEQ PO TBTQ [35943]: 40 meq | ORAL | NDC 00832532511

## 2022-07-30 MED ADMIN — BUMETANIDE 0.25 MG/ML IJ SOLN [9308]: 2 mg | INTRAVENOUS | Stop: 2022-07-30 | NDC 72205010101

## 2022-07-30 MED ADMIN — PERFLUTREN LIPID MICROSPHERES 1.1 MG/ML IV SUSP [79178]: 2 mL | INTRAVENOUS | @ 18:00:00 | Stop: 2022-07-30 | NDC 11994001116

## 2022-07-30 MED ADMIN — DIPHENHYDRAMINE HCL 25 MG PO CAP [2509]: 25 mg | ORAL | @ 22:00:00 | Stop: 2022-07-30 | NDC 00904723761

## 2022-07-30 NOTE — H&P (View-Only)
Admission History and Physical Examination      Name: Robert Mercado   MRN: [id number]     DOB: [birth year]      Age: 76 y.o.  Admission Date: 07/29/2022     LOS: 0 days     Date of Service: 07/29/2022                        Assessment/Plan:    Principal Problem:    Atrial fibrillation (HCC)      THERIN PELC is a 76 y.o. 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   - Had an episode of Atrial Fibrillation from 09/01 through 09/04 for which he was started on Amiodarone (diltiazem was stopped)  - 06/01 Pharmacologic stress test: negative for ischemia. Summed stress score = 0.   - 10/31/2021 ECHO: LVEF: Normal LV with concentric hypertrophy, normal LV SF, EF 60% with diastolic dysfunction, mild MR, moderate AS with trace AR  - PTA Torsemide 20 mg daily, patient reports compliance with medication  - PTA Metoprolol  - 09/12 BNP - 2,676      Plan  > CBC, CMP,   > TSH   > IV Bumex 2mg  st  > Strict Is/Os  > Cardiology Heart Failure consult  > NPO at midnight  > ECHO      #Paroxysmal Atrial Fibrillation  #Sick Sinus Syndrome s/p PPM placement (2011)  #History of RVOT PVCs and LV apical septal PVCs s/p PVC ablation  - Had a call from cardiologist on 07/21/22 reporting a 4 day run of Atrial Fibrillation.   - Diltiazem stopped during that episode and Amiodarone started which resolved his symptoms  - Device interrogated 09/11: Trends/measurements are within standard limits/stable and consistent with prior trends  - 12 lead EKG: Atrial paced rhythm, RBBB, T- wave abnormality. Unchanged compared to EKG done in 03/2022  - PTA Aspirin + Warfarin    Plan  > PTA Aspirin 81 mg  > PTA Warfarin 4 mg daily (pharmacy to manage)  > PT/INR  > Device interrogation tomorrow morning      #HLD  #Obesity  - Baseline weight ~ 280 lbs  - Lipid profile 09/12: LDL - 61, TG -74, Chol - 117    Plan  > Lipid profile  > PTA Atorvastatin 20 mg daily      #Elevated Cr, possible AKI  - Baseline Cr appears to be around 1.2  Recent Labs     07/29/22  2203   BUN 30*   CR 1.44*     Plan  - Monitor kidney function with diuresis      #Sleep Apnea  - CPAP at night      FEN/DIET: Cardiac diet with 2L fluid restriction, NPO at midnight  Prophylaxis: Warfarin  Code status: Full  Dispo: Admit to Internal Medicine  ?  _____________________________________________________________________________    Primary Care Physician: Dwaine Gale  Not verified    Chief Complaint:  Decreasing exercise tolerance  History of Present Illness: Robert Mercado is a 76 y.o. 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 Prior to this episode, he was in his normal state of health and able to do some household activities. He received a call from his cardiologist about an episode of Afib and was started on Amiodarone. Since then, he noticed progressive decrease in his exercise tolerance with some weight gain and DOE. He reported to an OSH where he was seen, had a CXR and CTPA which where negative and referred to [facility] for cardiology. He notes that he has a 3/10 occipital headache, and some lightheadedness which has resolved. He has had prior cardiac workup for Chest pain which was non-revealing.     He denies PND, orthopnea, chest pain, nausea/vomitting,     Medical History:   Diagnosis Date   ? Atrial fibrillation (HCC)    ? CAD (coronary artery disease)    ? 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 Chronic anticoagulation, on warfarin 08/16/2014   ? HLD (hyperlipidemia) 11/12/2009   ? Hyperlipemia    ? Obesity (BMI 30-39.9) 08/17/2013   ? Paroxysmal atrial fibrillation (HCC) 04/19/2012   ? Peripheral neuropathy 11/12/2009   ? Sleep apnea 08/17/2013   ? Squamous cell carcinoma nos    ? SSS (sick sinus syndrome) (HCC) 05/21/2010     Surgical History:   Procedure Laterality Date   ? INTRACARDIAC CATHETER ABLATION WITH COMPREHENSIVE ELECTROPHYSIOLOGIC EVALUATION AND 3-DIMENSIONAL MAPPING - PREMATURE VENTRICULAR CONTRACTION N/A 12/31/2016    Performed by Deniece Ree, MD at [facility] EP LAB   ? APPENDECTOMY     ? CARDIOVASCULAR STRESS TEST     ? CHOLECYSTECTOMY     ? CORONARY ANGIOPLASTY     ? EYE SURGERY     ? KNEE REPLACEMENT      right   ? LAMINECTOMY     ? MYOCARDIAL PERFUSION IMG STUDY     ? SHOULDER SURGERY     ? SINUS SURGERY       Family history reviewed; non-contributory  Social History     Tobacco Use   ? Smoking status: Never   ? Smokeless tobacco: Never   Substance and Sexual Activity   ? Alcohol use: No   ? Drug use: No             Immunizations (includes history and patient reported):   Immunization History   Administered Date(s) Administered   ? COVID-19 (PFIZER), mRNA vacc, 30 mcg/0.3 mL (PF) 01/19/2020           Allergies:  Duloxetine, Lyrica [pregabalin], Demerol (pf) [meperidine (pf)], and Tuberculin ppd tine test    Medications:  Medications Prior to Admission   Medication Sig   ? amiodarone (PACERONE) 200 mg tablet Take one tablet by mouth twice daily. Take with food.   ? aspirin EC 81 mg tablet Take one tablet by mouth daily.   ? atorvastatin (LIPITOR) 20 mg tablet Take one tablet by mouth daily.   ? betamethasone dipropionate 0.05 % topical cream Apply  topically to affected area twice daily.   ? coenzyme Q10 200 mg capsule Take one capsule by mouth daily.   ? finasteride (PROSCAR) 5  mg tablet Take one tablet by mouth daily.   ? fluticasone (FLONASE) 50 mcg/actuation nasal spray Apply two sprays to each nostril as directed as Needed. Shake bottle gently before using.   ? gabapentin (NEURONTIN) 300 mg capsule Take three capsules by mouth three times daily.   ? lidocaine (LIDODERM) 5 % topical patch Apply one patch topically to affected area every 24 hours. Apply patch for 12 hours, then remove for 12 hours before repeating. (Patient taking differently: Apply one patch topically to affected area as Needed. Apply patch for 12 hours, then remove for 12 hours before repeating.)   ? MELATONIN PO Take 10 mg by mouth at bedtime daily.   ? metoprolol XL (TOPROL XL) 50 mg extended release tablet Take one tablet by mouth twice daily.   ? nitroglycerin (NITROSTAT) 0.4 mg tablet Take one tablet sublingually as needed every 5 min for chest pain X three   ? pantoprazole DR (PROTONIX) 20 mg tablet Take one tablet by mouth daily.   ? potassium chloride (KLOR-CON SPRINKLE) 10 mEq capsule Take two capsules by mouth daily. Take with a meal and a full glass of water.   ? torsemide (DEMADEX) 20 mg tablet Take one tablet by mouth daily.   ? vit A/vit C/vit E/zinc/copper (PRESERVISION AREDS PO) Take  by mouth twice daily.   ? vitamins, multiple PO Cap Take one capsule by mouth daily.   ? warfarin (COUMADIN) 4 mg tablet Take 1-2 tabs as directed by [facility]     Review of Systems:  All other systems reviewed and are negative.    Physical Exam:  Vital Signs: Last Filed In 24 Hours Vital Signs: 24 Hour Range   BP: 169/73 (09/12 2205)  Temp: 37.1 ?C (98.8 ?F) (09/12 2205)  Pulse: 59 (09/12 2205)  Respirations: 18 PER MINUTE (09/12 2205)  SpO2: 97 % (09/12 2205)  O2 Device: None (Room air) (09/12 2205) BP: (168-169)/(73-82)   Temp:  [36.8 ?C (98.3 ?F)-37.1 ?C (98.8 ?F)]   Pulse:  [59-74]   Respirations:  [18 PER MINUTE]   SpO2:  [97 %-100 %]   O2 Device: None (Room air)            General appearance: alert, cooperative and moderately obese  Lungs: clear to auscultation bilaterally  Heart: systolic murmur: crescendo decrescendo 3/6, crescendo, decrescendo at 2nd left intercostal space, at 2nd right intercostal space  Abdomen: soft, non-tender. Bowel sounds normal. No masses,  no organomegaly  Extremities: edema 4+ pitting bilateral lower extremities to mid-calf  Neurologic: Grossly normal  Alert and oriented X 3, normal strength and tone. Normal symmetric reflexes. Normal coordination and gait  Skin: Skin color, texture, turgor normal. No rashes or lesions    Lab/Radiology/Other Diagnostic Tests:  24-hour labs:    Results for orders placed or performed during the hospital encounter of 07/29/22 (from the past 24 hour(s))   CBC AND DIFF    Collection Time: 07/29/22 10:03 PM   Result Value Ref Range    White Blood Cells 6.5 4.5 - 11.0 K/UL    RBC 3.86 (L) 4.4 - 5.5 M/UL    Hemoglobin 11.7 (L) 13.5 - 16.5 GM/DL    Hematocrit 16.1 (L) 40 - 50 %    MCV 89.4 80 - 100 FL    MCH 30.3 26 - 34 PG    MCHC 33.9 32.0 - 36.0 G/DL    RDW 09.6 (H) 11 - 15 %    Platelet Count 121 (L)  150 - 400 K/UL    MPV 7.3 7 - 11 FL    Neutrophils 76 41 - 77 %    Lymphocytes 13 (L) 24 - 44 %    Monocytes 9 4 - 12 %    Eosinophils 1 0 - 5 %    Basophils 1 0 - 2 %    Absolute Neutrophil Count 4.93 1.8 - 7.0 K/UL    Absolute Lymph Count 0.85 (L) 1.0 - 4.8 K/UL    Absolute Monocyte Count 0.59 0 - 0.80 K/UL    Absolute Eosinophil Count 0.05 0 - 0.45 K/UL    Absolute Basophil Count 0.04 0 - 0.20 K/UL   BASIC METABOLIC PANEL    Collection Time: 07/29/22 10:03 PM   Result Value Ref Range    Sodium 135 (L) 137 - 147 MMOL/L    Potassium 3.9 3.5 - 5.1 MMOL/L    Chloride 99 98 - 110 MMOL/L    CO2 27 21 - 30 MMOL/L    Anion Gap 9 3 - 12    Glucose 101 (H) 70 - 100 MG/DL    Blood Urea Nitrogen 30 (H) 7 - 25 MG/DL    Creatinine 1.61 (H) 0.4 - 1.24 MG/DL    Calcium 8.1 (L) 8.5 - 10.6 MG/DL    eGFR 50 (L) >09 mL/min   THYROID STIMULATING HORMONE-TSH    Collection Time: 07/29/22 10:03 PM   Result Value Ref Range    TSH 0.78 0.35 - 5.00 MCU/ML   NT-PRO-BNP    Collection Time: 07/29/22 10:03 PM   Result Value Ref Range    NT-Pro-BNP 2,676.0 (H) <450 pg/mL   PROTIME INR (PT)    Collection Time: 07/29/22 10:03 PM   Result Value Ref Range    Protime 27.0 (H) 9.5 - 14.2 SEC    INR 2.4 (H) 0.8 - 1.2   LIPID PROFILE    Collection Time: 07/29/22 10:03 PM   Result Value Ref Range    Cholesterol 117 <200 MG/DL    Triglycerides 74 <604 MG/DL    HDL 43 >54 MG/DL    LDL 61 <098 mg/dL    VLDL 15 MG/DL    Non HDL Cholesterol 74 MG/DL        Pertinent radiology reviewed.    Hulen Luster, MBChB  Anesthesiology Resident PGY 1    Discussed with Dr Alfred Levins

## 2022-07-31 ENCOUNTER — Encounter: Admit: 2022-07-31 | Discharge: 2022-07-31 | Payer: MEDICARE

## 2022-07-31 MED ADMIN — POTASSIUM CHLORIDE 20 MEQ PO TBTQ [35943]: 40 meq | ORAL | @ 14:00:00 | NDC 00832532511

## 2022-07-31 MED ADMIN — BUMETANIDE 0.25 MG/ML IJ SOLN [9308]: 2 mg | INTRAVENOUS | @ 22:00:00 | NDC 72205010101

## 2022-07-31 MED ADMIN — DIPHENHYDRAMINE HCL 25 MG PO CAP [2509]: 25 mg | ORAL | @ 14:00:00 | NDC 00904723761

## 2022-07-31 MED ADMIN — BUMETANIDE 0.25 MG/ML IJ SOLN [9308]: 2 mg | INTRAVENOUS | @ 14:00:00 | NDC 72205010101

## 2022-07-31 MED ADMIN — DIPHENHYDRAMINE HCL 25 MG PO CAP [2509]: 25 mg | ORAL | @ 08:00:00 | NDC 00904723761

## 2022-07-31 MED ADMIN — DIPHENHYDRAMINE HCL 25 MG PO CAP [2509]: 25 mg | ORAL | @ 21:00:00 | NDC 00904723761

## 2022-07-31 MED ADMIN — POTASSIUM CHLORIDE 20 MEQ PO TBTQ [35943]: 40 meq | ORAL | @ 22:00:00 | NDC 00832532511

## 2022-08-01 ENCOUNTER — Inpatient Hospital Stay: Admit: 2022-08-01 | Discharge: 2022-08-01 | Payer: MEDICARE

## 2022-08-01 MED ADMIN — WARFARIN 2 MG PO TAB [8749]: 2 mg | ORAL | @ 02:00:00 | NDC 62584098411

## 2022-08-01 MED ADMIN — BUMETANIDE 0.25 MG/ML IJ SOLN [9308]: 2 mg | INTRAVENOUS | @ 14:00:00 | NDC 72205010101

## 2022-08-01 MED ADMIN — PREDNISONE 20 MG PO TAB [6496]: 40 mg | ORAL | @ 18:00:00 | Stop: 2022-08-01 | NDC 00054001820

## 2022-08-01 MED ADMIN — SODIUM CHLORIDE 0.9 % IV SOLP [27838]: 500 mL | INTRAVENOUS | @ 20:00:00 | Stop: 2022-08-01 | NDC 00338004903

## 2022-08-01 MED ADMIN — DIPHENHYDRAMINE HCL 25 MG PO CAP [2509]: 25 mg | ORAL | @ 03:00:00 | NDC 00904723761

## 2022-08-01 MED ADMIN — POTASSIUM CHLORIDE 20 MEQ PO TBTQ [35943]: 40 meq | ORAL | @ 22:00:00 | NDC 00832532511

## 2022-08-01 MED ADMIN — HYDROCORTISONE 2.5 % TP CREA [3727]: TOPICAL | @ 03:00:00 | NDC 00472033730

## 2022-08-01 MED ADMIN — POTASSIUM CHLORIDE 20 MEQ PO TBTQ [35943]: 40 meq | ORAL | @ 14:00:00 | NDC 00832532511

## 2022-08-01 MED ADMIN — DIPHENHYDRAMINE HCL 25 MG PO CAP [2509]: 25 mg | ORAL | @ 09:00:00 | NDC 00904723761

## 2022-08-01 MED ADMIN — DIPHENHYDRAMINE HCL 25 MG PO CAP [2509]: 25 mg | ORAL | @ 20:00:00 | NDC 00904723761

## 2022-08-02 MED ADMIN — SODIUM CHLORIDE 0.9 % IV SOLP [27838]: 500 mL | INTRAVENOUS | @ 22:00:00 | Stop: 2022-08-02 | NDC 00338004903

## 2022-08-02 MED ADMIN — ACETAMINOPHEN 325 MG PO TAB [101]: 650 mg | ORAL | @ 22:00:00 | NDC 00904677361

## 2022-08-02 MED ADMIN — POTASSIUM CHLORIDE 20 MEQ PO TBTQ [35943]: 40 meq | ORAL | @ 14:00:00 | Stop: 2022-08-02 | NDC 00832532511

## 2022-08-02 MED ADMIN — WARFARIN 1 MG PO TAB [11664]: 1 mg | ORAL | @ 01:00:00 | NDC 00832121189

## 2022-08-02 MED ADMIN — DIPHENHYDRAMINE HCL 25 MG PO CAP [2509]: 25 mg | ORAL | @ 22:00:00 | NDC 00904723761

## 2022-08-02 MED ADMIN — DIPHENHYDRAMINE HCL 25 MG PO CAP [2509]: 25 mg | ORAL | @ 03:00:00 | NDC 00904723761

## 2022-08-02 MED ADMIN — DIPHENHYDRAMINE HCL 25 MG PO CAP [2509]: 25 mg | ORAL | @ 09:00:00 | NDC 00904723761

## 2022-08-02 MED ADMIN — DIPHENHYDRAMINE HCL 25 MG PO CAP [2509]: 25 mg | ORAL | @ 15:00:00 | NDC 00904723761

## 2022-08-02 MED ADMIN — ACETAMINOPHEN 325 MG PO TAB [101]: 650 mg | ORAL | @ 14:00:00 | NDC 00904677361

## 2022-08-03 ENCOUNTER — Encounter: Admit: 2022-08-03 | Discharge: 2022-08-03 | Payer: MEDICARE

## 2022-08-03 ENCOUNTER — Inpatient Hospital Stay: Admit: 2022-08-03 | Discharge: 2022-08-02 | Payer: MEDICARE

## 2022-08-03 MED ADMIN — HYDROCORTISONE 2.5 % TP CREA [3727]: TOPICAL | @ 02:00:00 | NDC 00168008031

## 2022-08-03 MED ADMIN — DIPHENHYDRAMINE HCL 25 MG PO CAP [2509]: 25 mg | ORAL | @ 15:00:00 | Stop: 2022-08-03 | NDC 00904723761

## 2022-08-03 MED ADMIN — WARFARIN 1 MG PO TAB [11664]: 1 mg | ORAL | @ 02:00:00 | NDC 00832121189

## 2022-08-03 MED ADMIN — DIPHENHYDRAMINE HCL 25 MG PO CAP [2509]: 25 mg | ORAL | @ 03:00:00 | NDC 00904723761

## 2022-08-03 MED ADMIN — DILTIAZEM HCL 180 MG PO CP24 [29272]: 180 mg | ORAL | @ 20:00:00 | Stop: 2022-08-03 | NDC 60687020611

## 2022-08-03 MED ADMIN — TRIAMCINOLONE ACETONIDE 0.1 % TP OINT [8118]: TOPICAL | @ 16:00:00 | Stop: 2022-08-03 | NDC 51672128401

## 2022-08-03 MED ADMIN — ACETAMINOPHEN 325 MG PO TAB [101]: 650 mg | ORAL | @ 15:00:00 | Stop: 2022-08-03 | NDC 00904677361

## 2022-08-03 MED FILL — TRIAMCINOLONE ACETONIDE 0.1 % TP OINT: 0.1 % | TOPICAL | 30 days supply | Qty: 454 | Fill #1 | Status: CP

## 2022-08-03 MED FILL — LOPERAMIDE 2 MG PO CAP: 2 mg | 4 days supply | Qty: 30 | Fill #1 | Status: CP

## 2022-08-03 MED FILL — WARFARIN 1 MG PO TAB: 1 mg | 30 days supply | Qty: 60 | Fill #1 | Status: CP

## 2022-08-03 MED FILL — BUMETANIDE 1 MG PO TAB: 1 mg | ORAL | 30 days supply | Qty: 30 | Fill #1 | Status: CP

## 2022-08-03 MED FILL — DILTIAZEM HCL 180 MG PO CP24: 180 mg | ORAL | 30 days supply | Qty: 60 | Fill #1 | Status: CP

## 2022-08-03 MED FILL — HYDROCORTISONE 2.5 % TP CREA: 2.5 % | TOPICAL | 30 days supply | Qty: 28.35 | Fill #1 | Status: CP

## 2022-08-05 ENCOUNTER — Encounter: Admit: 2022-08-05 | Discharge: 2022-08-05 | Payer: MEDICARE

## 2022-08-05 DIAGNOSIS — I4819 Other persistent atrial fibrillation: Secondary | ICD-10-CM

## 2022-08-06 ENCOUNTER — Encounter: Admit: 2022-08-06 | Discharge: 2022-08-06 | Payer: MEDICARE

## 2022-08-10 ENCOUNTER — Encounter: Admit: 2022-08-10 | Discharge: 2022-08-10 | Payer: MEDICARE

## 2022-08-11 ENCOUNTER — Encounter: Admit: 2022-08-11 | Discharge: 2022-08-11 | Payer: MEDICARE

## 2022-08-12 ENCOUNTER — Encounter: Admit: 2022-08-12 | Discharge: 2022-08-12 | Payer: MEDICARE

## 2022-08-12 DIAGNOSIS — E785 Hyperlipidemia, unspecified: Secondary | ICD-10-CM

## 2022-08-12 DIAGNOSIS — Z950 Presence of cardiac pacemaker: Secondary | ICD-10-CM

## 2022-08-12 DIAGNOSIS — I495 Sick sinus syndrome: Secondary | ICD-10-CM

## 2022-08-12 DIAGNOSIS — I952 Hypotension due to drugs: Secondary | ICD-10-CM

## 2022-08-12 DIAGNOSIS — R0989 Other specified symptoms and signs involving the circulatory and respiratory systems: Secondary | ICD-10-CM

## 2022-08-12 DIAGNOSIS — I493 Ventricular premature depolarization: Secondary | ICD-10-CM

## 2022-08-12 DIAGNOSIS — G629 Polyneuropathy, unspecified: Secondary | ICD-10-CM

## 2022-08-12 DIAGNOSIS — T887XXA Unspecified adverse effect of drug or medicament, initial encounter: Secondary | ICD-10-CM

## 2022-08-12 DIAGNOSIS — E669 Obesity, unspecified: Secondary | ICD-10-CM

## 2022-08-12 DIAGNOSIS — I5042 Chronic combined systolic (congestive) and diastolic (congestive) heart failure: Secondary | ICD-10-CM

## 2022-08-12 DIAGNOSIS — R001 Bradycardia, unspecified: Secondary | ICD-10-CM

## 2022-08-12 DIAGNOSIS — Z7901 Long term (current) use of anticoagulants: Secondary | ICD-10-CM

## 2022-08-12 DIAGNOSIS — I2510 Atherosclerotic heart disease of native coronary artery without angina pectoris: Secondary | ICD-10-CM

## 2022-08-12 DIAGNOSIS — G4733 Obstructive sleep apnea (adult) (pediatric): Secondary | ICD-10-CM

## 2022-08-12 DIAGNOSIS — I4891 Unspecified atrial fibrillation: Secondary | ICD-10-CM

## 2022-08-12 DIAGNOSIS — E782 Mixed hyperlipidemia: Secondary | ICD-10-CM

## 2022-08-12 DIAGNOSIS — I4819 Other persistent atrial fibrillation: Secondary | ICD-10-CM

## 2022-08-12 DIAGNOSIS — I499 Cardiac arrhythmia, unspecified: Secondary | ICD-10-CM

## 2022-08-12 DIAGNOSIS — G4730 Sleep apnea, unspecified: Secondary | ICD-10-CM

## 2022-08-12 DIAGNOSIS — I444 Left anterior fascicular block: Secondary | ICD-10-CM

## 2022-08-12 DIAGNOSIS — I480 Paroxysmal atrial fibrillation: Secondary | ICD-10-CM

## 2022-08-12 MED ORDER — METOPROLOL SUCCINATE 25 MG PO TB24
25 mg | ORAL_TABLET | Freq: Two times a day (BID) | ORAL | 3 refills | 90.00000 days | Status: AC
Start: 2022-08-12 — End: ?

## 2022-08-12 MED ORDER — BUMETANIDE 1 MG PO TAB
1 mg | ORAL_TABLET | Freq: Every day | ORAL | 3 refills | Status: AC
Start: 2022-08-12 — End: ?

## 2022-08-12 NOTE — Progress Notes
Date of Service: 08/12/2022    Robert Mercado is a 76 y.o. male.       HPI     Robert Mercado is a 76 y.o. 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    During this admission patient was treated with IV diuretics, the ventricular rate was difficult to control due to underlying/exacerbation of atrial fibrillation and subsequent acute on chronic HFpEF.Patient was initiated on amiodarone, he did develop a diffuse morbilliform rash that was probably related to these antiarrhythmic drug therapy.  During this admission the metoprolol was discontinued, the dose of Cardizem CD was adjusted.    The bilateral lower extremity edema did respond well to IV diuretics, patient was discharged home in stable condition.    At present time he has noticed is probably secondary to chronic HFpEF as well as calcium blocker therapy.  Patient is on bumetanide 1 mg p.o. daily.  He is watching his salt intake.       Vitals:    08/12/22 1019   BP: (!) 154/84   BP Source: Arm, Left Upper   Pulse: 88   SpO2: 94%   O2 Percent: 94 %   O2 Device: None (Room air)   PainSc: Four   Weight: 130 kg (286 lb 9.6 oz)   Height: 182.9 cm (6')     Body mass index is 38.87 kg/m?Marland Kitchen     Past Medical History  Patient Active Problem List    Diagnosis Date Noted   ? Medication side effects 08/11/2022     While hospitalized in September 2023 patient was initiated on amiodarone for rate control in the setting of atrial fibrillation.  He developed an extensive, generalized micropapular rash thought to be due to amiodarone.     ? Atrial fibrillation (HCC) 07/29/2022   ? Edema of left lower extremity 02/18/2022   ? COVID-19 virus infection 10/17/2021   ? OSA (obstructive sleep apnea) 02/22/2019   ? Hospital discharge follow-up 08/09/2018   ? Combined systolic and diastolic heart failure (HCC) 07/30/2018   ? 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 Chronic fatigue 04/01/2018   ? Tenosynovitis, de Quervain 10/21/2017   ? PVC (premature ventricular contraction) 12/31/2016   ? PVC's (premature ventricular contractions) 11/06/2016     0214/18 PVC ablation - x 3 morphologies RVOT      ? Preoperative cardiovascular examination 04/28/2016   ? Hospital discharge follow-up 03/10/2016   ? Chronic fatigue 08/21/2015   ? Hypotension due to drugs 08/21/2015   ? 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 Chronic anticoagulation, on warfarin 08/16/2014   ? OSA (obstructive sleep apnea) 08/17/2013   ? Obesity (BMI 30-39.9) 08/17/2013   ? Paroxysmal atrial fibrillation (HCC) 04/19/2012   ? 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 Atypical chest pain 05/02/2011   ? SSS (sick sinus syndrome) (HCC) 05/21/2010   ? HLD (hyperlipidemia) 11/12/2009   ? CAD (coronary artery disease) 11/12/2009     08/16/14: Cath by Dr. Mackey Birchwood: 30% instent restenosis of previously placed stent in the LAD, and patent 2nd DIAG stent and 60% distal LAD disease. Medical management     ? Peripheral neuropathy 11/12/2009         Review of Systems   Constitutional: Positive for malaise/fatigue.   HENT: Positive for  tinnitus.    Eyes: Negative.    Cardiovascular: Positive for dyspnea on exertion and leg swelling.   Respiratory: Negative.    Endocrine: Negative.    Hematologic/Lymphatic: Negative.    Skin: Negative.    Musculoskeletal: Positive for back pain, joint pain and muscle weakness.   Gastrointestinal: Negative.    Genitourinary: Positive for frequency.   Neurological: Positive for weakness.   Psychiatric/Behavioral: Negative.    Allergic/Immunologic: Negative.        Physical Exam  General Appearance: obese, BMI = 38.87 kg/m?  Skin: warm, moist, no ulcers or xanthomas  Eyes: conjunctivae and lids normal, pupils are equal and round  Lips & Oral Mucosa: no pallor or cyanosis  Neck Veins: neck veins are flat, neck veins are not distended  Chest Inspection: chest is normal in appearance  Respiratory Effort: breathing comfortably, no respiratory distress  Auscultation/Percussion: lungs clear to auscultation, no rales or rhonchi, no wheezing  Cardiac Rhythm: regular rhythm and normal rate  Cardiac Auscultation: S1, S2 normal, no rub, no gallop  Murmurs: no murmur  Carotid Arteries: normal carotid upstroke bilaterally, no bruit  Abdominal aorta: could not be examined due to obese adomen  Lower Extremity Edema: 1+ bilateral pretibial edema  Abdominal Exam: soft, non-tender, no masses, bowel sounds normal  Liver & Spleen: no organomegaly  Language and Memory: patient responsive and seems to comprehend information  Neurologic Exam: neurological assessment grossly intact      Cardiovascular Studies  Twelve-lead EKG demonstrates atrial paced rhythm, prolonged AV conduction, PR interval 232 ms, RBBB, left axis deviation    Cardiovascular Health Factors  Vitals BP Readings from Last 3 Encounters:   08/12/22 (!) 154/84   08/03/22 119/81   04/15/22 110/82     Wt Readings from Last 3 Encounters:   08/12/22 130 kg (286 lb 9.6 oz)   08/01/22 124.4 kg (274 lb 3.2 oz)   04/15/22 129.2 kg (284 lb 12.8 oz)     BMI Readings from Last 3 Encounters:   08/12/22 38.87 kg/m?   08/01/22 37.19 kg/m?   04/15/22 38.63 kg/m?      Smoking Social History     Tobacco Use   Smoking Status Never   Smokeless Tobacco Never      Lipid Profile Cholesterol   Date Value Ref Range Status   07/29/2022 117 <200 MG/DL Final     HDL   Date Value Ref Range Status   07/29/2022 43 >40 MG/DL Final     LDL   Date Value Ref Range Status   07/29/2022 61 <100 mg/dL Final     Triglycerides   Date Value Ref Range Status   07/29/2022 74 <150 MG/DL Final      Blood Sugar No results found for: HGBA1C  Glucose   Date Value Ref Range Status   08/03/2022 91 70 - 100 MG/DL Final   81/19/1478 295 (H) 70 - 100 MG/DL Final 62/13/0865 784 (H) 70 - 100 MG/DL Final   69/62/9528 76 65 - 99 mg/dL Final     Comment:                   Fasting reference interval             Problems Addressed Today  Encounter Diagnoses   Name Primary?   ? Mixed hyperlipidemia Yes   ? Coronary artery disease involving native coronary artery of native heart without angina pectoris    ? SSS (sick sinus syndrome) (HCC)    ?  Paroxysmal atrial fibrillation (HCC)    ? LAFB (left anterior fascicular block)    ? Hypotension due to drugs    ? PVC's (premature ventricular contractions)    ? Chronic combined systolic and diastolic heart failure (HCC)    ? Persistent atrial fibrillation (HCC)    ? Cardiac pacemaker in situ    ? Obesity (BMI 30-39.9)    ? Chronic anticoagulation, on warfarin    ? OSA (obstructive sleep apnea)    ? Medication side effects    ? Cardiovascular symptoms        Assessment and Plan     Assessment:    1.  Hospital discharge follow-up-patient was admitted between 07/29/2022 and 08/03/2022  2.  Acute on chronic combined systolic and diastolic heart failure  ? This probably occurred due to heart failure exacerbation with poorly controlled ventricular response  ? He did respond well to IV bumetanide  ? Upon admission NT proBNP = 2676  ? Patient bilateral extremity edema resolved, he was discharged home on p.o. bumetanide  ? 3.  Paroxysmal atrial fibrillation  ? Patient's medications have been adjusted, he was initiated on amiodarone  ? Patient felt better 1 amiodarone was initiated, the rhythm and ventricular response work well controlled, however he could not be continued on this medication due to #4  4.  Diffuse, extensive morbilliform rash after amiodarone initiation-this probably represented a side effect  5.  Coronary artery disease-no symptoms of chest pain, asymptomatic  ? PTCA of the LAD in April 2010  ? PTCA of diagonal branch in February 2013  ? Status post LHC in December 2021 in [place], [place] - mid LAD lesion 55%, IFR did not indicate any hemodynamically significant stenosis, D1 lesion 70% stenosis, this is known from before and this is a small to medium size vessel, second diagonal 50% stenosis is a small vessel  6.  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  History of sick sinus syndrome-status post PPM implant in July 2011  8.  Status post PVC ablation in February 2018-no recurrence  9.  Mild aortic valve stenosis-2D echo Doppler study performed during hospitalization and dated 08/16/2022 demonstrated MG = 13 mmHg  10.  OSA-uses a CPAP machine    Plan:    1.  Increase bumetanide to 1 mg p.o. twice daily for the next 48-72 hours to decrease bilateral lower extremity edema  2.  Increase potassium replacement when increasing the bumetanide dose  3.  Continue a strict low-sodium diet  4.  Follow-up office visit in approximately 3 months      Total Time Today was 40 minutes in the following activities: Preparing to see the patient, Obtaining and/or reviewing separately obtained history, Performing a medically appropriate examination and/or evaluation, Counseling and educating the patient/family/caregiver, Ordering medications, tests, or procedures, Referring and communication with other health care professionals (when not separately reported), Documenting clinical information in the electronic or other health record, Independently interpreting results (not separately reported) and communicating results to the patient/family/caregiver and Care coordination (not separately reported)           Current Medications (including today's revisions)  ? aspirin EC 81 mg tablet Take one tablet by mouth daily.   ? atorvastatin (LIPITOR) 20 mg tablet Take one tablet by mouth daily. (Patient taking differently: Take one tablet by mouth at bedtime daily.)   ? bumetanide (BUMEX) 1 mg tablet Take one tablet by mouth daily.   ? coenzyme  Q10 200 mg capsule Take one capsule by mouth at bedtime daily.   ? dilTIAZem CD (CARDIZEM CD) 180 mg capsule Take one capsule by mouth twice daily. Indications: paroxysmal supraventricular tachycardia   ? diphenhydrAMINE HCL (BENADRYL) 25 mg capsule Take one capsule by mouth every 6 hours as needed. Indications: hives   ? finasteride (PROSCAR) 5 mg tablet Take one tablet by mouth at bedtime daily.   ? fluticasone (FLONASE) 50 mcg/actuation nasal spray Apply two sprays to each nostril as directed as Needed. Shake bottle gently before using.   ? gabapentin (NEURONTIN) 300 mg capsule Take three capsules by mouth three times daily.   ? hydrocortisone (HYTONE) 2.5 % topical cream Apply  topically to affected area twice daily. Apply to face, groin, and underarm.  Do not use for more than 2 weeks.   ? loperamide (IMODIUM A-D) 2 mg capsule Take by mouth as needed every 2 hours for diarrhea.  Do not take more than 8 tablets daily.  Indications: diarrhea   ? MELATONIN PO Take 10 mg by mouth at bedtime daily.   ? multivitamin (ONE-A-DAY) tablet Take one tablet by mouth daily.   ? nitroglycerin (NITROSTAT) 0.4 mg tablet Take one tablet sublingually as needed every 5 min for chest pain X three   ? pantoprazole DR (PROTONIX) 20 mg tablet Take one tablet by mouth twice daily.   ? potassium chloride (KLOR-CON SPRINKLE) 10 mEq capsule Take two capsules by mouth daily. Take with a meal and a full glass of water.   ? triamcinolone acetonide (KENALOG) 0.1 % topical ointment Apply  topically to affected area twice daily. On body.  Do not use for more than 2 weeks   ? vit A/vit C/vit E/zinc/copper (PRESERVISION AREDS PO) Take 1 tablet by mouth twice daily.   ? warfarin (COUMADIN) 1 mg tablet Take 1-2 tablets by mouth in combination with 4 mg tablets as directed by PCP   ? warfarin (COUMADIN) 4 mg tablet Take one tablet by mouth daily. **DO NOT TAKE THIS UNTIL INDICATED BASED ON INR RESULTS**

## 2022-08-20 ENCOUNTER — Encounter: Admit: 2022-08-20 | Discharge: 2022-08-20 | Payer: MEDICARE

## 2022-08-20 DIAGNOSIS — I4819 Other persistent atrial fibrillation: Secondary | ICD-10-CM

## 2022-08-20 DIAGNOSIS — Z7901 Long term (current) use of anticoagulants: Secondary | ICD-10-CM

## 2022-08-20 DIAGNOSIS — I480 Paroxysmal atrial fibrillation: Secondary | ICD-10-CM

## 2022-08-20 LAB — PROTIME INR (PT): INR: 2.1

## 2022-08-21 ENCOUNTER — Encounter: Admit: 2022-08-21 | Discharge: 2022-08-21 | Payer: MEDICARE

## 2022-08-24 ENCOUNTER — Encounter: Admit: 2022-08-24 | Discharge: 2022-08-24 | Payer: MEDICARE

## 2022-08-25 ENCOUNTER — Encounter: Admit: 2022-08-25 | Discharge: 2022-08-25 | Payer: MEDICARE

## 2022-08-25 DIAGNOSIS — I4819 Other persistent atrial fibrillation: Secondary | ICD-10-CM

## 2022-08-25 MED ORDER — DILTIAZEM HCL 180 MG PO CP24
180 mg | ORAL_CAPSULE | Freq: Two times a day (BID) | ORAL | 3 refills | 90.00000 days | Status: AC
Start: 2022-08-25 — End: ?

## 2022-08-31 ENCOUNTER — Encounter: Admit: 2022-08-31 | Discharge: 2022-08-31 | Payer: MEDICARE

## 2022-08-31 DIAGNOSIS — I2510 Atherosclerotic heart disease of native coronary artery without angina pectoris: Secondary | ICD-10-CM

## 2022-08-31 DIAGNOSIS — I5042 Chronic combined systolic (congestive) and diastolic (congestive) heart failure: Secondary | ICD-10-CM

## 2022-08-31 DIAGNOSIS — E782 Mixed hyperlipidemia: Secondary | ICD-10-CM

## 2022-08-31 MED ORDER — POTASSIUM CHLORIDE 10 MEQ PO CPER
ORAL_CAPSULE | 10 refills
Start: 2022-08-31 — End: ?

## 2022-09-01 ENCOUNTER — Encounter: Admit: 2022-09-01 | Discharge: 2022-09-01 | Payer: MEDICARE

## 2022-09-01 DIAGNOSIS — I4819 Other persistent atrial fibrillation: Secondary | ICD-10-CM

## 2022-09-08 ENCOUNTER — Encounter: Admit: 2022-09-08 | Discharge: 2022-09-08 | Payer: MEDICARE

## 2022-09-08 DIAGNOSIS — Z7901 Long term (current) use of anticoagulants: Secondary | ICD-10-CM

## 2022-09-08 DIAGNOSIS — I480 Paroxysmal atrial fibrillation: Secondary | ICD-10-CM

## 2022-09-15 ENCOUNTER — Encounter: Admit: 2022-09-15 | Discharge: 2022-09-15 | Payer: MEDICARE

## 2022-09-15 DIAGNOSIS — I480 Paroxysmal atrial fibrillation: Secondary | ICD-10-CM

## 2022-09-15 DIAGNOSIS — Z7901 Long term (current) use of anticoagulants: Secondary | ICD-10-CM

## 2022-09-15 DIAGNOSIS — I4819 Other persistent atrial fibrillation: Secondary | ICD-10-CM

## 2022-09-18 ENCOUNTER — Encounter: Admit: 2022-09-18 | Discharge: 2022-09-18 | Payer: MEDICARE

## 2022-09-18 MED ORDER — ATORVASTATIN 20 MG PO TAB
20 mg | ORAL_TABLET | Freq: Every day | 3 refills | Status: AC
Start: 2022-09-18 — End: ?

## 2022-09-21 ENCOUNTER — Encounter: Admit: 2022-09-21 | Discharge: 2022-09-21 | Payer: MEDICARE

## 2022-09-22 ENCOUNTER — Encounter: Admit: 2022-09-22 | Discharge: 2022-09-22 | Payer: MEDICARE

## 2022-09-22 DIAGNOSIS — I480 Paroxysmal atrial fibrillation: Secondary | ICD-10-CM

## 2022-09-22 DIAGNOSIS — I4819 Other persistent atrial fibrillation: Secondary | ICD-10-CM

## 2022-09-22 DIAGNOSIS — Z7901 Long term (current) use of anticoagulants: Secondary | ICD-10-CM

## 2022-09-29 ENCOUNTER — Encounter: Admit: 2022-09-29 | Discharge: 2022-09-29 | Payer: MEDICARE

## 2022-09-29 DIAGNOSIS — Z7901 Long term (current) use of anticoagulants: Secondary | ICD-10-CM

## 2022-09-29 DIAGNOSIS — I480 Paroxysmal atrial fibrillation: Secondary | ICD-10-CM

## 2022-10-06 ENCOUNTER — Encounter: Admit: 2022-10-06 | Discharge: 2022-10-06 | Payer: MEDICARE

## 2022-10-06 DIAGNOSIS — I480 Paroxysmal atrial fibrillation: Secondary | ICD-10-CM

## 2022-10-06 DIAGNOSIS — Z7901 Long term (current) use of anticoagulants: Secondary | ICD-10-CM

## 2022-10-16 ENCOUNTER — Encounter: Admit: 2022-10-16 | Discharge: 2022-10-16 | Payer: MEDICARE

## 2022-10-20 ENCOUNTER — Encounter: Admit: 2022-10-20 | Discharge: 2022-10-20 | Payer: MEDICARE

## 2022-10-20 DIAGNOSIS — I4819 Other persistent atrial fibrillation: Secondary | ICD-10-CM

## 2022-10-24 ENCOUNTER — Encounter: Admit: 2022-10-24 | Discharge: 2022-10-24 | Payer: MEDICARE

## 2022-10-28 ENCOUNTER — Encounter: Admit: 2022-10-28 | Discharge: 2022-10-28 | Payer: MEDICARE

## 2022-10-28 DIAGNOSIS — I480 Paroxysmal atrial fibrillation: Secondary | ICD-10-CM

## 2022-10-28 DIAGNOSIS — I4819 Other persistent atrial fibrillation: Secondary | ICD-10-CM

## 2022-10-28 DIAGNOSIS — Z7901 Long term (current) use of anticoagulants: Secondary | ICD-10-CM

## 2022-11-03 ENCOUNTER — Encounter: Admit: 2022-11-03 | Discharge: 2022-11-03 | Payer: MEDICARE

## 2022-11-03 DIAGNOSIS — I4819 Other persistent atrial fibrillation: Secondary | ICD-10-CM

## 2022-11-03 DIAGNOSIS — I480 Paroxysmal atrial fibrillation: Secondary | ICD-10-CM

## 2022-11-03 DIAGNOSIS — Z7901 Long term (current) use of anticoagulants: Secondary | ICD-10-CM

## 2022-11-06 ENCOUNTER — Encounter: Admit: 2022-11-06 | Discharge: 2022-11-06 | Payer: MEDICARE

## 2022-11-06 ENCOUNTER — Ambulatory Visit: Admit: 2022-11-06 | Discharge: 2022-11-06 | Payer: MEDICARE

## 2022-11-06 DIAGNOSIS — I4819 Other persistent atrial fibrillation: Secondary | ICD-10-CM

## 2022-11-06 DIAGNOSIS — I495 Sick sinus syndrome: Secondary | ICD-10-CM

## 2022-11-11 ENCOUNTER — Encounter: Admit: 2022-11-11 | Discharge: 2022-11-11 | Payer: MEDICARE

## 2022-11-11 DIAGNOSIS — I952 Hypotension due to drugs: Secondary | ICD-10-CM

## 2022-11-11 DIAGNOSIS — I493 Ventricular premature depolarization: Secondary | ICD-10-CM

## 2022-11-11 DIAGNOSIS — E782 Mixed hyperlipidemia: Secondary | ICD-10-CM

## 2022-11-11 DIAGNOSIS — R0989 Other specified symptoms and signs involving the circulatory and respiratory systems: Secondary | ICD-10-CM

## 2022-11-11 DIAGNOSIS — I495 Sick sinus syndrome: Secondary | ICD-10-CM

## 2022-11-11 DIAGNOSIS — G4730 Sleep apnea, unspecified: Secondary | ICD-10-CM

## 2022-11-11 DIAGNOSIS — I2510 Atherosclerotic heart disease of native coronary artery without angina pectoris: Secondary | ICD-10-CM

## 2022-11-11 DIAGNOSIS — G609 Hereditary and idiopathic neuropathy, unspecified: Secondary | ICD-10-CM

## 2022-11-11 DIAGNOSIS — E669 Obesity, unspecified: Secondary | ICD-10-CM

## 2022-11-11 DIAGNOSIS — R001 Bradycardia, unspecified: Secondary | ICD-10-CM

## 2022-11-11 DIAGNOSIS — I480 Paroxysmal atrial fibrillation: Secondary | ICD-10-CM

## 2022-11-11 DIAGNOSIS — G629 Polyneuropathy, unspecified: Secondary | ICD-10-CM

## 2022-11-11 DIAGNOSIS — Z950 Presence of cardiac pacemaker: Secondary | ICD-10-CM

## 2022-11-11 DIAGNOSIS — I5042 Chronic combined systolic (congestive) and diastolic (congestive) heart failure: Secondary | ICD-10-CM

## 2022-11-11 DIAGNOSIS — I499 Cardiac arrhythmia, unspecified: Secondary | ICD-10-CM

## 2022-11-11 DIAGNOSIS — G4733 Obstructive sleep apnea (adult) (pediatric): Secondary | ICD-10-CM

## 2022-11-11 DIAGNOSIS — E785 Hyperlipidemia, unspecified: Secondary | ICD-10-CM

## 2022-11-11 DIAGNOSIS — Z7901 Long term (current) use of anticoagulants: Secondary | ICD-10-CM

## 2022-11-11 DIAGNOSIS — I4819 Other persistent atrial fibrillation: Secondary | ICD-10-CM

## 2022-11-11 DIAGNOSIS — I444 Left anterior fascicular block: Secondary | ICD-10-CM

## 2022-11-11 DIAGNOSIS — I4891 Unspecified atrial fibrillation: Secondary | ICD-10-CM

## 2022-11-11 DIAGNOSIS — U071 COVID-19 virus infection: Secondary | ICD-10-CM

## 2022-11-11 MED ORDER — BUMETANIDE 2 MG PO TAB
2 mg | ORAL_TABLET | Freq: Every day | ORAL | 3 refills | Status: AC
Start: 2022-11-11 — End: ?

## 2022-11-11 MED ORDER — POTASSIUM CHLORIDE 20 MEQ PO TBTQ
40 meq | ORAL_TABLET | Freq: Every day | ORAL | 3 refills | 30.00000 days | Status: AC
Start: 2022-11-11 — End: ?

## 2022-11-11 NOTE — Progress Notes
Date of Service: 11/11/2022    Robert Mercado is a 76 y.o. male.       HPI       Robert Mercado is a 76 y.o. 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    Patient does not report symptoms of chest pain, no heart palpitations.  He does report bilateral lower extremity edema.  He is not completely compliant with the loop diuretic, he does not take this medication where he supposed to go out of the house and run errands.  Sometimes he does not take him when he comes back to the house.  It is not certain whether he is completely compliant with a strict low-sodium diet.         Vitals:    11/11/22 1004   BP: 112/78   BP Source: Arm, Left Upper   Pulse: 71   SpO2: 95%   O2 Device: None (Room air)   PainSc: Zero   Weight: 134.9 kg (297 lb 6.4 oz)   Height: 182.9 cm (6')     Body mass index is 40.33 kg/m?Marland Kitchen     Past Medical History  Patient Active Problem List    Diagnosis Date Noted    Medication side effects 08/11/2022     While hospitalized in September 2023 patient was initiated on amiodarone for rate control in the setting of atrial fibrillation.  He developed an extensive, generalized micropapular rash thought to be due to amiodarone.      Atrial fibrillation (HCC) 07/29/2022    Edema of left lower extremity 02/18/2022    COVID-19 virus infection 10/17/2021    OSA (obstructive sleep apnea) 02/22/2019    Hospital discharge follow-up 08/09/2018    Combined systolic and diastolic heart failure (HCC) 07/30/2018 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    Chronic fatigue 04/01/2018    Tenosynovitis, de Quervain 10/21/2017    PVC (premature ventricular contraction) 12/31/2016    PVC's (premature ventricular contractions) 11/06/2016     0214/18 PVC ablation - x 3 morphologies RVOT       Preoperative cardiovascular examination 04/28/2016    Hospital discharge follow-up 03/10/2016    Chronic fatigue 08/21/2015    Hypotension due to drugs 08/21/2015    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 Mackey Birchwood: 30% instent restenosis of previously placed stent in the LAD, and patent 2nd DIAG stent and 60% distal LAD disease. Medical management      Peripheral neuropathy 11/12/2009         Review of Systems   Constitutional: Negative.   HENT: Negative.     Eyes: Negative.    Cardiovascular: Negative.    Respiratory: Negative.  Endocrine: Negative.    Hematologic/Lymphatic: Negative.    Skin: Negative.    Musculoskeletal: Negative.    Gastrointestinal: Negative.    Genitourinary: Negative.    Neurological: Negative.    Psychiatric/Behavioral: Negative.     Allergic/Immunologic: Negative.        Physical Exam  General Appearance: normal in appearance  Skin: warm, moist, no ulcers or xanthomas  Eyes: conjunctivae and lids normal, pupils are equal and round  Lips & Oral Mucosa: no pallor or cyanosis  Neck Veins: neck veins are flat, neck veins are not distended  Chest Inspection: chest is normal in appearance  Respiratory Effort: breathing comfortably, no respiratory distress  Auscultation/Percussion: lungs clear to auscultation, no rales or rhonchi, no wheezing  Cardiac Rhythm: regular rhythm and normal rate  Cardiac Auscultation: S1, S2 normal, no rub, no gallop  Murmurs: no murmur  Carotid Arteries: normal carotid upstroke bilaterally, no bruit  Lower Extremity Edema: 2+ bilateral lower extremity edema  Abdominal Exam: soft, non-tender, no masses, bowel sounds normal  Liver & Spleen: no organomegaly  Language and Memory: patient responsive and seems to comprehend information  Neurologic Exam: neurological assessment grossly intact      Cardiovascular Studies  Twelve-lead EKG demonstrates atrial paced rhythm, RBBB, LAD, left anterior fascicular block, nonspecific ST segment T wave changes in the setting of RBBB    Cardiovascular Health Factors  Vitals BP Readings from Last 3 Encounters:   11/11/22 112/78   08/12/22 (!) 154/84   08/03/22 119/81     Wt Readings from Last 3 Encounters:   11/11/22 134.9 kg (297 lb 6.4 oz)   08/12/22 130 kg (286 lb 9.6 oz)   08/01/22 124.4 kg (274 lb 3.2 oz)     BMI Readings from Last 3 Encounters:   11/11/22 40.33 kg/m?   08/12/22 38.87 kg/m?   08/01/22 37.19 kg/m?      Smoking Social History     Tobacco Use   Smoking Status Never   Smokeless Tobacco Never      Lipid Profile Cholesterol   Date Value Ref Range Status   07/29/2022 117 <200 MG/DL Final     HDL   Date Value Ref Range Status   07/29/2022 43 >40 MG/DL Final     LDL   Date Value Ref Range Status   07/29/2022 61 <100 mg/dL Final     Triglycerides   Date Value Ref Range Status   07/29/2022 74 <150 MG/DL Final      Blood Sugar No results found for: HGBA1C  Glucose   Date Value Ref Range Status   08/03/2022 91 70 - 100 MG/DL Final   16/08/9603 540 (H) 70 - 100 MG/DL Final   98/09/9146 829 (H) 70 - 100 MG/DL Final   56/21/3086 76 65 - 99 mg/dL Final     Comment:                   Fasting reference interval             Problems Addressed Today  Encounter Diagnoses   Name Primary?    Mixed hyperlipidemia Yes    Coronary artery disease involving native coronary artery of native heart without angina pectoris     SSS (sick sinus syndrome) (HCC)     Paroxysmal atrial fibrillation (HCC)     LAFB (left anterior fascicular block)     Hypotension due to drugs     PVC's (premature ventricular contractions)     Chronic combined  systolic and diastolic heart failure (HCC)     Persistent atrial fibrillation (HCC)     Hereditary peripheral neuropathy     Cardiac pacemaker in situ     OSA (obstructive sleep apnea)     Obesity (BMI 30-39.9)     Chronic anticoagulation, on warfarin     COVID-19 virus infection     Cardiovascular symptoms        Assessment and Plan     Assessment:    1.  Acute on chronic combined systolic and diastolic heart failure  This occurred due to heart failure exacerbation in the setting of atrial fibrillation with poorly controlled ventricular response while admitted at [facility] in September 2023  Patient was treated with IV diuretics and continued on p.o. bumetanide at home  2.  Bilateral lower extremity edema due to #1  3.  Allergic reaction to amiodarone  Patient developed a diffuse, extensive morbilliform rash after amiodarone initiation  4.  Coronary artery disease-no symptoms of chest pain, no use of sublingual nitroglycerin  PTCA of the LAD in April 2010  PTCA of diagonal branch in February 2013  Status post LHC in December 2021 in [place], [place] - mid LAD lesion 55%, IFR did not indicate any hemodynamically significant stenosis, D1 lesion 70% stenosis, this is known from before and this is a small to medium size vessel, second diagonal 50% stenosis is a small vessel  5.  History of sick sinus syndrome-status post PPM implant in July 2011  6.  Status post PVC ablation in February 2018-no recurrence  7.  Paroxysmal atrial fibrillation-patient continues to be on vitamin K antagonist, the ventricular rate is currently controlled and he is in normal sinus  PPM checks did not demonstrate recurrence of ventricular arrhythmias  8.  Mild aortic valve stenosis  2D echo Doppler study dated 08/16/2022 demonstrated mildly increased MG = 13 mmHg  9.  History of COVID-19 viral infection  10.  OSA-using a CPAP machine    Plan:    1.  Increase bumetanide to 2 mg p.o. daily  2.  In the crease potassium replacement to 40 mg p.o. daily  3.  Continue all other cardiac medications  4.  Chem-7 with your office in 7 days  5.  Follow-up office visit in March 2024      Total Time Today was 40 minutes in the following activities: Preparing to see the patient, Obtaining and/or reviewing separately obtained history, Performing a medically appropriate examination and/or evaluation, Counseling and educating the patient/family/caregiver, Ordering medications, tests, or procedures, Referring and communication with other health care professionals (when not separately reported), Documenting clinical information in the electronic or other health record, Independently interpreting results (not separately reported) and communicating results to the patient/family/caregiver, and Care coordination (not separately reported)          Current Medications (including today's revisions)   aspirin EC 81 mg tablet Take one tablet by mouth daily.    atorvastatin (LIPITOR) 20 mg tablet TAKE 1 TABLET EVERY DAY    bumetanide (BUMEX) 1 mg tablet Take one tablet by mouth daily. Indications: visible water retention    coenzyme Q10 200 mg capsule Take one capsule by mouth at bedtime daily.    dilTIAZem CD (CARDIZEM CD) 180 mg capsule Take one capsule by mouth twice daily. Indications: paroxysmal supraventricular tachycardia    diphenhydrAMINE HCL (BENADRYL) 25 mg capsule Take one capsule by mouth every 6 hours as needed. Indications: hives    FEROSUL 325 mg (  65 mg iron) tablet Take 65 mg by mouth three times weekly.    finasteride (PROSCAR) 5 mg tablet Take one tablet by mouth at bedtime daily.    fluticasone (FLONASE) 50 mcg/actuation nasal spray Apply two sprays to each nostril as directed as Needed. Shake bottle gently before using.    gabapentin (NEURONTIN) 300 mg capsule Take three capsules by mouth three times daily.    hydrocortisone (HYTONE) 2.5 % topical cream Apply  topically to affected area twice daily. Apply to face, groin, and underarm.  Do not use for more than 2 weeks.    loperamide (IMODIUM A-D) 2 mg capsule Take by mouth as needed every 2 hours for diarrhea.  Do not take more than 8 tablets daily.  Indications: diarrhea    MELATONIN PO Take 10 mg by mouth at bedtime daily.    metoprolol succinate XL (TOPROL XL) 25 mg extended release tablet Take one tablet by mouth twice daily. Indications: high blood pressure    multivitamin (ONE-A-DAY) tablet Take one tablet by mouth daily.    nitroglycerin (NITROSTAT) 0.4 mg tablet Take one tablet sublingually as needed every 5 min for chest pain X three    pantoprazole DR (PROTONIX) 20 mg tablet Take one tablet by mouth twice daily.    potassium chloride (KLOR-CON SPRINKLE) 10 mEq capsule TAKE 2 CAPSULES DAILY WITH A MEAL AND A FULL GLASS OF WATER.    triamcinolone acetonide (KENALOG) 0.1 % topical ointment Apply  topically to affected area twice daily. On body.  Do not use for more than 2 weeks    vit A/vit C/vit E/zinc/copper (PRESERVISION AREDS PO) Take 1 tablet by mouth twice daily.    warfarin (COUMADIN) 1 mg tablet Take 1-2 tablets by mouth in combination with 4 mg tablets as directed by PCP    warfarin (COUMADIN) 4 mg tablet Take one tablet by mouth daily. **DO NOT TAKE THIS UNTIL INDICATED BASED ON INR RESULTS**

## 2022-11-17 ENCOUNTER — Encounter: Admit: 2022-11-17 | Discharge: 2022-11-17 | Payer: MEDICARE

## 2022-11-17 DIAGNOSIS — Z7901 Long term (current) use of anticoagulants: Secondary | ICD-10-CM

## 2022-11-17 DIAGNOSIS — I4819 Other persistent atrial fibrillation: Secondary | ICD-10-CM

## 2022-11-17 DIAGNOSIS — I480 Paroxysmal atrial fibrillation: Secondary | ICD-10-CM

## 2022-11-17 LAB — PROTIME INR (PT): INR: 2

## 2022-12-02 ENCOUNTER — Encounter: Admit: 2022-12-02 | Discharge: 2022-12-02 | Payer: MEDICARE

## 2022-12-03 ENCOUNTER — Encounter: Admit: 2022-12-03 | Discharge: 2022-12-03 | Payer: MEDICARE

## 2022-12-03 DIAGNOSIS — Z7901 Long term (current) use of anticoagulants: Secondary | ICD-10-CM

## 2022-12-03 DIAGNOSIS — I480 Paroxysmal atrial fibrillation: Secondary | ICD-10-CM

## 2022-12-03 NOTE — Telephone Encounter
-----  Message from Delorise Shiner, RN sent at 12/02/2022  3:44 PM CST -----  Regarding: FW: [facility]- ongoing AF    ----- Message -----  From: Maudie Flakes, RN  Sent: 12/02/2022   8:54 AM CST  To: [facility] Nurse [place]  Subject: [place]- ongoing AF                                  Good morning,   Alert from 12/02/22 0606 shows ongoing AF since 12/01/22 2124. AF episode > 6 hours. Please follow up as needed. Patients V rates >100 BPM ~ 20% of the time. Currently on [place].     Thanks,  Therapist, nutritional

## 2022-12-03 NOTE — Telephone Encounter
Called patient to discuss symptoms. Patient states that overall is he doing well with no complaints. Patient verbalized upcoming appointment with [facility] 01/29/23. Urged patient to reach out if he has questions or concerns. Patient verbalized understanding.

## 2022-12-09 ENCOUNTER — Encounter: Admit: 2022-12-09 | Discharge: 2022-12-09 | Payer: MEDICARE

## 2022-12-10 ENCOUNTER — Encounter: Admit: 2022-12-10 | Discharge: 2022-12-10 | Payer: MEDICARE

## 2022-12-10 DIAGNOSIS — Z7901 Long term (current) use of anticoagulants: Secondary | ICD-10-CM

## 2022-12-10 DIAGNOSIS — I480 Paroxysmal atrial fibrillation: Secondary | ICD-10-CM

## 2022-12-10 LAB — PROTIME INR (PT): INR: 1.9

## 2022-12-17 ENCOUNTER — Encounter: Admit: 2022-12-17 | Discharge: 2022-12-17 | Payer: MEDICARE

## 2022-12-17 DIAGNOSIS — I480 Paroxysmal atrial fibrillation: Secondary | ICD-10-CM

## 2022-12-17 DIAGNOSIS — Z7901 Long term (current) use of anticoagulants: Secondary | ICD-10-CM

## 2022-12-26 ENCOUNTER — Encounter: Admit: 2022-12-26 | Discharge: 2022-12-26 | Payer: MEDICARE

## 2022-12-26 NOTE — Telephone Encounter
Received a call from [facility] radiology dept-MRI requesting permission for [employer] to place the patient's pacemaker in scan mode while he has an MRI completed. Discussed this with Dr. Virgina Organ in clinic. She gives approval for this.

## 2022-12-30 ENCOUNTER — Encounter: Admit: 2022-12-30 | Discharge: 2022-12-30 | Payer: MEDICARE

## 2022-12-30 DIAGNOSIS — I480 Paroxysmal atrial fibrillation: Secondary | ICD-10-CM

## 2022-12-30 DIAGNOSIS — Z7901 Long term (current) use of anticoagulants: Secondary | ICD-10-CM

## 2022-12-30 LAB — PROTIME INR (PT): INR: 3.2 K/UL — ABNORMAL HIGH (ref 0–0.45)

## 2022-12-30 NOTE — Telephone Encounter
Patient was with his wife while she was seeing Dr. Virgina Organ and asked for approval to hold warfarin for 3 days prior to epidural injections.     Dr. Virgina Organ gives approval for patient to hold warfarin for 3 days prior to epidural injections.

## 2023-01-07 ENCOUNTER — Encounter: Admit: 2023-01-07 | Discharge: 2023-01-07 | Payer: MEDICARE

## 2023-01-07 DIAGNOSIS — Z7901 Long term (current) use of anticoagulants: Secondary | ICD-10-CM

## 2023-01-07 DIAGNOSIS — I4819 Other persistent atrial fibrillation: Secondary | ICD-10-CM

## 2023-01-07 DIAGNOSIS — I480 Paroxysmal atrial fibrillation: Secondary | ICD-10-CM

## 2023-01-10 ENCOUNTER — Encounter: Admit: 2023-01-10 | Discharge: 2023-01-10 | Payer: MEDICARE

## 2023-01-12 ENCOUNTER — Encounter: Admit: 2023-01-12 | Discharge: 2023-01-12 | Payer: MEDICARE

## 2023-01-12 NOTE — Telephone Encounter
Robert Mercado sent in [place] message that he was admitted to [facility] in [place], [place] last week.  Records are available in Care everywhere.  He was admitted with a NSTEMI and a heart cath was performed.  Requested images from [place] for images to be uploaded to cloud via nuance.      Called pt and he states that he is still not feeling great and would like to see Dr. Virgina Organ for evaluation and follow up.  Scheduled pt with [facility] in [place] on 2/27 at 1:30.  Pt is agreeable to plan and confirmed appt time and location.  He will go to the ED with any worsening symptoms.

## 2023-01-13 ENCOUNTER — Encounter: Admit: 2023-01-13 | Discharge: 2023-01-13 | Payer: MEDICARE

## 2023-01-13 DIAGNOSIS — E669 Obesity, unspecified: Secondary | ICD-10-CM

## 2023-01-13 DIAGNOSIS — E782 Mixed hyperlipidemia: Secondary | ICD-10-CM

## 2023-01-13 DIAGNOSIS — I495 Sick sinus syndrome: Secondary | ICD-10-CM

## 2023-01-13 DIAGNOSIS — R001 Bradycardia, unspecified: Secondary | ICD-10-CM

## 2023-01-13 DIAGNOSIS — E785 Hyperlipidemia, unspecified: Secondary | ICD-10-CM

## 2023-01-13 DIAGNOSIS — I4891 Unspecified atrial fibrillation: Secondary | ICD-10-CM

## 2023-01-13 DIAGNOSIS — I444 Left anterior fascicular block: Secondary | ICD-10-CM

## 2023-01-13 DIAGNOSIS — Z950 Presence of cardiac pacemaker: Secondary | ICD-10-CM

## 2023-01-13 DIAGNOSIS — Z09 Encounter for follow-up examination after completed treatment for conditions other than malignant neoplasm: Secondary | ICD-10-CM

## 2023-01-13 DIAGNOSIS — I952 Hypotension due to drugs: Secondary | ICD-10-CM

## 2023-01-13 DIAGNOSIS — I2510 Atherosclerotic heart disease of native coronary artery without angina pectoris: Secondary | ICD-10-CM

## 2023-01-13 DIAGNOSIS — I499 Cardiac arrhythmia, unspecified: Secondary | ICD-10-CM

## 2023-01-13 DIAGNOSIS — G4733 Obstructive sleep apnea (adult) (pediatric): Secondary | ICD-10-CM

## 2023-01-13 DIAGNOSIS — Z7901 Long term (current) use of anticoagulants: Secondary | ICD-10-CM

## 2023-01-13 DIAGNOSIS — I4819 Other persistent atrial fibrillation: Secondary | ICD-10-CM

## 2023-01-13 DIAGNOSIS — I5042 Chronic combined systolic (congestive) and diastolic (congestive) heart failure: Secondary | ICD-10-CM

## 2023-01-13 DIAGNOSIS — I493 Ventricular premature depolarization: Secondary | ICD-10-CM

## 2023-01-13 DIAGNOSIS — I480 Paroxysmal atrial fibrillation: Secondary | ICD-10-CM

## 2023-01-13 DIAGNOSIS — G629 Polyneuropathy, unspecified: Secondary | ICD-10-CM

## 2023-01-13 DIAGNOSIS — Z136 Encounter for screening for cardiovascular disorders: Secondary | ICD-10-CM

## 2023-01-13 DIAGNOSIS — G4730 Sleep apnea, unspecified: Secondary | ICD-10-CM

## 2023-01-13 NOTE — Progress Notes
PMH: Afib; Coumadin; HTN; CAD     D/C from [place] 2/22 for chest pain  Attempted cath, but issue with vessel    Sent to ED by Dr. Virgina Organ to R/O dissection    BNP-511  Trop-0.195  Creat-1.36  Other labs are unremarkable     BP-126/60  HR-85  SpO2-98% on RA  T-Afeb  MS: A/O x4    No medications administered    CTA Chest (-)

## 2023-01-13 NOTE — Progress Notes
Date of Service: 01/13/2023    Robert Mercado is a 77 y.o. male.       HPI      Robert Mercado is a 77 y.o.  white male with symptoms of acute on chronic HFrEF, recovered EF, ischemic cardiomyopathy, PAF on chronic anticoagulation, history of acute on chronic renal failure, chronic bilateral lower extremity edema, history of hyponatremia, chronic anemia and thrombocytopenia, amiodarone allergy (while patient was admitted at [facility] in September 2023 he did receive amiodarone IV for rate/rhythm control in the setting of atrial fibrillation and he developed an extensive, morbilliform rash to subsequently resolved), recent history of chest pain and admission at [facility] in [place], [place] between 01/06/2023 - 01/10/2023.  Patient was admitted with symptoms of chest pain, troponin were noted to be elevated.  He did undergo a left heart catheterization, patient was found to have a 90% stenosis of the ramus intermedius branch, PCI was attempted, however due to access issues he did not undergo stent placement.    Patient is today in our office for a follow-up office visit.  He continues to have chest pain that radiates from the anterior to posterior chest, it occurs at rest and with minimal physical activity.  He appears to be pale and diaphoretic.  He is also tachycardic, rhythm is atrial fibrillation with a ventricular rate of 111 bpm.         Vitals:    01/13/23 1321   BP: 122/64   BP Source: Arm, Left Upper   Pulse: (!) 127   SpO2: 98%   O2 Device: None (Room air)   PainSc: Zero   Weight: 125 kg (275 lb 9.6 oz)   Height: 182.9 cm (6')     Body mass index is 37.38 kg/m?Marland Kitchen     Past Medical History  Patient Active Problem List    Diagnosis Date Noted    Medication side effects 08/11/2022     While hospitalized in September 2023 patient was initiated on amiodarone for rate control in the setting of atrial fibrillation.  He developed an extensive, generalized micropapular rash thought to be due to amiodarone.      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    Chronic fatigue 04/01/2018    Tenosynovitis, de Quervain 10/21/2017    PVC (premature ventricular contraction) 12/31/2016    PVC's (premature ventricular contractions) 11/06/2016     0214/18 PVC ablation - x 3 morphologies RVOT       Preoperative cardiovascular examination 04/28/2016    Hospital discharge follow-up 03/10/2016    Chronic fatigue 08/21/2015    Hypotension due to drugs 08/21/2015    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 Mackey Birchwood: 30% instent restenosis of previously placed stent in the LAD, and patent 2nd DIAG  stent and 60% distal LAD disease. Medical management      Peripheral neuropathy 11/12/2009         Review of Systems   Constitutional: Positive for malaise/fatigue.   HENT: Negative.     Eyes: Negative.    Cardiovascular:  Positive for chest pain.   Respiratory:  Positive for shortness of breath.    Endocrine: Negative.    Hematologic/Lymphatic: Negative.    Skin: Negative.    Musculoskeletal:  Positive for muscle weakness.   Gastrointestinal: Negative.    Genitourinary: Negative.    Neurological:  Positive for weakness.   Psychiatric/Behavioral: Negative.     Allergic/Immunologic: Negative.        Physical Exam  General Appearance: Patient is pale, diaphoretic, he reports chest pain radiating anteriorly to posteriorly upper chest  Skin: warm, moist, no ulcers or xanthomas  Eyes: conjunctivae and lids normal, pupils are equal and round  Lips & Oral Mucosa: no pallor or cyanosis  Neck Veins: neck veins are flat, neck veins are not distended  Chest Inspection: chest is normal in appearance  Respiratory Effort: breathing comfortably, no respiratory distress  Auscultation/Percussion: lungs clear to auscultation, no rales or rhonchi, no wheezing  Cardiac Rhythm: irregularly irregular rhythm and normal rate  Cardiac Auscultation: S1, S2 normal, no rub, no gallop  Murmurs: no murmur  Carotid Arteries: normal carotid upstroke bilaterally, no bruit  Abdominal Aorta: no abdominal aortic bruit  Lower Extremity Edema: no lower extremity edema  Abdominal Exam: soft, non-tender, no masses, bowel sounds normal  Liver & Spleen: no organomegaly  Neurologic Exam: neurological assessment grossly intact    Cardiovascular Studies  Twelve-lead EKG demonstrates atrial fibrillation, ventricular rate 111 bpm, RBBB, LAD, LAFB, no acute ST segment T wave changes    Cardiovascular Health Factors  Vitals BP Readings from Last 3 Encounters:   01/13/23 122/64   11/11/22 112/78   08/12/22 (!) 154/84     Wt Readings from Last 3 Encounters:   01/13/23 125 kg (275 lb 9.6 oz)   11/11/22 134.9 kg (297 lb 6.4 oz)   08/12/22 130 kg (286 lb 9.6 oz)     BMI Readings from Last 3 Encounters:   01/13/23 37.38 kg/m?   11/11/22 40.33 kg/m?   08/12/22 38.87 kg/m?      Smoking Social History     Tobacco Use   Smoking Status Never   Smokeless Tobacco Never      Lipid Profile Cholesterol   Date Value Ref Range Status   07/29/2022 117 <200 MG/DL Final     HDL   Date Value Ref Range Status   07/29/2022 43 >40 MG/DL Final     LDL   Date Value Ref Range Status   07/29/2022 61 <100 mg/dL Final     Triglycerides   Date Value Ref Range Status   07/29/2022 74 <150 MG/DL Final      Blood Sugar No results found for: HGBA1C  Glucose   Date Value Ref Range Status   08/03/2022 91 70 - 100 MG/DL Final   19/14/7829 562 (H) 70 - 100 MG/DL Final   13/06/6577 469 (H) 70 - 100 MG/DL Final   62/95/2841 76 65 - 99 mg/dL Final     Comment:                   Fasting reference interval             Problems Addressed Today  Encounter Diagnoses   Name Primary?  Screening for heart disease Yes    Persistent atrial fibrillation (HCC)     Coronary artery disease involving native coronary artery of native heart without angina pectoris     Chronic combined systolic and diastolic heart failure (HCC)     Mixed hyperlipidemia     Hypotension due to drugs     LAFB (left anterior fascicular block)     PVC (premature ventricular contraction)     SSS (sick sinus syndrome) (HCC)     Cardiac pacemaker in situ     Chronic anticoagulation, on warfarin     Hospital discharge follow-up     OSA (obstructive sleep apnea)        Assessment and Plan         Assessment:    1.  Acute on chronic chest pain  Symptoms appear to be compatible with angina, patient describes the pain is radiating from anterior to posterior chest occurring at rest and with minimal physical activity  2.  Recent history of non-ST MI, elevated troponin and hospital admission at [facility] on 01/08/2023  3.  Status post LHC  It was attempted to open a 90% stenosis of the ramus in the medius, successful  4.  Tachycardia in the setting of persistent atrial fibrillation  The ventricular rate is 111 bpm, there are no acute ST segment/T wave changes  5.  Acute on chronic combined systolic and diastolic heart failure  In part this is due to underlying tachyarrhythmia  Patient was admitted at [facility] September 2023 and he underwent IV diuretics and continued on p.o. bumetanide upon discharge  6.  Coronary artery disease  PTCA of the LAD in April 2010  PTCA of diagonal branch in February 2013  Previous LHC in December 2021 in [place] -at that time findings were consistent with mid LAD lesion 55%, IFR did not indicate any hemodynamically significant stenosis, D1 lesion 70% stenosis, this is known from before and this is a small to medium size vessel, second diagonal 50% stenosis is a small vessel   7.  History of sick sinus syndrome-status post pacemaker implant in July 2011  8.  Status post PVC ablation in February 2018-no recurrent ventricular ectopy and now evidence of ventricular arrhythmias  9.  Mild aortic valve stenosis  A previous echocardiogram performed in September 2023 demonstrated MG = 13 mmHg  10.  OSA-using a CPAP machine  11.  History of COVID-19 viral infection-this occurred in 2023, patient did recover  12.  Amiodarone allergy  When patient was admitted at [facility] in September 2023 he developed a generalized, pruritic morbilliform rash, at that time he was administered amiodarone in the setting of atrial fibrillation for rhythm/rate control    Plan:    Due to ill appearance, diaphoresis and continued chest pain I refer the patient to emergency room department at [facility].  Further care will be directed after initial evaluation in the emergency room department.    Total Time Today was 40 minutes in the following activities: Preparing to see the patient, Obtaining and/or reviewing separately obtained history, Performing a medically appropriate examination and/or evaluation, Counseling and educating the patient/family/caregiver, Ordering medications, tests, or procedures, Referring and communication with other health care professionals (when not separately reported), Documenting clinical information in the electronic or other health record, and Independently interpreting results (not separately reported) and communicating results to the patient/family/caregiver          Current Medications (including today's revisions)   aspirin EC  81 mg tablet Take one tablet by mouth daily.    atorvastatin (LIPITOR) 20 mg tablet TAKE 1 TABLET EVERY DAY    bumetanide (BUMEX) 2 mg tablet Take one tablet by mouth daily. Indications: accumulation of fluid resulting from chronic heart failure    coenzyme Q10 200 mg capsule Take one capsule by mouth at bedtime daily.    dilTIAZem CD (CARDIZEM CD) 180 mg capsule Take one capsule by mouth twice daily. Indications: paroxysmal supraventricular tachycardia    diphenhydrAMINE HCL (BENADRYL) 25 mg capsule Take one capsule by mouth every 6 hours as needed. Indications: hives    FEROSUL 325 mg (65 mg iron) tablet Take 65 mg by mouth three times weekly.    finasteride (PROSCAR) 5 mg tablet Take one tablet by mouth at bedtime daily.    fluticasone (FLONASE) 50 mcg/actuation nasal spray Apply two sprays to each nostril as directed as Needed. Shake bottle gently before using.    gabapentin (NEURONTIN) 300 mg capsule Take three capsules by mouth three times daily.    loperamide (IMODIUM A-D) 2 mg capsule Take by mouth as needed every 2 hours for diarrhea.  Do not take more than 8 tablets daily.  Indications: diarrhea    MELATONIN PO Take 10 mg by mouth at bedtime daily.    metoprolol succinate XL (TOPROL XL) 25 mg extended release tablet Take one tablet by mouth twice daily. Indications: high blood pressure    multivitamin (ONE-A-DAY) tablet Take one tablet by mouth daily.    nitroglycerin (NITROSTAT) 0.4 mg tablet Take one tablet sublingually as needed every 5 min for chest pain X three    pantoprazole DR (PROTONIX) 20 mg tablet Take one tablet by mouth twice daily.    potassium chloride SR (KLOR-CON M20) 20 mEq tablet Take two tablets by mouth daily.    vit A/vit C/vit E/zinc/copper (PRESERVISION AREDS PO) Take 1 tablet by mouth twice daily.    warfarin (COUMADIN) 1 mg tablet Take 1-2 tablets by mouth in combination with 4 mg tablets as directed by PCP    warfarin (COUMADIN) 4 mg tablet Take one tablet by mouth daily. **DO NOT TAKE THIS UNTIL INDICATED BASED ON INR RESULTS**

## 2023-01-14 ENCOUNTER — Encounter: Admit: 2023-01-14 | Discharge: 2023-01-14 | Payer: MEDICARE

## 2023-01-16 ENCOUNTER — Encounter: Admit: 2023-01-16 | Discharge: 2023-01-16 | Payer: MEDICARE

## 2023-01-20 ENCOUNTER — Encounter: Admit: 2023-01-20 | Discharge: 2023-01-20 | Payer: MEDICARE

## 2023-01-21 ENCOUNTER — Encounter: Admit: 2023-01-21 | Discharge: 2023-01-21 | Payer: MEDICARE

## 2023-01-22 ENCOUNTER — Encounter: Admit: 2023-01-22 | Discharge: 2023-01-22 | Payer: MEDICARE

## 2023-01-22 ENCOUNTER — Ambulatory Visit: Admit: 2023-01-22 | Discharge: 2023-01-23 | Payer: MEDICARE

## 2023-01-22 DIAGNOSIS — R001 Bradycardia, unspecified: Secondary | ICD-10-CM

## 2023-01-22 DIAGNOSIS — I499 Cardiac arrhythmia, unspecified: Secondary | ICD-10-CM

## 2023-01-22 DIAGNOSIS — I480 Paroxysmal atrial fibrillation: Secondary | ICD-10-CM

## 2023-01-22 DIAGNOSIS — I4891 Unspecified atrial fibrillation: Secondary | ICD-10-CM

## 2023-01-22 DIAGNOSIS — I2510 Atherosclerotic heart disease of native coronary artery without angina pectoris: Secondary | ICD-10-CM

## 2023-01-22 DIAGNOSIS — E669 Obesity, unspecified: Secondary | ICD-10-CM

## 2023-01-22 DIAGNOSIS — G629 Polyneuropathy, unspecified: Secondary | ICD-10-CM

## 2023-01-22 DIAGNOSIS — Z7901 Long term (current) use of anticoagulants: Secondary | ICD-10-CM

## 2023-01-22 DIAGNOSIS — I4892 Unspecified atrial flutter: Secondary | ICD-10-CM

## 2023-01-22 DIAGNOSIS — E785 Hyperlipidemia, unspecified: Secondary | ICD-10-CM

## 2023-01-22 DIAGNOSIS — R0989 Other specified symptoms and signs involving the circulatory and respiratory systems: Secondary | ICD-10-CM

## 2023-01-22 DIAGNOSIS — G4730 Sleep apnea, unspecified: Secondary | ICD-10-CM

## 2023-01-22 DIAGNOSIS — Z950 Presence of cardiac pacemaker: Secondary | ICD-10-CM

## 2023-01-22 DIAGNOSIS — I495 Sick sinus syndrome: Secondary | ICD-10-CM

## 2023-01-22 NOTE — Progress Notes
Date of Service: 01/22/2023    Robert Mercado is a 77 y.o. male.       HPI      Robert Mercado is a 77 y.o.  white male with symptoms of acute on chronic HFrEF, recovered EF, ischemic cardiomyopathy, PAF on chronic anticoagulation, history of acute on chronic renal failure, chronic bilateral lower extremity edema, history of hyponatremia, chronic anemia and thrombocytopenia, amiodarone allergy (while patient was admitted at [facility] in September 2023 he did receive amiodarone IV for rate/rhythm control in the setting of atrial fibrillation and he developed an extensive, morbilliform rash that subsequently resolved), recent history of chest pain and admission at [facility] in [place], [place] between 01/06/2023 - 01/10/2023.  Patient was admitted with symptoms of chest pain, troponin were noted to be elevated.  He did undergo a left heart catheterization, patient was found to have a 90% stenosis of the ramus intermedius branch, PCI was attempted, however due to access issues he did not undergo stent placement.     Patient was last seen in the office on 01/13/2023.  At that time he appeared ill, he was diaphoretic, he reported having chest pain.    From the office he was redirected to the emergency room department, the cardiac markers were slightly elevated, also a chest CT did not demonstrate any aortic dissection.  Patient was further referred to [facility] in [place], [place], he was readmitted between 2/27 and 01/16/2023.  Patient was seen in cardiac consult, he did undergo a left heart catheterization, he did receive PCI of the ramus intermedius.    He is back in the office today.  He does not report chest pain, he does report generalized weakness, dizziness or lightheadedness.  Blood pressure and heart rate are under good control.  On the physical exam he appeared to be very dry.  Patient remains on bumetanide 2 mg p.o. daily.            Vitals:    01/22/23 1016   BP: 122/74 Pulse: 83   SpO2: 99%   O2 Device: None (Room air)   PainSc: Zero   Weight: 124.7 kg (275 lb)   Height: 182.9 cm (6')     Body mass index is 37.3 kg/m?Marland Kitchen     Past Medical History  Patient Active Problem List    Diagnosis Date Noted    Medication side effects 08/11/2022     While hospitalized in September 2023 patient was initiated on amiodarone for rate control in the setting of atrial fibrillation.  He developed an extensive, generalized micropapular rash thought to be due to amiodarone.      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    Chronic fatigue 04/01/2018    Tenosynovitis, de Quervain 10/21/2017    PVC (premature ventricular contraction) 12/31/2016    PVC's (premature ventricular contractions) 11/06/2016     0214/18 PVC ablation - x 3 morphologies RVOT       Preoperative cardiovascular examination 04/28/2016    Hospital discharge follow-up 03/10/2016    Chronic fatigue 08/21/2015    Hypotension due to drugs 08/21/2015    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 Mackey Birchwood: 30% instent restenosis of previously placed stent in the LAD, and patent 2nd DIAG stent and 60% distal LAD disease. Medical management      Peripheral neuropathy 11/12/2009         Review of Systems   Constitutional: Negative.   HENT: Negative.     Eyes: Negative.    Cardiovascular: Negative.    Respiratory: Negative.     Endocrine: Negative. Hematologic/Lymphatic: Negative.    Skin: Negative.    Musculoskeletal: Negative.    Gastrointestinal: Negative.    Genitourinary: Negative.    Neurological: Negative.    Psychiatric/Behavioral: Negative.     Allergic/Immunologic: Negative.        Physical Exam    General Appearance: Patient appears ill, pale, weak  Skin: warm, dry,  no ulcers or xanthomas  Eyes: conjunctivae and lids normal, pupils are equal and round  Lips & Oral Mucosa: no pallor or cyanosis  Neck Veins: neck veins are flat, neck veins are not distended  Chest Inspection: chest is normal in appearance  Respiratory Effort: breathing comfortably, no respiratory distress  Auscultation/Percussion: lungs clear to auscultation, no rales or rhonchi, no wheezing  Cardiac Rhythm:  irregular regular rhythm  Cardiac Auscultation: S1, S2 normal, no rub, no gallop  Murmurs: no murmur  Carotid Arteries: normal carotid upstroke bilaterally, no bruit  Lower Extremity Edema: no lower extremity edema  Abdominal Exam: soft, non-tender, no masses, bowel sounds normal  Liver & Spleen: no organomegaly  Language and Memory: patient responsive and seems to comprehend information  Neurologic Exam: neurological assessment grossly intact    Cardiovascular Studies    Atrial lead EKG demonstrated atrial flutter, typical, ventricular rate 87 bpm, RBBB, LAD, LAFB    Cardiovascular Health Factors  Vitals BP Readings from Last 3 Encounters:   01/22/23 122/74   01/13/23 122/64   11/11/22 112/78     Wt Readings from Last 3 Encounters:   01/22/23 124.7 kg (275 lb)   01/13/23 125 kg (275 lb 9.6 oz)   11/11/22 134.9 kg (297 lb 6.4 oz)     BMI Readings from Last 3 Encounters:   01/22/23 37.30 kg/m?   01/13/23 37.38 kg/m?   11/11/22 40.33 kg/m?      Smoking Social History     Tobacco Use   Smoking Status Never   Smokeless Tobacco Never      Lipid Profile Cholesterol   Date Value Ref Range Status   07/29/2022 117 <200 MG/DL Final     HDL   Date Value Ref Range Status   07/29/2022 43 >40 MG/DL Final     LDL   Date Value Ref Range Status   07/29/2022 61 <100 mg/dL Final     Triglycerides   Date Value Ref Range Status   07/29/2022 74 <150 MG/DL Final      Blood Sugar No results found for: HGBA1C  Glucose   Date Value Ref Range Status   08/03/2022 91 70 - 100 MG/DL Final   16/08/9603 540 (H) 70 - 100 MG/DL Final   98/09/9146 829 (H) 70 - 100 MG/DL Final   56/21/3086 76 65 - 99 mg/dL Final     Comment:  Fasting reference interval             Problems Addressed Today  Encounter Diagnoses   Name Primary?    Cardiovascular symptoms Yes       Assessment and Plan     Assessment:    1.  Recent history of chest pain, non-ST MI  Patient was admitted at [facility] in [place], [place] on 01/06/2023  Status post LHC with attempt of opening the ramus intermedius, unsuccessful  2.  Recurrent chest pain  Patient was initially seen in the office on 01/13/2023  He was directed from the office to the emergency room department at [facility]  Patient was worked up with cardiac troponins and CTA of the chest, no aortic dissection was present  He was admitted at [facility] in [place], [place]  3.  Coronary artery disease  PTCA of the LAD in April 2010  PTCA of diagonal branch in February 2013  Previous LHC in December 2021 in [place] -at that time findings were consistent with mid LAD lesion 55%, IFR did not indicate any hemodynamically significant stenosis, D1 lesion 70% stenosis, this is known from before and this is a small to medium size vessel, second diagonal 50% stenosis is a small vessel   Status post LHC on 01/06/2023 with attempt of opening a 90% stenosis of the ramus intermedius, initially unsuccessful  Status post repeat LHC on 01/13/2023, status post PCI to ramus intermedius and currently on triple therapy including ASA, clopidogrel and warfarin  4.  Permanent atrial fibrillation  Rate is well-controlled, patient is on a vitamin K antagonist  5. Atrial flutter, typical-the twelve-lead EKG performed today in our office demonstrated typical atrial flutter  6.  Abnormal EKG, evidence of conduction abnormalities consisting of RBB, LAD and LAFB  7.  History of sick sinus syndrome, status post PPM implant in July 2011  8.  Status post PVC ablation in February 2018  No recurrent ventricular ectopy or ventricular arrhythmia  9. Mild aortic valve stenosis  A previous echocardiogram performed in September 2023 demonstrated MG = 13 mmHg  10.  OSA-using a CPAP machine  11.  History of COVID-19 viral infection-this occurred in 2023, patient did recover  12.  Amiodarone allergy  When patient was admitted at [facility] in September 2023 he developed a generalized, pruritic morbilliform rash, at that time he was administered amiodarone in the setting of atrial fibrillation for rhythm/rate control    Plan:    1.  I advised the patient to undergo physical therapy or perhaps admission to inpatient rehab  2.  Continue with triple therapy for now including ASA 81 mg p.o. daily, clopidogrel 75 mg p.o. daily and warfarin with a goal INR between 2 and 3  3.  Electrophysiology evaluation to discuss atrial flutter ablation  4.  Follow-up office visit with me in approximately 3 to 4 months      Total Time Today was 40 minutes in the following activities: Preparing to see the patient, Obtaining and/or reviewing separately obtained history, Performing a medically appropriate examination and/or evaluation, Counseling and educating the patient/family/caregiver, Ordering medications, tests, or procedures, Referring and communication with other health care professionals (when not separately reported), Documenting clinical information in the electronic or other health record, Independently interpreting results (not separately reported) and communicating results to the patient/family/caregiver, and Care coordination (not separately reported)                Current Medications (including  today's revisions)   aspirin EC 81 mg tablet Take one tablet by mouth daily.    atorvastatin (LIPITOR) 20 mg tablet TAKE 1 TABLET EVERY DAY (Patient taking differently: Take four tablets by mouth daily.)    bumetanide (BUMEX) 2 mg tablet Take one tablet by mouth daily. Indications: accumulation of fluid resulting from chronic heart failure    clopiDOGreL (PLAVIX) 75 mg tablet Take one tablet by mouth daily.    coenzyme Q10 200 mg capsule Take one capsule by mouth at bedtime daily.    dilTIAZem CD (CARDIZEM CD) 180 mg capsule Take one capsule by mouth twice daily. Indications: paroxysmal supraventricular tachycardia    diphenhydrAMINE HCL (BENADRYL) 25 mg capsule Take one capsule by mouth every 6 hours as needed. Indications: hives    FEROSUL 325 mg (65 mg iron) tablet Take 65 mg by mouth three times weekly.    finasteride (PROSCAR) 5 mg tablet Take one tablet by mouth at bedtime daily.    fluticasone (FLONASE) 50 mcg/actuation nasal spray Apply two sprays to each nostril as directed as Needed. Shake bottle gently before using.    gabapentin (NEURONTIN) 300 mg capsule Take three capsules by mouth three times daily.    loperamide (IMODIUM A-D) 2 mg capsule Take by mouth as needed every 2 hours for diarrhea.  Do not take more than 8 tablets daily.  Indications: diarrhea    MELATONIN PO Take 10 mg by mouth at bedtime daily.    metoprolol succinate XL (TOPROL XL) 25 mg extended release tablet Take one tablet by mouth twice daily. Indications: high blood pressure    multivitamin (ONE-A-DAY) tablet Take one tablet by mouth daily.    nitroglycerin (NITROSTAT) 0.4 mg tablet Take one tablet sublingually as needed every 5 min for chest pain X three    Pantoprazole (PROTONIX) 40 mg granules Take forty mg by mouth twice daily.    potassium chloride SR (KLOR-CON M20) 20 mEq tablet Take two tablets by mouth daily.    vit A/vit C/vit E/zinc/copper (PRESERVISION AREDS PO) Take 1 tablet by mouth twice daily.    warfarin (COUMADIN) 1 mg tablet Take 1-2 tablets by mouth in combination with 4 mg tablets as directed by PCP    warfarin (COUMADIN) 4 mg tablet Take one tablet by mouth daily. **DO NOT TAKE THIS UNTIL INDICATED BASED ON INR RESULTS**

## 2023-01-22 NOTE — Patient Instructions
Thank you for visiting our office today.        We will be pursuing the following tests after your appointment today:    Cardiac rehab  EP cardiologist in [place]       Please call us in the meantime with any questions or concerns.        Please allow 5-7 business days for our providers to review your results. All normal results will go to MyChart. If you do not have Mychart, it is strongly recommended to get this so you can easily view all your results. If you do not have mychart, we will attempt to call you once with normal lab and testing results. If we cannot reach you by phone with normal results, we will send you a letter.  If you have not heard the results of your testing after one week please give [place] a call.       Your Cardiovascular Medicine [place] Team Richardson Landry, Rene Kocher and [place])  phone number is [phone].

## 2023-01-22 NOTE — Telephone Encounter
Pt called and asked if he could see [facility] earlier than as scheduled.  He was sent to the ED when he last saw her, and was admitted to [facility] in [place] and underwent cardiac cath with stent placed on 2/29.  He was discharged feeling pretty good, but then on 3/3, he started having chest pain again and went to the ED.  His troponins were elevated at that time and he was readmitted to [facility] and told he had another cardiac event at that time.  He would like to see Dr. Virgina Organ to review his care plan and follow up after the recent hospitalizations.      [facility] has opening this morning, okay'd with clinic staff to schedule.  Pt confirmed appt time and location.  Will callback with any questions, concerns or problems.

## 2023-01-29 ENCOUNTER — Encounter: Admit: 2023-01-29 | Discharge: 2023-01-29 | Payer: MEDICARE

## 2023-02-04 ENCOUNTER — Encounter: Admit: 2023-02-04 | Discharge: 2023-02-04 | Payer: MEDICARE

## 2023-02-04 DIAGNOSIS — I4819 Other persistent atrial fibrillation: Secondary | ICD-10-CM

## 2023-02-04 DIAGNOSIS — Z950 Presence of cardiac pacemaker: Secondary | ICD-10-CM

## 2023-02-04 DIAGNOSIS — I480 Paroxysmal atrial fibrillation: Secondary | ICD-10-CM

## 2023-02-09 ENCOUNTER — Encounter: Admit: 2023-02-09 | Discharge: 2023-02-09 | Payer: MEDICARE

## 2023-02-09 NOTE — Telephone Encounter
Called patient and discussed dosing over the phone.

## 2023-02-17 ENCOUNTER — Encounter: Admit: 2023-02-17 | Discharge: 2023-02-17 | Payer: MEDICARE

## 2023-02-17 DIAGNOSIS — I4819 Other persistent atrial fibrillation: Secondary | ICD-10-CM

## 2023-02-17 DIAGNOSIS — I5042 Chronic combined systolic (congestive) and diastolic (congestive) heart failure: Secondary | ICD-10-CM

## 2023-02-17 DIAGNOSIS — R600 Localized edema: Secondary | ICD-10-CM

## 2023-02-17 MED ORDER — BUMETANIDE 2 MG PO TAB
2 mg | ORAL_TABLET | Freq: Two times a day (BID) | ORAL | 3 refills | Status: AC
Start: 2023-02-17 — End: ?

## 2023-02-17 NOTE — Telephone Encounter
Received call from [place], [place] [phone]) with concerns about increased edema in patient's bilat feet. Amil Amen reports patient has 4+ pitting edema in feet up into lower legs. This is her first visit with this patient so she does not have a comparison but the patient and wife tell her it is worsening. Patient denies any other congestion symptoms at this time. Vital signs stable. Patient currently on bumex 2 mg daily.    Discussed with [facility] via phone. She recommends for patient to increase bumex 2 mg daily to 2 mg twice daily. He is to have labs now and in 7-10 days after increasing medication.

## 2023-02-19 ENCOUNTER — Encounter: Admit: 2023-02-19 | Discharge: 2023-02-19 | Payer: MEDICARE

## 2023-02-20 ENCOUNTER — Encounter: Admit: 2023-02-20 | Discharge: 2023-02-20 | Payer: MEDICARE

## 2023-02-20 DIAGNOSIS — R600 Localized edema: Secondary | ICD-10-CM

## 2023-02-20 DIAGNOSIS — I5042 Chronic combined systolic (congestive) and diastolic (congestive) heart failure: Secondary | ICD-10-CM

## 2023-02-20 DIAGNOSIS — I4819 Other persistent atrial fibrillation: Secondary | ICD-10-CM

## 2023-02-20 LAB — BASIC METABOLIC PANEL
ANION GAP: 9.7
BLD UREA NITROGEN: 31 % — ABNORMAL HIGH (ref 6–20)
CALCIUM: 8.4 K/UL (ref 0–0.80)
CHLORIDE: 109 % (ref 4–12)
CO2: 26 % (ref 0–5)
CREATININE: 1.4 K/UL — ABNORMAL HIGH (ref 0.60–1.20)
GFR ESTIMATED: 50 K/UL — ABNORMAL LOW (ref >59)
GLUCOSE,PANEL: 130 K/UL — ABNORMAL HIGH (ref 74–106)
POTASSIUM: 3.7 % — ABNORMAL LOW (ref 24–44)
SODIUM: 141 % (ref 41–77)

## 2023-02-20 LAB — BNP (B-TYPE NATRIURETIC PEPTI): BNP: 130 — ABNORMAL HIGH (ref <=100)

## 2023-02-23 ENCOUNTER — Encounter: Admit: 2023-02-23 | Discharge: 2023-02-23 | Payer: MEDICARE

## 2023-02-26 ENCOUNTER — Encounter: Admit: 2023-02-26 | Discharge: 2023-02-26 | Payer: MEDICARE

## 2023-03-05 ENCOUNTER — Encounter: Admit: 2023-03-05 | Discharge: 2023-03-05 | Payer: MEDICARE

## 2023-03-06 ENCOUNTER — Encounter: Admit: 2023-03-06 | Discharge: 2023-03-06 | Payer: MEDICARE

## 2023-03-06 DIAGNOSIS — Z95810 Presence of automatic (implantable) cardiac defibrillator: Secondary | ICD-10-CM

## 2023-03-06 NOTE — Progress Notes
Called and spoke w/steve and irene, notified that ppm check is cancelled as it was a duplicate order (same expected date as last in office check)  orders updated and Brett Canales and Karena Addison agreed to the plan.

## 2023-03-08 ENCOUNTER — Encounter: Admit: 2023-03-08 | Discharge: 2023-03-08 | Payer: MEDICARE

## 2023-03-09 ENCOUNTER — Encounter: Admit: 2023-03-09 | Discharge: 2023-03-09 | Payer: MEDICARE

## 2023-03-09 DIAGNOSIS — I4819 Other persistent atrial fibrillation: Secondary | ICD-10-CM

## 2023-03-12 ENCOUNTER — Encounter: Admit: 2023-03-12 | Discharge: 2023-03-12 | Payer: MEDICARE

## 2023-03-18 ENCOUNTER — Encounter: Admit: 2023-03-18 | Discharge: 2023-03-18 | Payer: MEDICARE

## 2023-03-18 DIAGNOSIS — I480 Paroxysmal atrial fibrillation: Secondary | ICD-10-CM

## 2023-03-18 DIAGNOSIS — Z7901 Long term (current) use of anticoagulants: Secondary | ICD-10-CM

## 2023-03-18 LAB — PROTIME INR (PT): INR: 2.8

## 2023-03-18 NOTE — Progress Notes
03/18/2023 7:56 AM     INR 2.8    Your INR results were reviewed and are within a therapeutic range.  Please continue current coumadin dosing of 2 mg on Sunday, Tuesday/Thursday and 4 mg on all other days.   Please plan to recheck INR on 03/24/23.

## 2023-03-23 ENCOUNTER — Encounter: Admit: 2023-03-23 | Discharge: 2023-03-23 | Payer: MEDICARE

## 2023-03-24 ENCOUNTER — Encounter: Admit: 2023-03-24 | Discharge: 2023-03-24 | Payer: MEDICARE

## 2023-03-24 DIAGNOSIS — I480 Paroxysmal atrial fibrillation: Secondary | ICD-10-CM

## 2023-03-24 DIAGNOSIS — Z7901 Long term (current) use of anticoagulants: Secondary | ICD-10-CM

## 2023-03-29 ENCOUNTER — Encounter: Admit: 2023-03-29 | Discharge: 2023-03-29 | Payer: MEDICARE

## 2023-03-30 ENCOUNTER — Encounter: Admit: 2023-03-30 | Discharge: 2023-03-30 | Payer: MEDICARE

## 2023-03-30 DIAGNOSIS — I480 Paroxysmal atrial fibrillation: Secondary | ICD-10-CM

## 2023-03-30 DIAGNOSIS — Z7901 Long term (current) use of anticoagulants: Secondary | ICD-10-CM

## 2023-03-30 DIAGNOSIS — I4819 Other persistent atrial fibrillation: Secondary | ICD-10-CM

## 2023-03-30 MED ORDER — CLOPIDOGREL 75 MG PO TAB
75 mg | ORAL_TABLET | Freq: Every day | ORAL | 3 refills | 90.00000 days | Status: AC
Start: 2023-03-30 — End: ?

## 2023-04-07 ENCOUNTER — Encounter: Admit: 2023-04-07 | Discharge: 2023-04-07 | Payer: MEDICARE

## 2023-04-07 DIAGNOSIS — Z7901 Long term (current) use of anticoagulants: Secondary | ICD-10-CM

## 2023-04-07 DIAGNOSIS — I480 Paroxysmal atrial fibrillation: Secondary | ICD-10-CM

## 2023-04-07 DIAGNOSIS — I4819 Other persistent atrial fibrillation: Secondary | ICD-10-CM

## 2023-04-13 ENCOUNTER — Encounter: Admit: 2023-04-13 | Discharge: 2023-04-13 | Payer: MEDICARE

## 2023-04-13 DIAGNOSIS — Z7901 Long term (current) use of anticoagulants: Secondary | ICD-10-CM

## 2023-04-13 DIAGNOSIS — I480 Paroxysmal atrial fibrillation: Secondary | ICD-10-CM

## 2023-04-13 DIAGNOSIS — I4819 Other persistent atrial fibrillation: Secondary | ICD-10-CM

## 2023-04-13 LAB — PROTIME INR (PT): INR: 2.1 FL (ref 7–11)

## 2023-04-22 ENCOUNTER — Encounter: Admit: 2023-04-22 | Discharge: 2023-04-22 | Payer: MEDICARE

## 2023-04-22 DIAGNOSIS — I4819 Other persistent atrial fibrillation: Secondary | ICD-10-CM

## 2023-04-23 ENCOUNTER — Encounter: Admit: 2023-04-23 | Discharge: 2023-04-23 | Payer: MEDICARE

## 2023-04-23 MED ORDER — ATORVASTATIN 80 MG PO TAB
80 mg | ORAL_TABLET | Freq: Every day | ORAL | 3 refills | Status: AC
Start: 2023-04-23 — End: ?

## 2023-04-27 ENCOUNTER — Encounter: Admit: 2023-04-27 | Discharge: 2023-04-27 | Payer: MEDICARE

## 2023-04-27 DIAGNOSIS — I480 Paroxysmal atrial fibrillation: Secondary | ICD-10-CM

## 2023-04-27 DIAGNOSIS — Z7901 Long term (current) use of anticoagulants: Secondary | ICD-10-CM

## 2023-04-27 NOTE — Progress Notes
04/27/2023 8:26 AM     INR 2.0    Your INR results were reviewed and are within a therapeutic range.  Please continue current coumadin dosing of 4 mg on Sun/Tues/Thur and 2 mg on all other days.   Please plan to recheck INR on 05/04/23.

## 2023-04-29 ENCOUNTER — Encounter: Admit: 2023-04-29 | Discharge: 2023-04-29 | Payer: MEDICARE

## 2023-04-30 ENCOUNTER — Encounter: Admit: 2023-04-30 | Discharge: 2023-04-30 | Payer: MEDICARE

## 2023-04-30 DIAGNOSIS — I480 Paroxysmal atrial fibrillation: Secondary | ICD-10-CM

## 2023-04-30 DIAGNOSIS — R5382 Chronic fatigue, unspecified: Secondary | ICD-10-CM

## 2023-04-30 DIAGNOSIS — G4733 Obstructive sleep apnea (adult) (pediatric): Secondary | ICD-10-CM

## 2023-04-30 DIAGNOSIS — Z7901 Long term (current) use of anticoagulants: Secondary | ICD-10-CM

## 2023-04-30 DIAGNOSIS — T887XXA Unspecified adverse effect of drug or medicament, initial encounter: Secondary | ICD-10-CM

## 2023-04-30 DIAGNOSIS — G609 Hereditary and idiopathic neuropathy, unspecified: Secondary | ICD-10-CM

## 2023-04-30 DIAGNOSIS — Z950 Presence of cardiac pacemaker: Secondary | ICD-10-CM

## 2023-04-30 DIAGNOSIS — U071 COVID-19 virus infection: Secondary | ICD-10-CM

## 2023-04-30 DIAGNOSIS — E669 Obesity, unspecified: Secondary | ICD-10-CM

## 2023-04-30 DIAGNOSIS — I493 Ventricular premature depolarization: Secondary | ICD-10-CM

## 2023-04-30 DIAGNOSIS — I444 Left anterior fascicular block: Secondary | ICD-10-CM

## 2023-04-30 DIAGNOSIS — I2510 Atherosclerotic heart disease of native coronary artery without angina pectoris: Secondary | ICD-10-CM

## 2023-04-30 DIAGNOSIS — E782 Mixed hyperlipidemia: Secondary | ICD-10-CM

## 2023-04-30 DIAGNOSIS — E785 Hyperlipidemia, unspecified: Secondary | ICD-10-CM

## 2023-04-30 DIAGNOSIS — Z6841 Body Mass Index (BMI) 40.0 and over, adult: Secondary | ICD-10-CM

## 2023-04-30 DIAGNOSIS — I5043 Acute on chronic combined systolic (congestive) and diastolic (congestive) heart failure: Secondary | ICD-10-CM

## 2023-04-30 DIAGNOSIS — I4891 Unspecified atrial fibrillation: Secondary | ICD-10-CM

## 2023-04-30 DIAGNOSIS — I495 Sick sinus syndrome: Secondary | ICD-10-CM

## 2023-04-30 DIAGNOSIS — I499 Cardiac arrhythmia, unspecified: Secondary | ICD-10-CM

## 2023-04-30 DIAGNOSIS — I5042 Chronic combined systolic (congestive) and diastolic (congestive) heart failure: Secondary | ICD-10-CM

## 2023-04-30 DIAGNOSIS — R001 Bradycardia, unspecified: Secondary | ICD-10-CM

## 2023-04-30 DIAGNOSIS — R0989 Other specified symptoms and signs involving the circulatory and respiratory systems: Secondary | ICD-10-CM

## 2023-04-30 DIAGNOSIS — G629 Polyneuropathy, unspecified: Secondary | ICD-10-CM

## 2023-04-30 DIAGNOSIS — I952 Hypotension due to drugs: Secondary | ICD-10-CM

## 2023-04-30 DIAGNOSIS — I4819 Other persistent atrial fibrillation: Secondary | ICD-10-CM

## 2023-04-30 DIAGNOSIS — G4730 Sleep apnea, unspecified: Secondary | ICD-10-CM

## 2023-05-01 ENCOUNTER — Encounter: Admit: 2023-05-01 | Discharge: 2023-05-01 | Payer: MEDICARE

## 2023-05-01 NOTE — Telephone Encounter
-----   Message from Selinda Flavin, MD sent at 04/30/2023  4:11 PM CDT -----  Dear [name],    Please call the patient and ask him to stop the aspirin 81 mg, continue on warfarin and clopidogrel for now.    In February 2025 it will be 1 year anniversary since he received a stent in the ramus intermedius.  Will then take him off clopidogrel and resume aspirin along with warfarin.    He is on triple therapy right now and does not need to be on that, there is a risk of bleeding.    Thank you

## 2023-05-01 NOTE — Telephone Encounter
Discussed recommended medication changes with patient. Patient verbalizes understanding and will discontinue use of daily aspirin until February 2025 when he will discontinue plavix and restart aspirin. Told patient we would call him in February to remind him of medication changes.

## 2023-05-04 ENCOUNTER — Encounter: Admit: 2023-05-04 | Discharge: 2023-05-04 | Payer: MEDICARE

## 2023-05-04 DIAGNOSIS — I4819 Other persistent atrial fibrillation: Secondary | ICD-10-CM

## 2023-05-04 DIAGNOSIS — Z7901 Long term (current) use of anticoagulants: Secondary | ICD-10-CM

## 2023-05-04 DIAGNOSIS — I480 Paroxysmal atrial fibrillation: Secondary | ICD-10-CM

## 2023-05-05 ENCOUNTER — Encounter: Admit: 2023-05-05 | Discharge: 2023-05-05 | Payer: MEDICARE

## 2023-05-12 ENCOUNTER — Encounter: Admit: 2023-05-12 | Discharge: 2023-05-12 | Payer: MEDICARE

## 2023-05-12 NOTE — Progress Notes
Marlane Mingle    Referral source: Dorris Fetch, MD   Specialty:  Cardiology  Date presented for navigation:  01/27/2023    Primary care physician: Dwaine Gale  Primary cardiologist: Dorris Fetch, MD     He was referred to the atrial fibrillation clinic for consideration of AFL ablation.   Initial diagnosis of atrial fibrillation (date): 1993    History of atrial fibrillation management  Rate control medications (past and present): diltiazem, metoprolol  controlled on current regimen  Required DCCV in the past: No  Rhythm control medications (past and present): amiodarone, sotalol, dronedarone  Prior ablation(s): No    Cardiovascular Studies   QTc 482  Ambulatory monitor:  PPM in situ  Echocardiogram: Date:  07/30/2022  LA Size  4.8 cm  (Range: 3.0 - 4.0)  LA Vol 67.5 mL  (Range: 18 - 58)  LA Vol Index 26.37 mL/m2  (Range: 16 - 34)  EF: 50-55%  Ischemic evaluation: Date: 01/14/2023 LHC

## 2023-05-13 ENCOUNTER — Encounter: Admit: 2023-05-13 | Discharge: 2023-05-13 | Payer: MEDICARE

## 2023-05-14 ENCOUNTER — Ambulatory Visit: Admit: 2023-05-14 | Discharge: 2023-05-14 | Payer: MEDICARE

## 2023-05-14 ENCOUNTER — Encounter: Admit: 2023-05-14 | Discharge: 2023-05-14 | Payer: MEDICARE

## 2023-05-14 DIAGNOSIS — G629 Polyneuropathy, unspecified: Secondary | ICD-10-CM

## 2023-05-14 DIAGNOSIS — I480 Paroxysmal atrial fibrillation: Secondary | ICD-10-CM

## 2023-05-14 DIAGNOSIS — I495 Sick sinus syndrome: Secondary | ICD-10-CM

## 2023-05-14 DIAGNOSIS — Z136 Encounter for screening for cardiovascular disorders: Secondary | ICD-10-CM

## 2023-05-14 DIAGNOSIS — Z950 Presence of cardiac pacemaker: Secondary | ICD-10-CM

## 2023-05-14 DIAGNOSIS — I4891 Unspecified atrial fibrillation: Secondary | ICD-10-CM

## 2023-05-14 DIAGNOSIS — I2510 Atherosclerotic heart disease of native coronary artery without angina pectoris: Secondary | ICD-10-CM

## 2023-05-14 DIAGNOSIS — Z7901 Long term (current) use of anticoagulants: Secondary | ICD-10-CM

## 2023-05-14 DIAGNOSIS — I499 Cardiac arrhythmia, unspecified: Secondary | ICD-10-CM

## 2023-05-14 DIAGNOSIS — E669 Obesity, unspecified: Secondary | ICD-10-CM

## 2023-05-14 DIAGNOSIS — G4730 Sleep apnea, unspecified: Secondary | ICD-10-CM

## 2023-05-14 DIAGNOSIS — E785 Hyperlipidemia, unspecified: Secondary | ICD-10-CM

## 2023-05-14 DIAGNOSIS — Z6841 Body Mass Index (BMI) 40.0 and over, adult: Secondary | ICD-10-CM

## 2023-05-14 DIAGNOSIS — G4733 Obstructive sleep apnea (adult) (pediatric): Secondary | ICD-10-CM

## 2023-05-14 DIAGNOSIS — R001 Bradycardia, unspecified: Secondary | ICD-10-CM

## 2023-05-14 DIAGNOSIS — I493 Ventricular premature depolarization: Secondary | ICD-10-CM

## 2023-05-14 DIAGNOSIS — I444 Left anterior fascicular block: Secondary | ICD-10-CM

## 2023-05-14 NOTE — Patient Instructions
Please schedule an appointment with Dr. Avie Arenas in 3-6 months.  If you would like to call us to make this appt, please call [phone].  The schedule is released approximately 4-5 months in advance.    We will price Multaq and Tikosyn.    Pending costs, we will call you with the next steps.   Please check weekly INRs.     My name is Will Gerri Spore, Charity fundraiser.    In order to provide you the best care possible we ask that you follow up as below:    Contacting our office:    -Business Hours: Monday-Friday, 8:00 am-4:30 pm (excluding Holidays).     -For medical questions or concerns, please send [place] a message through your MyChart account or call the [facility] nursing triage line at [phone]. Please leave a detailed message with your name, date of birth, and reason for your call.  If your message is received before 3:30pm, every effort will be made to call you back the same day.  Please allow time for [place] to review your chart prior to call back.     -For medication refills please start by contacting your pharmacy. You can also send [place] a prescription question through your MyChart or call the nurse triage line above.     -Should you have an immediate need of the weekend/nights and holidays, please call our on-call triage line at [phone].    -Our fax number is [phone].    Results & Testing Follow Up:    -Please allow 10-15 business days for the results of any testing to be reviewed. Please call our office if you have not heard from a nurse within this time frame.    -Should you choose to complete testing at an outside facility, please contact our office after completion of testing so that we can ensure that we have received results.    Lab and test results:  As a part of the CARES act, starting 02/16/2020, some results will be released to you via mychart immediately and automatically.  You may see results before your provider sees them; however, your provider will review all these results and then they, or one of their team, will notify you of result information and recommendations.   Critical results will be addressed immediately, but otherwise, please allow [place] time to get back with you prior to you reaching out to [place] for questions.  This will usually take about 72 hours for labs and 5-7 days for procedure test results.      -You will receive a survey in the upcoming week from [facility]. Your feedback is important to [place], and helps [place] continue to improve patient care and patient satisfaction.

## 2023-05-18 ENCOUNTER — Encounter: Admit: 2023-05-18 | Discharge: 2023-05-18 | Payer: MEDICARE

## 2023-05-18 DIAGNOSIS — I480 Paroxysmal atrial fibrillation: Secondary | ICD-10-CM

## 2023-05-18 DIAGNOSIS — Z7901 Long term (current) use of anticoagulants: Secondary | ICD-10-CM

## 2023-05-19 ENCOUNTER — Encounter: Admit: 2023-05-19 | Discharge: 2023-05-19 | Payer: MEDICARE

## 2023-05-21 ENCOUNTER — Encounter: Admit: 2023-05-21 | Discharge: 2023-05-21 | Payer: MEDICARE

## 2023-05-25 ENCOUNTER — Encounter: Admit: 2023-05-25 | Discharge: 2023-05-25 | Payer: MEDICARE

## 2023-05-25 DIAGNOSIS — I480 Paroxysmal atrial fibrillation: Secondary | ICD-10-CM

## 2023-05-25 DIAGNOSIS — Z7901 Long term (current) use of anticoagulants: Secondary | ICD-10-CM

## 2023-05-26 ENCOUNTER — Encounter: Admit: 2023-05-26 | Discharge: 2023-05-26 | Payer: MEDICARE

## 2023-05-27 ENCOUNTER — Ambulatory Visit: Admit: 2023-05-27 | Discharge: 2023-05-27 | Payer: MEDICARE

## 2023-05-27 ENCOUNTER — Encounter: Admit: 2023-05-27 | Discharge: 2023-05-27 | Payer: MEDICARE

## 2023-05-27 DIAGNOSIS — I495 Sick sinus syndrome: Secondary | ICD-10-CM

## 2023-05-27 DIAGNOSIS — I480 Paroxysmal atrial fibrillation: Secondary | ICD-10-CM

## 2023-05-27 DIAGNOSIS — Z950 Presence of cardiac pacemaker: Secondary | ICD-10-CM

## 2023-06-01 ENCOUNTER — Encounter: Admit: 2023-06-01 | Discharge: 2023-06-01 | Payer: MEDICARE

## 2023-06-01 DIAGNOSIS — I480 Paroxysmal atrial fibrillation: Secondary | ICD-10-CM

## 2023-06-01 DIAGNOSIS — Z7901 Long term (current) use of anticoagulants: Secondary | ICD-10-CM

## 2023-06-01 LAB — PROTIME INR (PT): INR: 2.1

## 2023-06-02 ENCOUNTER — Encounter: Admit: 2023-06-02 | Discharge: 2023-06-02 | Payer: MEDICARE

## 2023-06-02 MED ORDER — WARFARIN 1 MG PO TAB
ORAL_TABLET | ORAL | 3 refills | 90.00000 days | Status: AC
Start: 2023-06-02 — End: ?

## 2023-06-05 ENCOUNTER — Encounter: Admit: 2023-06-05 | Discharge: 2023-06-05 | Payer: MEDICARE

## 2023-06-08 ENCOUNTER — Encounter: Admit: 2023-06-08 | Discharge: 2023-06-08 | Payer: MEDICARE

## 2023-06-08 MED ORDER — ISOSORBIDE MONONITRATE 30 MG PO TB24
30 mg | ORAL_TABLET | Freq: Every morning | ORAL | 3 refills | 90.00000 days | Status: AC
Start: 2023-06-08 — End: ?

## 2023-06-08 NOTE — Telephone Encounter
Med list update.  [facility] approves continuing Imdur.

## 2023-06-09 ENCOUNTER — Encounter: Admit: 2023-06-09 | Discharge: 2023-06-09 | Payer: MEDICARE

## 2023-06-09 DIAGNOSIS — I480 Paroxysmal atrial fibrillation: Secondary | ICD-10-CM

## 2023-06-09 DIAGNOSIS — Z7901 Long term (current) use of anticoagulants: Secondary | ICD-10-CM

## 2023-06-09 LAB — PROTIME INR (PT): INR: 3 % (ref 4–12)

## 2023-06-12 ENCOUNTER — Encounter: Admit: 2023-06-12 | Discharge: 2023-06-12 | Payer: MEDICARE

## 2023-06-12 NOTE — Telephone Encounter
Pt called to discuss medications due to elevated by review medication list.

## 2023-06-15 ENCOUNTER — Encounter: Admit: 2023-06-15 | Discharge: 2023-06-15 | Payer: MEDICARE

## 2023-06-15 DIAGNOSIS — Z7901 Long term (current) use of anticoagulants: Secondary | ICD-10-CM

## 2023-06-15 DIAGNOSIS — I480 Paroxysmal atrial fibrillation: Secondary | ICD-10-CM

## 2023-06-15 MED ORDER — METOPROLOL SUCCINATE 25 MG PO TB24
25 mg | ORAL_TABLET | Freq: Two times a day (BID) | ORAL | 3 refills | 90.00000 days | Status: AC
Start: 2023-06-15 — End: ?

## 2023-06-29 ENCOUNTER — Encounter: Admit: 2023-06-29 | Discharge: 2023-06-29 | Payer: MEDICARE

## 2023-06-29 DIAGNOSIS — Z7901 Long term (current) use of anticoagulants: Secondary | ICD-10-CM

## 2023-06-29 DIAGNOSIS — I480 Paroxysmal atrial fibrillation: Secondary | ICD-10-CM

## 2023-06-29 DIAGNOSIS — I350 Nonrheumatic aortic (valve) stenosis: Secondary | ICD-10-CM

## 2023-06-29 LAB — PROTIME INR (PT): INR: 2.4 FL — ABNORMAL HIGH (ref 80–100)

## 2023-06-29 NOTE — Telephone Encounter
Sent message to team liberty for Dr. Radford Pax per MPE.

## 2023-06-29 NOTE — Telephone Encounter
-----   Message from Venita Lick, MD sent at 06/27/2023  2:10 AM CDT -----  Please let him know that his echocardiogram shows that his aortic valve stenosis is potentially worse than it has been.  It appears as though he may benefit from an evaluation in the valve clinic however Dr. Radford Pax is his primary cardiologist.  Please have her nurse follow-up with Dr. Radford Pax regarding this issue.  Thank you

## 2023-07-01 ENCOUNTER — Encounter: Admit: 2023-07-01 | Discharge: 2023-07-01 | Payer: MEDICARE

## 2023-07-06 ENCOUNTER — Encounter: Admit: 2023-07-06 | Discharge: 2023-07-06 | Payer: MEDICARE

## 2023-07-10 ENCOUNTER — Encounter: Admit: 2023-07-10 | Discharge: 2023-07-10 | Payer: MEDICARE

## 2023-07-13 NOTE — Telephone Encounter
Echo reviewed by Dr. Avie Arenas.  She would like pt to be evaluated in valve clinic.  Referral sent.  Pt notified via mychart of results and recommendations.

## 2023-07-14 ENCOUNTER — Encounter: Admit: 2023-07-14 | Discharge: 2023-07-14 | Payer: MEDICARE

## 2023-07-16 ENCOUNTER — Encounter: Admit: 2023-07-16 | Discharge: 2023-07-16 | Payer: MEDICARE

## 2023-07-16 MED ORDER — DILTIAZEM HCL 180 MG PO CP24
180 mg | ORAL_CAPSULE | Freq: Two times a day (BID) | ORAL | 3 refills | 90.00000 days | Status: AC
Start: 2023-07-16 — End: ?

## 2023-07-17 ENCOUNTER — Encounter: Admit: 2023-07-17 | Discharge: 2023-07-17 | Payer: MEDICARE

## 2023-07-17 DIAGNOSIS — Z7901 Long term (current) use of anticoagulants: Secondary | ICD-10-CM

## 2023-07-17 DIAGNOSIS — I495 Sick sinus syndrome: Secondary | ICD-10-CM

## 2023-07-17 DIAGNOSIS — Z950 Presence of cardiac pacemaker: Secondary | ICD-10-CM

## 2023-07-17 DIAGNOSIS — I4891 Unspecified atrial fibrillation: Secondary | ICD-10-CM

## 2023-07-17 DIAGNOSIS — G4730 Sleep apnea, unspecified: Secondary | ICD-10-CM

## 2023-07-17 DIAGNOSIS — C4491 Basal cell carcinoma of skin, unspecified: Secondary | ICD-10-CM

## 2023-07-17 DIAGNOSIS — I480 Paroxysmal atrial fibrillation: Secondary | ICD-10-CM

## 2023-07-17 DIAGNOSIS — I2510 Atherosclerotic heart disease of native coronary artery without angina pectoris: Secondary | ICD-10-CM

## 2023-07-17 DIAGNOSIS — G629 Polyneuropathy, unspecified: Secondary | ICD-10-CM

## 2023-07-17 DIAGNOSIS — E669 Obesity, unspecified: Secondary | ICD-10-CM

## 2023-07-17 DIAGNOSIS — I350 Nonrheumatic aortic (valve) stenosis: Secondary | ICD-10-CM

## 2023-07-17 DIAGNOSIS — R001 Bradycardia, unspecified: Secondary | ICD-10-CM

## 2023-07-17 DIAGNOSIS — I499 Cardiac arrhythmia, unspecified: Secondary | ICD-10-CM

## 2023-07-17 DIAGNOSIS — Z01810 Encounter for preprocedural cardiovascular examination: Secondary | ICD-10-CM

## 2023-07-17 DIAGNOSIS — E785 Hyperlipidemia, unspecified: Secondary | ICD-10-CM

## 2023-07-22 ENCOUNTER — Encounter: Admit: 2023-07-22 | Discharge: 2023-07-22 | Payer: MEDICARE

## 2023-07-23 ENCOUNTER — Encounter: Admit: 2023-07-23 | Discharge: 2023-07-23 | Payer: MEDICARE

## 2023-07-23 DIAGNOSIS — R001 Bradycardia, unspecified: Secondary | ICD-10-CM

## 2023-07-23 DIAGNOSIS — I2510 Atherosclerotic heart disease of native coronary artery without angina pectoris: Secondary | ICD-10-CM

## 2023-07-23 DIAGNOSIS — I4891 Unspecified atrial fibrillation: Secondary | ICD-10-CM

## 2023-07-23 DIAGNOSIS — I5042 Chronic combined systolic (congestive) and diastolic (congestive) heart failure: Secondary | ICD-10-CM

## 2023-07-23 DIAGNOSIS — R0989 Other specified symptoms and signs involving the circulatory and respiratory systems: Secondary | ICD-10-CM

## 2023-07-23 DIAGNOSIS — C4491 Basal cell carcinoma of skin, unspecified: Secondary | ICD-10-CM

## 2023-07-23 DIAGNOSIS — I480 Paroxysmal atrial fibrillation: Secondary | ICD-10-CM

## 2023-07-23 DIAGNOSIS — G629 Polyneuropathy, unspecified: Secondary | ICD-10-CM

## 2023-07-23 DIAGNOSIS — Z950 Presence of cardiac pacemaker: Secondary | ICD-10-CM

## 2023-07-23 DIAGNOSIS — I493 Ventricular premature depolarization: Secondary | ICD-10-CM

## 2023-07-23 DIAGNOSIS — E669 Obesity, unspecified: Secondary | ICD-10-CM

## 2023-07-23 DIAGNOSIS — Z7901 Long term (current) use of anticoagulants: Secondary | ICD-10-CM

## 2023-07-23 DIAGNOSIS — I499 Cardiac arrhythmia, unspecified: Secondary | ICD-10-CM

## 2023-07-23 DIAGNOSIS — I495 Sick sinus syndrome: Secondary | ICD-10-CM

## 2023-07-23 DIAGNOSIS — I444 Left anterior fascicular block: Secondary | ICD-10-CM

## 2023-07-23 DIAGNOSIS — I952 Hypotension due to drugs: Secondary | ICD-10-CM

## 2023-07-23 DIAGNOSIS — E782 Mixed hyperlipidemia: Secondary | ICD-10-CM

## 2023-07-23 DIAGNOSIS — G4730 Sleep apnea, unspecified: Secondary | ICD-10-CM

## 2023-07-23 DIAGNOSIS — I5043 Acute on chronic combined systolic (congestive) and diastolic (congestive) heart failure: Secondary | ICD-10-CM

## 2023-07-23 DIAGNOSIS — E785 Hyperlipidemia, unspecified: Secondary | ICD-10-CM

## 2023-07-23 NOTE — Patient Instructions
Send Remote    Follow up in 3 months    Follow up as directed.  Call sooner if issues.  Call the [place] nursing line at [phone].  Leave a detailed message for the nurse in Robert Mercado with how we can assist you and we will call you back.

## 2023-07-24 ENCOUNTER — Encounter: Admit: 2023-07-24 | Discharge: 2023-07-24 | Payer: MEDICARE

## 2023-07-24 DIAGNOSIS — Z7901 Long term (current) use of anticoagulants: Secondary | ICD-10-CM

## 2023-07-24 DIAGNOSIS — I480 Paroxysmal atrial fibrillation: Secondary | ICD-10-CM

## 2023-07-24 LAB — PROTIME INR (PT): INR: 2.6

## 2023-07-28 ENCOUNTER — Encounter: Admit: 2023-07-28 | Discharge: 2023-07-28 | Payer: MEDICARE

## 2023-07-28 ENCOUNTER — Ambulatory Visit: Admit: 2023-07-28 | Discharge: 2023-07-28 | Payer: MEDICARE

## 2023-07-28 DIAGNOSIS — I5042 Chronic combined systolic (congestive) and diastolic (congestive) heart failure: Secondary | ICD-10-CM

## 2023-07-28 DIAGNOSIS — E669 Obesity, unspecified: Secondary | ICD-10-CM

## 2023-07-28 DIAGNOSIS — N2889 Other specified disorders of kidney and ureter: Secondary | ICD-10-CM

## 2023-07-28 DIAGNOSIS — G4730 Sleep apnea, unspecified: Secondary | ICD-10-CM

## 2023-07-28 DIAGNOSIS — I2510 Atherosclerotic heart disease of native coronary artery without angina pectoris: Secondary | ICD-10-CM

## 2023-07-28 DIAGNOSIS — I350 Nonrheumatic aortic (valve) stenosis: Secondary | ICD-10-CM

## 2023-07-28 DIAGNOSIS — I495 Sick sinus syndrome: Secondary | ICD-10-CM

## 2023-07-28 DIAGNOSIS — I499 Cardiac arrhythmia, unspecified: Secondary | ICD-10-CM

## 2023-07-28 DIAGNOSIS — I480 Paroxysmal atrial fibrillation: Secondary | ICD-10-CM

## 2023-07-28 DIAGNOSIS — Z950 Presence of cardiac pacemaker: Secondary | ICD-10-CM

## 2023-07-28 DIAGNOSIS — R600 Localized edema: Secondary | ICD-10-CM

## 2023-07-28 DIAGNOSIS — C4491 Basal cell carcinoma of skin, unspecified: Secondary | ICD-10-CM

## 2023-07-28 DIAGNOSIS — R001 Bradycardia, unspecified: Secondary | ICD-10-CM

## 2023-07-28 DIAGNOSIS — Z953 Presence of xenogenic heart valve: Secondary | ICD-10-CM

## 2023-07-28 DIAGNOSIS — I4891 Unspecified atrial fibrillation: Secondary | ICD-10-CM

## 2023-07-28 DIAGNOSIS — E785 Hyperlipidemia, unspecified: Secondary | ICD-10-CM

## 2023-07-28 DIAGNOSIS — Z7901 Long term (current) use of anticoagulants: Secondary | ICD-10-CM

## 2023-07-28 DIAGNOSIS — G4733 Obstructive sleep apnea (adult) (pediatric): Secondary | ICD-10-CM

## 2023-07-28 DIAGNOSIS — G629 Polyneuropathy, unspecified: Secondary | ICD-10-CM

## 2023-07-28 DIAGNOSIS — I4819 Other persistent atrial fibrillation: Secondary | ICD-10-CM

## 2023-07-28 LAB — POC CREATININE, RAD: CREATININE, POC: 1.8 mg/dL — ABNORMAL HIGH (ref 0.4–1.24)

## 2023-07-28 MED ORDER — IOHEXOL 350 MG IODINE/ML IV SOLN
100 mL | Freq: Once | INTRAVENOUS | 0 refills | Status: CP
Start: 2023-07-28 — End: ?
  Administered 2023-07-28: 14:00:00 100 mL via INTRAVENOUS

## 2023-07-28 MED ORDER — SODIUM CHLORIDE 0.9 % IJ SOLN
50 mL | Freq: Once | INTRAVENOUS | 0 refills | Status: CP
Start: 2023-07-28 — End: ?
  Administered 2023-07-28: 14:00:00 50 mL via INTRAVENOUS

## 2023-07-28 NOTE — Progress Notes
I had the pleasure of seeing Robert Mercado in the valve clinic for evaluation of his aortic stenosis.  He is a very pleasant 77 year old gentleman who lives in the [place] area and is referred by Dr. Radford Pax from our cardiology group.    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  Over the past several weeks he has noted increased shortness of breath and decreased exercise tolerance.  Repeat echo showed a sclerotic and very stenotic aortic valve that is basically immobile.  He has mild to moderate aortic insufficiency and low-flow low gradient severe aortic stenosis.  He is markedly symptomatic and that he is very limited ambulatory capacity.  He denies syncopal episodes.    We reviewed his echo, cath and his TAVR CTA that was done this morning.  He has a very angulated aortic annulus but appropriate for TAVR.  His femoral anatomy is appropriate for delivery.    I reviewed options with him including medical management, open surgical intervention as well as TAVR.  Given his significant comorbidities he would be high risk for open surgery.  We have mutually agreed on proceeding with TAVR as the best therapy.  I reviewed the risks of the procedure including bleeding, infection, stroke, dialysis and death.  He has expressed understanding and is comfortable with proceeding.    We are coordinating a date for his procedure.  I appreciate the opportunity to participate in his care.    Sindy Messing, M.D.

## 2023-07-28 NOTE — Progress Notes
Date of Service: 07/28/2023    Robert Mercado is a 77 y.o. male.       HPI       We had the pleasure of seeing Robert Mercado he was referred for aortic stenosis.  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  He was admitted in February to an outside hospital for chest discomfort.  His troponin was elevated consistent with NSTEMI.  He had a cardiac catheterization that showed 90% stenosis to the ramus.  There was attempt at PCI but it was unsuccessful.  He was readmitted a few days later with recurrent chest discomfort.  PCI to the ramus with was performed with a single drug-eluting stent.       He follows routinely with EP and saw Dr. Naoma Diener in June.  He was started on dronedarone for rhythm control.  He decided against ablation.  An echocardiogram was ordered that showed an EF of 58%.  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  He has aortic stenosis with a mean gradient of 25 mmHg and peak velocity of 3.4 m/s, DI of 0.2 and aortic valve area of 0.84.  This was thought to represent low-flow, low gradient severe aortic stenosis.  He is referred to valve clinic for further evaluation.     Robert Mercado lives with his spouse.  He continues to drive and is fairly independent but somewhat sedentary.  He is limited by hip pain and may eventually need hip surgery.  He does get short of breath with exertion.  He was short of breath walking to the pulmonary department today and had to take a wheelchair back to clinic.  He has chronic lower extremity edema that is fairly significant.  He rarely has chest discomfort but does get some pain when he takes a deep breath.  He sleeps in a recliner but states this is due to the discomfort from his hip.  He has not been using CPAP since sleeping in a recliner.     PFTs 9/10:  Normal.  FEV1 3.55 (131%), DLCO 20.75 (87%)     CTA 9/10:  IMPRESSION   CHEST:   1. Aortic valve calcification with mild dilatation of the ascending   thoracic aorta, measuring up to 4.7 cm.   2.  Cardiomegaly with coronary artery calcification.   3.  Subcentimeter right lower lobe pulmonary nodule, which is   indeterminate. Short-term follow-up CT chest in 3-6 months is recommended.   4.  No thoracic lymphadenopathy.     ABDOMEN AND PELVIS:   1.  Normal caliber abdominal aorta and iliac arteries with moderate   calcific atherosclerotic plaque.   2.  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 Additional small right renal lesion, which is indeterminate and   may represent an additional small tumor or hemorrhagic cyst. Urologic   consultation is recommended.   3.  No abdominopelvic lymphadenopathy.             Vitals:    07/28/23 1024   BP: (!) 152/88   BP Source: Arm, Left Upper   Pulse: 89   PainSc: Seven   Weight: 124.7 kg (275 lb)  Height: 182.9 cm (6')     Body mass index is 37.3 kg/m?Marland Kitchen     Past Medical History  Patient Active Problem List    Diagnosis Date Noted    Nonrheumatic aortic valve stenosis 07/28/2023    Renal mass 07/28/2023    Acute on chronic combined systolic (congestive) and diastolic (congestive) heart failure (HCC) 04/30/2023    Body mass index (BMI) 40.0-44.9, adult (HCC) 04/30/2023    Medication side effects 08/11/2022     While hospitalized in September 2023 patient was initiated on amiodarone for rate control in the setting of atrial fibrillation.  He developed an extensive, generalized micropapular rash thought to be due to amiodarone.      Edema of left lower extremity 02/18/2022    COVID-19 virus infection 10/17/2021    Combined systolic and diastolic heart failure (HCC) 07/30/2018 10/16/2020 - ECHO:  [facility])   Low normal LV systolic function.  Ejection fraction is 50 - 55%.  Moderate left ventricular hypertrophy.  Moderately increased left atrial size.  Moderately increased right atrial size.  Dilated right ventricle.  Mild aortic stenosis.  Mild aortic regurgitation.  Mild mitral regurgitation.  Moderate tricuspid regurgitation.   10/31/2021 - ECHO:  Left Ventricle: The left ventricular size is normal. Mild concentric hypertrophy. The left ventricular systolic function is normal. The ejection fraction by Simpson's biplane method is 60%. There are no segmental wall motion abnormalities. Left ventricular diastolic dysfunction. Cannot determine left atrial pressure.  Mitral Valve: Mild regurgitation. There is moderate mitral annular calcification without stenosis.  Aortic Valve: The valve is calcified. Mild-to-moderate stenosis. Trace regurgitation.  The ascending aorta is moderately dilated. The aortic root is moderately dilated.  Tricuspid Valve: No stenosis. Mild to moderate regurgitation. The estimated pulmonary artery systolic pressure is + right atrial pressure.Pacemaker lead present in the ventricle.  07/30/2022 - ECHO:  Technically difficult study due to suboptimal acoustic windows.  Normal left ventricle size and wall thickness.  Low normal ejection fraction.  Visually, left ventricular ejection fraction is 50-55%.  Abnormal septal motion probably from right ventricular pacing. Probably normal diastolic function.  Probably normal right ventricle size and mildly reduced systolic function.  Normal-sized atria.  Pacemaker leads present in the right sided heart chambers.  Nonspecific thickening of the mitral valve with mild annular calcification.  No stenosis.  Mild to moderate regurgitation.  Moderate tricuspid valve regurgitation.  Heavily calcified aortic valve.  Peak systolic velocity is 2.5 m/s, mean systolic gradient 13 mmHg, aortic valve area 1.8 cm? and DVI is 0.24.  In aggregate, weighing all hemodynamic/2D parameters, there is moderate stenosis based principally on DVI and gross morphology of the valve.  Moderate regurgitation.  Estimated peak systolic pulmonary artery pressure is 32 mm Hg.  Mild to moderate dilatation of the aortic root.  Moderately dilated proximal ascending thoracic aorta.  No pericardial effusion.      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 Mackey Birchwood: 30% instent restenosis of previously placed stent in the LAD, and patent 2nd DIAG stent and 60% distal LAD disease. Medical management  11/20/2016 - Cardiac Catheterization:  [facility])  Moderately depressed LV function, ejection fraction 30% to 40% with global hypokinesis and no mitral regurgitation, well compensated (EDP equals 10).  Noncritical coronary disease as noted; left main 0% stenosis, LAD proximal  0%, mid 30%, circumflex 0%, RCA (dominant) 0%.   10/19/2020 - Cardiac Catheterization:  [facility])  Mid LAD lesion is 55% stenosed. IFR does not indicate any hemodynamically significant disease.  1st Diag lesion is 70% stenosed. Small to medium size.  2nd Diag lesion is 50% stenosed. Small-vessel.  There is mild left ventricular systolic dysfunction. The ejection fraction is 45-50% with apical wall hypokinesis.  04/17/2022 - Regadenoson MPI:   This study is normal with no evidence of significant myocardial ischemia. Left ventricular systolic function is low normal. There are no high risk prognostic indicators present.  The pharmacologic ECG portion of the study is negative for ischemia.  Comparison is made with a prior ADAC study completed 07/09/2018.  Ejection fraction was 46%.  Compared to the previous study left ventricular ejection fraction is increased, no change in perfusion findings.  In aggregate the current study is low risk in regards to predicted annual cardiovascular mortality rate.  01/09/2023 - Cardiac Catheterization:  ([facility])  Unsuccessful PCI of the subdivision of the ramus intermedius secondary to difficulty with access and inability to adequately cannulate the left main trunk   01/15/2023 - Cardiac Catheterization:  ([facility])  Successful PTCA/DES of ramus intermedius with reduction in stenosis from 90% to 0%.       Peripheral neuropathy 11/12/2009         Review of Systems   Constitutional: Negative.   HENT: Negative.     Eyes: Negative.    Cardiovascular: Negative.    Respiratory: Negative.     Endocrine: Negative.    Hematologic/Lymphatic: Negative.    Skin: Negative.    Musculoskeletal: Negative.    Gastrointestinal: Negative.    Genitourinary: Negative.    Neurological: Negative.    Psychiatric/Behavioral: Negative.     Allergic/Immunologic: Negative.        Physical Exam  General Appearance: no acute distress  Skin: warm & intact  HEENT: unremarkable  Neck Veins: neck veins are flat & not distended  Carotid Arteries: no bruits  Chest Inspection: chest is normal in appearance  Auscultation/Percussion: lungs clear to auscultation, no rales, rhonchi, or wheezing  Cardiac Rhythm: regular rhythm & normal rate  Cardiac Auscultation: Normal S1 & S2, no S3 or S4, no rub  Murmurs: no cardiac murmurs   Extremities: 3+ bilateral lower extremity edema; 2+ symmetric distal pulses  Abdominal Exam: soft, non-tender, no masses, bowel sounds normal  Liver & Spleen: no organomegaly  Neurologic Exam: oriented to time, place and person; no focal neurologic deficits  Psychiatric: Normal mood and affect.  Behavior is normal. Judgment and thought content normal.        Cardiovascular Studies      Cardiovascular Health Factors  Vitals BP Readings from Last 3 Encounters:   07/28/23 (!) 152/88   07/28/23 (!) 152/88   07/23/23 122/74  Wt Readings from Last 3 Encounters:   07/28/23 124.7 kg (275 lb)   07/28/23 124.7 kg (275 lb)   07/23/23 125 kg (275 lb 9.6 oz)     BMI Readings from Last 3 Encounters:   07/28/23 37.30 kg/m?   07/28/23 37.30 kg/m?   07/23/23 37.37 kg/m?      Smoking Social History     Tobacco Use   Smoking Status Never   Smokeless Tobacco Never      Lipid Profile Cholesterol   Date Value Ref Range Status   07/29/2022 117 <200 MG/DL Final     HDL   Date Value Ref Range Status   07/29/2022 43 >40 MG/DL Final     LDL   Date Value Ref Range Status   07/29/2022 61 <100 mg/dL Final     Triglycerides   Date Value Ref Range Status   07/29/2022 74 <150 MG/DL Final      Blood Sugar No results found for: HGBA1C  Glucose   Date Value Ref Range Status   05/11/2023 102  Final   02/20/2023 130 (H) 74 - 106 Final   08/03/2022 91 70 - 100 MG/DL Final   84/13/2440 76 65 - 99 mg/dL Final     Comment:                   Fasting reference interval             Problems Addressed Today  Encounter Diagnoses   Name Primary?    Nonrheumatic aortic valve stenosis Yes    Obesity (BMI 30-39.9)     OSA (obstructive sleep apnea)     Paroxysmal atrial fibrillation (HCC)     Renal mass     SSS (sick sinus syndrome) (HCC)     Chronic combined systolic and diastolic heart failure (HCC)     Cardiac pacemaker in situ     Coronary artery disease involving native coronary artery of native heart without angina pectoris        Assessment and Plan     1.  Aortic stenosis.  Dr. Helen Hashimoto and Dr. Paris Lore reviewed his echocardiogram.  He does have severe aortic stenosis and has class II symptoms.  His workup for TAVR is complete.  His CTA was also reviewed and his anatomy is suitable for TAVR.  We will schedule the procedure in the next few weeks.     2.  Chronic systolic and diastolic heart failure.  He is NYHA class II symptoms.  He has chronic lower extremity edema.  He is on Bumex 2 mg twice a day.     3.  Ischemic cardiomyopathy.  EF is 50%.     4.  Coronary disease.  He has had remote PCI to the LAD and diagonal and recent PCI to the ramus.  He continues Plavix.  He denies any anginal symptoms.     5.  Paroxysmal atrial arrhythmias.  He follows closely with the EP.  He  was started on dronedarone.  He is on warfarin for anticoagulation.     6.  Sinus node dysfunction with pacemaker in place     7.  PVCs status post ablation.     8.  OSA.  He is not currently using CPAP.     9.  CKD stage IIIa.     10.  Renal mass.  He was incidentally noted to have a 3.6 cm left upper pole renal mass noted on CTA today.  We will  refer him to urology for further workup.  This will not interfere with plans for TAVR however.     Thank you for allowing [place] to participate in the care of this pleasant individual.  If you have any other questions or concerns, please do not hesitate to contact us.     Sherrlyn Hock, APRN  Structural Heart Nurse Practitioner  Pager [phone]     Robert Mercado is a 77 year old male with severe symptomatic aortic stenosis.  He has had majority of his workup for aortic valve replacement which we have had a chance to review.  He did have a recent cardiac catheterization with percutaneous intervention to an intermediate ramus vessel back in February and has not had any progressive anginal symptoms.  He does have exertional shortness of breath and fatigue and his CT scan looks appropriate anatomically for aortic valve replacement.  We will plan to tentatively get him scheduled for a transcatheter-based aortic valve replacement.  He did incidentally have a finding of a 3.6 cm renal mass on the left upper pole of his left kidney.  He is going to get referred to urology for further workup concurrently.  Will plan to keep you informed as to his progress.  Thank you for allowing [place] to participate in his care.  Robert Pastor, MD      Current Medications (including today's revisions)   atorvastatin (LIPITOR) 80 mg tablet Take one tablet by mouth daily.    bumetanide (BUMEX) 2 mg tablet Take one tablet by mouth twice daily. Indications: accumulation of fluid resulting from chronic heart failure (Patient taking differently: Take one tablet by mouth every 48 hours. Indications: accumulation of fluid resulting from chronic heart failure)    clopiDOGreL (PLAVIX) 75 mg tablet Take one tablet by mouth daily.    coenzyme Q10 200 mg capsule Take one capsule by mouth at bedtime daily.    dilTIAZem CD (CARDIZEM CD) 180 mg capsule Take one capsule by mouth twice daily. Indications: paroxysmal supraventricular tachycardia    diphenhydrAMINE HCL (BENADRYL) 25 mg capsule Take one capsule by mouth every 6 hours as needed. Indications: hives    dronedarone (MULTAQ) 400 mg tablet Take one tablet by mouth twice daily with meals.    finasteride (PROSCAR) 5 mg tablet Take one tablet by mouth at bedtime daily.    fluticasone (FLONASE) 50 mcg/actuation nasal spray Apply two sprays to each nostril as directed as Needed. Shake bottle gently before using.    gabapentin (NEURONTIN) 300 mg capsule Take three capsules by mouth three times daily.    HYDROcodone/acetaminophen (NORCO) 10/325 mg tablet Take one tablet by mouth three times daily as needed for Pain.    isosorbide mononitrate ER (IMDUR) 30 mg tablet,extended release 24 hr Take one tablet by mouth every morning.    MELATONIN PO Take 12 mg by mouth at bedtime daily.    metoprolol succinate XL (TOPROL XL) 25 mg extended release tablet Take one tablet by mouth twice daily. Indications: high blood pressure    multivitamin (ONE-A-DAY) tablet Take one tablet by mouth daily.    nitroglycerin (NITROSTAT) 0.4 mg tablet Take one tablet sublingually as needed every 5 min for chest pain X three    Pantoprazole (PROTONIX) 40 mg granules Take forty mg by mouth daily.    potassium chloride SR (KLOR-CON M20) 20 mEq tablet Take two tablets by mouth daily.    vit A/vit C/vit E/zinc/copper (PRESERVISION AREDS PO) Take 1 tablet by mouth twice daily.  warfarin (COUMADIN) 1 mg tablet Take 1-2 tablets by mouth in combination with 4 mg tablets as directed by PCP    warfarin (COUMADIN) 4 mg tablet Take one tablet by mouth daily. **DO NOT TAKE THIS UNTIL INDICATED BASED ON INR RESULTS**

## 2023-07-28 NOTE — Patient Instructions
Dear [name],    You have been scheduled for surgery with Dr. Helen Hashimoto and Dr. Paris Lore on 10/3.    Per the recommendations of your doctors, you will need to be off your warfarin (Coumadin) for 5 days before your procedure. Your last dose will be 9/27 (take). You should stop your warfarin on 9/28 and after.      You may continue to take all of your other medications. You will be given instructions on any other medications to hold the day of surgery at your Pre-Op appointment.    Please reach out to valve clinic nurse coordinators, Doralee or [place], with questions: [phone].    Sincerely,    Doralee, RN & Leeroy Bock, RN

## 2023-07-28 NOTE — Progress Notes
Date of Service: 07/28/2023       Subjective:             Robert Mercado is a 77 y.o. male.      History of Present Illness    We had the pleasure of seeing Robert Mercado he was referred for aortic stenosis.  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  He was admitted in February to an outside hospital for chest discomfort.  His troponin was elevated consistent with NSTEMI.  He had a cardiac catheterization that showed 90% stenosis to the ramus.  There was attempt at PCI but it was unsuccessful.  He was readmitted a few days later with recurrent chest discomfort.  PCI to the ramus with was performed with a single drug-eluting stent.      He follows routinely with EP and saw Dr. Naoma Mercado in June.  He was started on dronedarone for rhythm control.  He decided against ablation.  An echocardiogram was ordered that showed an EF of 58%.  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  He has aortic stenosis with a mean gradient of 25 mmHg and peak velocity of 3.4 m/s, DI of 0.2 and aortic valve area of 0.84.  This was thought to represent low-flow, low gradient severe aortic stenosis.  He is referred to valve clinic for further evaluation.    Robert Mercado lives with his spouse.  He continues to drive and is fairly independent but somewhat sedentary.  He is limited by hip pain and may eventually need hip surgery.  He does get short of breath with exertion.  He was short of breath walking to the pulmonary department today and had to take a wheelchair back to clinic.  He has chronic lower extremity edema that is fairly significant.  He rarely has chest discomfort but does get some pain when he takes a deep breath.  He sleeps in a recliner but states this is due to the discomfort from his hip.  He has not been using CPAP since sleeping in a recliner.    PFTs 9/10:  Normal.  FEV1 3.55 (131%), DLCO 20.75 (87%)    CTA 9/10:  IMPRESSION   CHEST:   1. Aortic valve calcification with mild dilatation of the ascending   thoracic aorta, measuring up to 4.7 cm.   2.  Cardiomegaly with coronary artery calcification.   3.  Subcentimeter right lower lobe pulmonary nodule, which is   indeterminate. Short-term follow-up CT chest in 3-6 months is recommended.   4.  No thoracic lymphadenopathy.     ABDOMEN AND PELVIS:   1.  Normal caliber abdominal aorta and iliac arteries with moderate   calcific atherosclerotic plaque.   2.  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 Additional small right renal lesion, which is indeterminate and   may represent an additional small tumor or hemorrhagic cyst. Urologic   consultation is recommended.   3.  No abdominopelvic lymphadenopathy.        Review of Systems   Constitutional: Positive for malaise/fatigue.   HENT: Negative.     Eyes: Negative.    Cardiovascular: Negative.    Respiratory:  Positive for shortness of breath.    Endocrine: Negative.    Hematologic/Lymphatic: Negative.    Skin: Negative.    Musculoskeletal:  Positive for joint pain (RT hip).   Gastrointestinal: Negative.    Genitourinary: Negative.    Neurological: Negative.    Psychiatric/Behavioral: Negative.     Allergic/Immunologic: Negative.        Objective:          atorvastatin (LIPITOR) 80 mg tablet Take one tablet by mouth daily.    bumetanide (BUMEX) 2 mg tablet Take one tablet by mouth twice daily. Indications: accumulation of fluid resulting from chronic heart failure (Patient taking differently: Take one tablet by mouth every 48 hours. Indications: accumulation of fluid resulting from chronic heart failure)    clopiDOGreL (PLAVIX) 75 mg tablet Take one tablet by mouth daily.    coenzyme Q10 200 mg capsule Take one capsule by mouth at bedtime daily.    dilTIAZem CD (CARDIZEM CD) 180 mg capsule Take one capsule by mouth twice daily. Indications: paroxysmal supraventricular tachycardia    diphenhydrAMINE HCL (BENADRYL) 25 mg capsule Take one capsule by mouth every 6 hours as needed. Indications: hives    dronedarone (MULTAQ) 400 mg tablet Take one tablet by mouth twice daily with meals.    finasteride (PROSCAR) 5 mg tablet Take one tablet by mouth at bedtime daily.    fluticasone (FLONASE) 50 mcg/actuation nasal spray Apply two sprays to each nostril as directed as Needed. Shake bottle gently before using.    gabapentin (NEURONTIN) 300 mg capsule Take three capsules by mouth three times daily.    HYDROcodone/acetaminophen (NORCO) 10/325 mg tablet Take one tablet by mouth three times daily as needed for Pain.    isosorbide mononitrate ER (IMDUR) 30 mg tablet,extended release 24 hr Take one tablet by mouth every morning.    MELATONIN PO Take 12 mg by mouth at bedtime daily.    metoprolol succinate XL (TOPROL XL) 25 mg extended release tablet Take one tablet by mouth twice daily. Indications: high blood pressure    multivitamin (ONE-A-DAY) tablet Take one tablet by mouth daily.    nitroglycerin (NITROSTAT) 0.4 mg tablet Take one tablet sublingually as needed every 5 min for chest pain X three    Pantoprazole (PROTONIX) 40 mg granules Take forty mg by mouth daily.    potassium chloride SR (KLOR-CON M20) 20 mEq tablet Take two tablets by mouth daily.    vit A/vit C/vit E/zinc/copper (PRESERVISION AREDS PO) Take 1 tablet by mouth twice daily.    warfarin (COUMADIN) 1 mg tablet Take 1-2 tablets by mouth in combination with 4 mg tablets as directed by PCP    warfarin (COUMADIN) 4 mg tablet Take one tablet by mouth daily. **DO NOT TAKE THIS UNTIL INDICATED BASED ON INR RESULTS**     Vitals:    07/28/23 1019   BP: (!) 152/88   BP Source: Arm, Right Upper Pulse: 89   SpO2: 98%   PainSc: Seven   Weight: 124.7 kg (275 lb)   Height: 182.9 cm (6')     Body mass index is 37.3 kg/m?Marland Kitchen     Physical Exam  General Appearance: no acute distress  Skin: warm & intact  HEENT: unremarkable  Neck Veins: neck veins are flat & not distended  Carotid Arteries: no bruits  Chest Inspection: chest is normal in appearance  Auscultation/Percussion: lungs clear to auscultation, no rales, rhonchi, or wheezing  Cardiac Rhythm: regular rhythm & normal rate  Cardiac Auscultation: Normal  S1 & S2, no S3 or S4, no rub  Murmurs: no cardiac murmurs   Extremities: 3+ bilateral lower extremity edema; 2+ symmetric distal pulses  Abdominal Exam: soft, non-tender, no masses, bowel sounds normal  Liver & Spleen: no organomegaly  Neurologic Exam: oriented to time, place and person; no focal neurologic deficits  Psychiatric: Normal mood and affect.  Behavior is normal. Judgment and thought content normal.          Assessment and Plan:    1.  Aortic stenosis.  Dr. Helen Hashimoto and Dr. Paris Lore reviewed his echocardiogram.  He does have severe aortic stenosis and has class II symptoms.  His workup for TAVR is complete.  His CTA was also reviewed and his anatomy is suitable for TAVR.  We will schedule the procedure in the next few weeks.    2.  Chronic systolic and diastolic heart failure.  He is NYHA class II symptoms.  He has chronic lower extremity edema.  He is on Bumex 2 mg twice a day.    3.  Ischemic cardiomyopathy.  EF is 50%.    4.  Coronary disease.  He has had remote PCI to the LAD and diagonal and recent PCI to the ramus.  He continues Plavix.  He denies any anginal symptoms.    5.  Paroxysmal atrial arrhythmias.  He follows closely with the EP.  He  was started on dronedarone.  He is on warfarin for anticoagulation.    6.  Sinus node dysfunction with pacemaker in place    7.  PVCs status post ablation.    8.  OSA.  He is not currently using CPAP.    9.  CKD stage IIIa.    10.  Renal mass.  He was incidentally noted to have a 3.6 cm left upper pole renal mass noted on CTA today.  We will refer him to urology for further workup.  This will not interfere with plans for TAVR however.    Thank you for allowing [place] to participate in the care of this pleasant individual.  If you have any other questions or concerns, please do not hesitate to contact us.    Sherrlyn Hock, APRN  Structural Heart Nurse Practitioner  Pager [phone]      I had the pleasure of seeing Mr. Harpham in the valve clinic for evaluation of his aortic stenosis.  He is a very pleasant 77 year old gentleman who lives in the [place] area and is referred by Dr. Radford Pax from our cardiology group.    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  Over the past several weeks he has noted increased shortness of breath and decreased exercise tolerance.  Repeat echo showed a sclerotic and very stenotic aortic valve that is basically immobile.  He has mild to moderate aortic insufficiency and low-flow low gradient severe aortic stenosis.  He is markedly symptomatic and that he is very limited ambulatory capacity.  He denies syncopal episodes.    We reviewed his echo, cath and his TAVR CTA that was done this morning.  He has a very angulated aortic annulus but appropriate for TAVR.  His femoral anatomy is appropriate for delivery.    I reviewed options with him including medical management, open surgical intervention as well as TAVR.  Given his significant comorbidities he would be high risk for open surgery.  We have mutually agreed on proceeding with TAVR as the best therapy.  I reviewed the risks of the  procedure including bleeding, infection, stroke, dialysis and death.  He has expressed understanding and is comfortable with proceeding.    We are coordinating a date for his procedure.  I appreciate the opportunity to participate in his care.    Sindy Messing, M.D.

## 2023-07-29 ENCOUNTER — Encounter: Admit: 2023-07-29 | Discharge: 2023-07-29 | Payer: MEDICARE

## 2023-07-29 NOTE — Progress Notes
Patient's CTA report faxed to patient's PCP, Dwaine Gale, APRN.      [facility]  Please review mutual patient's CTA report that was completed as part of the work up for TAVR. We will defer to your office to manage the incidental finding of a pulmonary nodule. We have referred patient to urology for the incidental finding of a renal mass. Patient is aware of findings. Please reach out if you have any questions.      Thank you!    Leeroy Bock, RN  [facility]  [facility] Nurse Coordinator  [phone]

## 2023-07-30 ENCOUNTER — Encounter: Admit: 2023-07-30 | Discharge: 2023-07-30 | Payer: MEDICARE

## 2023-07-30 NOTE — Progress Notes
Name: Robert Mercado          MRN: [id number]      DOB: 08/19/1946      AGE: 77 y.o.   DATE OF SERVICE: 08/03/2023    Subjective:             Reason for Visit:  No chief complaint on file.      Robert Mercado is a 77 y.o. male.      Cancer Staging   No matching staging information was found for the patient.      History of Present Illness  77 y/o male who underwent imaging in anticipation of cardiac procedure. He is anticipated to undergo TAVR in the near future.   CHEST:   1. Aortic valve calcification with mild dilatation of the ascending   thoracic aorta, measuring up to 4.7 cm.   2.  Cardiomegaly with coronary artery calcification.   3.  Subcentimeter right lower lobe pulmonary nodule, which is   indeterminate. Short-term follow-up CT chest in 3-6 months is recommended.   4.  No thoracic lymphadenopathy.   ABDOMEN AND PELVIS:   1.  Normal caliber abdominal aorta and iliac arteries with moderate   calcific atherosclerotic plaque.   2.  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 Additional small right renal lesion, which is indeterminate and   may represent an additional small tumor or hemorrhagic cyst. Urologic   consultation is recommended.   3.  No abdominopelvic lymphadenopathy.   #FOLLOW   These findings were messaged to Robert Mercado by Dr. Mayford Knife at 9:40 AM   on 07/28/2023 over [employer].   Finalized by Jake Bathe, M.D. on 07/28/2023 9:41 AM. Dictated by   Jake Bathe, M.D. on 07/28/2023 9:30 AM.            Objective:          atorvastatin (LIPITOR) 80 mg tablet Take one tablet by mouth daily.    bumetanide (BUMEX) 2 mg tablet Take one tablet by mouth twice daily. Indications: accumulation of fluid resulting from chronic heart failure (Patient taking differently: Take one tablet by mouth every 48 hours. Indications: accumulation of fluid resulting from chronic heart failure)    clopiDOGreL (PLAVIX) 75 mg tablet Take one tablet by mouth daily. coenzyme Q10 200 mg capsule Take one capsule by mouth at bedtime daily.    dilTIAZem CD (CARDIZEM CD) 180 mg capsule Take one capsule by mouth twice daily. Indications: paroxysmal supraventricular tachycardia    diphenhydrAMINE HCL (BENADRYL) 25 mg capsule Take one capsule by mouth every 6 hours as needed. Indications: hives    dronedarone (MULTAQ) 400 mg tablet Take one tablet by mouth twice daily with meals.    finasteride (PROSCAR) 5 mg tablet Take one tablet by mouth at bedtime daily.    fluticasone (FLONASE) 50 mcg/actuation nasal spray Apply two sprays to each nostril as directed as Needed. Shake bottle gently before using.    gabapentin (NEURONTIN) 300 mg capsule Take three capsules by mouth three times daily.    HYDROcodone/acetaminophen (NORCO) 10/325 mg tablet Take one tablet by mouth three times daily as needed for Pain.    isosorbide mononitrate ER (IMDUR) 30 mg tablet,extended release 24 hr Take one tablet by mouth every morning.    MELATONIN PO Take 12 mg by mouth at bedtime daily.    metoprolol succinate XL (TOPROL XL) 25 mg extended release tablet Take one tablet by mouth twice  daily. Indications: high blood pressure    multivitamin (ONE-A-DAY) tablet Take one tablet by mouth daily.    nitroglycerin (NITROSTAT) 0.4 mg tablet Take one tablet sublingually as needed every 5 min for chest pain X three    Pantoprazole (PROTONIX) 40 mg granules Take forty mg by mouth daily.    potassium chloride SR (KLOR-CON M20) 20 mEq tablet Take two tablets by mouth daily.    vit A/vit C/vit E/zinc/copper (PRESERVISION AREDS PO) Take 1 tablet by mouth twice daily.    warfarin (COUMADIN) 1 mg tablet Take 1-2 tablets by mouth in combination with 4 mg tablets as directed by PCP    warfarin (COUMADIN) 4 mg tablet Take one tablet by mouth daily. **DO NOT TAKE THIS UNTIL INDICATED BASED ON INR RESULTS**     There were no vitals filed for this visit.  There is no height or weight on file to calculate BMI.                Pain Addressed:  N/A    Patient Evaluated for a Clinical Trial: No treatment clinical trial available for this patient.     [place] Cooperative Oncology Group performance status is 2, Ambulatory and capable of all selfcare but unable to carry out any work activities. Up and about more than 50% of waking hours.     Physical Exam   alert in NAD  Breathing unlabored  Neuro grossly intact         Assessment and Plan:  Robert Mercado is a very pleasant 77 y/o male with a newly found left renal mass.  The mass is 3.6 cm in size and enhancing with IV contrast.      I have reviewed the notes from previous providers pertaining to the renal mass. I reviewed the images and reports of the cross-sectional radiology findings with the patient.  We discussed the implications of enhancing renal masses and the natural history of kidney cancer.  I explained that there is a small chance the renal mass is a benign lesion.  We went over an extensive discussion about the potential management of a small renal mass.  These include active surveillance with or without biopsy, biopsy of the mass and subsequent decision, ablative therapy with cryotherapy, RFA, or microwave, partial nephrectomy (robotic, laparoscopic, open), and radical nephrectomy.    After careful consideration, and shared decision making, patient is most comfortable with proceeding with active surveillance. Patient is very comfortable with this approach. The patient understands the small risk of metastasis in the short periods of surveillance. I went over the typical recommended regimen with active surveillance per AUA/NCCN guidelines. Patient will RTC in 6 months with CT Abdomen w/wo IV contrast, CXR, and BMP.

## 2023-08-03 ENCOUNTER — Encounter: Admit: 2023-08-03 | Discharge: 2023-08-03 | Payer: MEDICARE

## 2023-08-03 DIAGNOSIS — N2889 Other specified disorders of kidney and ureter: Secondary | ICD-10-CM

## 2023-08-07 ENCOUNTER — Encounter: Admit: 2023-08-07 | Discharge: 2023-08-07 | Payer: MEDICARE

## 2023-08-07 DIAGNOSIS — I480 Paroxysmal atrial fibrillation: Secondary | ICD-10-CM

## 2023-08-07 DIAGNOSIS — Z7901 Long term (current) use of anticoagulants: Secondary | ICD-10-CM

## 2023-08-11 ENCOUNTER — Encounter: Admit: 2023-08-11 | Discharge: 2023-08-11 | Payer: MEDICARE

## 2023-08-11 DIAGNOSIS — I350 Nonrheumatic aortic (valve) stenosis: Secondary | ICD-10-CM

## 2023-08-11 MED ORDER — LIDOCAINE (PF) 10 MG/ML (1 %) IJ SOLN
.2 mL | INTRAMUSCULAR | 0 refills | PRN
Start: 2023-08-11 — End: ?

## 2023-08-11 MED ORDER — ASPIRIN 81 MG PO TBEC
81 mg | Freq: Once | ORAL | 0 refills
Start: 2023-08-11 — End: ?

## 2023-08-11 MED ORDER — SODIUM CHLORIDE 0.9 % IV SOLP
INTRAVENOUS | 0 refills
Start: 2023-08-11 — End: ?

## 2023-08-16 ENCOUNTER — Encounter: Admit: 2023-08-16 | Discharge: 2023-08-16 | Payer: MEDICARE

## 2023-08-17 ENCOUNTER — Encounter: Admit: 2023-08-17 | Discharge: 2023-08-17 | Payer: MEDICARE

## 2023-08-17 ENCOUNTER — Ambulatory Visit: Admit: 2023-08-17 | Discharge: 2023-08-17 | Payer: MEDICARE

## 2023-08-17 DIAGNOSIS — S0300XA Dislocation of jaw, unspecified side, initial encounter: Secondary | ICD-10-CM

## 2023-08-17 DIAGNOSIS — N183 CKD (chronic kidney disease) stage 3, GFR 30-59 ml/min (HCC): Secondary | ICD-10-CM

## 2023-08-17 DIAGNOSIS — Z950 Presence of cardiac pacemaker: Secondary | ICD-10-CM

## 2023-08-17 DIAGNOSIS — I219 Acute myocardial infarction, unspecified: Secondary | ICD-10-CM

## 2023-08-17 DIAGNOSIS — A4902 Methicillin resistant Staphylococcus aureus infection, unspecified site: Secondary | ICD-10-CM

## 2023-08-17 DIAGNOSIS — R0609 Other forms of dyspnea: Secondary | ICD-10-CM

## 2023-08-17 DIAGNOSIS — E785 Hyperlipidemia, unspecified: Secondary | ICD-10-CM

## 2023-08-17 DIAGNOSIS — M1990 Unspecified osteoarthritis, unspecified site: Secondary | ICD-10-CM

## 2023-08-17 DIAGNOSIS — I509 Heart failure, unspecified: Secondary | ICD-10-CM

## 2023-08-17 DIAGNOSIS — I350 Nonrheumatic aortic (valve) stenosis: Secondary | ICD-10-CM

## 2023-08-17 DIAGNOSIS — I2510 Atherosclerotic heart disease of native coronary artery without angina pectoris: Secondary | ICD-10-CM

## 2023-08-17 DIAGNOSIS — I480 Paroxysmal atrial fibrillation: Secondary | ICD-10-CM

## 2023-08-17 DIAGNOSIS — C4491 Basal cell carcinoma of skin, unspecified: Secondary | ICD-10-CM

## 2023-08-17 DIAGNOSIS — Z9861 Coronary angioplasty status: Secondary | ICD-10-CM

## 2023-08-17 DIAGNOSIS — I499 Cardiac arrhythmia, unspecified: Secondary | ICD-10-CM

## 2023-08-17 DIAGNOSIS — Z7901 Long term (current) use of anticoagulants: Secondary | ICD-10-CM

## 2023-08-17 DIAGNOSIS — I4891 Unspecified atrial fibrillation: Secondary | ICD-10-CM

## 2023-08-17 DIAGNOSIS — I272 Pulmonary hypertension, unspecified: Secondary | ICD-10-CM

## 2023-08-17 DIAGNOSIS — K219 Gastro-esophageal reflux disease without esophagitis: Secondary | ICD-10-CM

## 2023-08-17 DIAGNOSIS — I10 Essential (primary) hypertension: Secondary | ICD-10-CM

## 2023-08-17 DIAGNOSIS — I340 Nonrheumatic mitral (valve) insufficiency: Secondary | ICD-10-CM

## 2023-08-17 DIAGNOSIS — I495 Sick sinus syndrome: Secondary | ICD-10-CM

## 2023-08-17 DIAGNOSIS — M549 Dorsalgia, unspecified: Secondary | ICD-10-CM

## 2023-08-17 DIAGNOSIS — G629 Polyneuropathy, unspecified: Secondary | ICD-10-CM

## 2023-08-17 DIAGNOSIS — Z0183 Encounter for blood typing: Secondary | ICD-10-CM

## 2023-08-17 DIAGNOSIS — E669 Obesity, unspecified: Secondary | ICD-10-CM

## 2023-08-17 DIAGNOSIS — R001 Bradycardia, unspecified: Secondary | ICD-10-CM

## 2023-08-17 DIAGNOSIS — G4730 Sleep apnea, unspecified: Secondary | ICD-10-CM

## 2023-08-17 LAB — URINALYSIS DIPSTICK REFLEX TO CULTURE
LEUKOCYTES: NEGATIVE
NITRITE: NEGATIVE
URINE ASCORBIC ACID, UA: POSITIVE — AB
URINE BILE: NEGATIVE (ref 3–12)
URINE BLOOD: NEGATIVE mL/min — ABNORMAL LOW
URINE KETONE: NEGATIVE U/L (ref 7–56)

## 2023-08-17 LAB — CBC
RBC COUNT: 3.7 M/UL — ABNORMAL LOW (ref 4.4–5.5)
WBC COUNT: 5.2 10*3/uL (ref 4.5–11.0)

## 2023-08-17 LAB — URINALYSIS MICROSCOPIC REFLEX TO CULTURE

## 2023-08-17 LAB — COMPREHENSIVE METABOLIC PANEL
CALCIUM: 9.1 mg/dL — ABNORMAL LOW (ref 8.5–10.6)
CHLORIDE: 104 MMOL/L (ref 98–110)
CREATININE: 1.6 mg/dL — ABNORMAL HIGH (ref 0.4–1.24)
SODIUM: 138 MMOL/L — ABNORMAL LOW (ref 137–147)

## 2023-08-17 LAB — PTT (APTT): PTT: 37 s — ABNORMAL HIGH (ref 24.0–36.5)

## 2023-08-17 LAB — PROTIME INR (PT): PROTIME: 20 s — ABNORMAL HIGH (ref 10.2–12.9)

## 2023-08-17 LAB — HEMOGLOBIN A1C: HEMOGLOBIN A1C: 5.5 % — ABNORMAL LOW (ref 4.0–5.7)

## 2023-08-17 NOTE — Progress Notes
5 Meter walks complete:     1st Trial = 11.98 seconds  2nd Trial = 12.01 seconds  3rd Trial = 12.11 seconds    Using cane due to rt hip pain/needing hip surgery

## 2023-08-17 NOTE — Discharge Instructions - Pharmacy
 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]  Nonrheumatic aortic (valve) stenosis [I35.0]    Significant PMH:   Past Medical History:   Diagnosis Date    Aortic stenosis     Arthritis     Atrial fibrillation (HCC)     Back pain     Basal cell carcinoma     CAD (coronary artery disease)     Cardiac dysrhythmia     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    CHF (congestive heart failure)     Chronic anticoagulation, on warfarin 08/16/2014    CKD (chronic kidney disease) stage 3, GFR 30-59 ml/min (HCC)     DOE (dyspnea on exertion)     GERD (gastroesophageal reflux disease)     H/O percutaneous transluminal coronary angioplasty     HLD (hyperlipidemia) 11/12/2009    Hyperlipemia     Hypertension     MI (myocardial infarction) (HCC)     MR (mitral regurgitation)     MRSA infection 2012    L hand    Obesity (BMI 30-39.9) 08/17/2013    Paroxysmal atrial fibrillation (HCC) 04/19/2012    Peripheral neuropathy 11/12/2009    Pulmonary hypertension (HCC)     Sinus bradycardia 03/16/2010    Pacemaker 05/21/2010    Sleep apnea 08/17/2013    Squamous cell carcinoma nos     SSS (sick sinus syndrome) (HCC) 05/21/2010    TMJ (dislocation of temporomandibular joint)          Allergies: Amiodarone hcl, Duloxetine, Lyrica [pregabalin], Demerol (pf) [meperidine (pf)], and Tuberculin ppd tine test    Admission Physical Exam notable for:  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 Spectral Doppler data and 2D echo eval of the aortic valve revealed that this is very likely low normal flow -- low gradient severe AS. hgb 11.2. PLT 132k. INR 1.9. Cr+ 1.66. GFR 42. BUN 37. NT-Pro-BNP 1088.    Brief Hospital Course:  The patient was admitted to [facility] for elective transcatheter aortic valve replacement.  The patient underwent the procedure and tolerated it well.  They were monitored on the cardiothoracic progressive care unit following surgery.  Their rhythm remained stable overnight.   A post-operative echocardiogram demonstrated an unremarkable prosthetic valve. Currently the patient is ambulating with a roller walker. The patient does have some walking issues due to his hips and uses a cane at home. We did have physical therapy evaluate the patient and they recommended home with some supervision. The patient does have a  family member that lives very close to him. He is tolerating oral intake and  his pain is well controlled. At this time we feel comfortable discharging the patient home on POD#1.     Pending items needing follow up:  Structural heart team f/u    Condition at Discharge: Stable     Discharge Diagnoses:    Hospital Problems          Active Problems    * (Principal) S/p TAVR (transcatheter aortic valve replacement), bioprosthetic    HLD (hyperlipidemia)    CAD (coronary artery disease)    Peripheral neuropathy    SSS (sick sinus syndrome) (HCC)    Cardiac pacemaker in situ    Paroxysmal atrial fibrillation (HCC)    OSA (obstructive sleep apnea)    BMI 37.0-37.9, adult    Chronic anticoagulation, on warfarin    LAFB (left anterior fascicular block)    Chronic combined systolic and diastolic congestive heart failure, NYHA class 2    Nonrheumatic aortic valve stenosis    Hypokalemia    Hypocalcemia       Surgical Procedures:   08/20/23: Dr. Linford Labrum and Dr. Grayson - Transcatheter Aortic Valve Replacement - Femoral Artery 29 S3  Ultrasound-guided access to bilateral common femoral arteries and left common femoral vein  Placement of balloontipped temporary pacemaker  Ascending aortogram  Iliofemoral arteriogram with runoff    Significant Diagnostic Studies and Procedures: noted in brief hospital course  Limited POD#1 Echocardiogram: 08/21/23  Technically challenging study with poor endocardial visualization.  Left ventricular size is normal.  Left ventricular systolic function is low normal with a visually estimated LVEF of 50-55. Abnormal septal motion with paced rhythm.   Right ventricle is dilated with probably preserved systolic function.  There is at least moderate tricuspid valve regurgitation with estimated peak systolic PA pressure of 46 mmHg.  IVC is dilated with less than 50% change with inspiration.  There is a 29 mm SAPIEN S3 ultra bioprosthetic valve in the aortic valve position.  No stenosis with peak velocity of 1.8 m/s.  Mean gradient of 6.5 mmHg.  DVI of 0.44.  No significant regurgitation noted.  Mitral annular calcifications are present with preserved diastolic excursion of leaflets.  No stenosis or significant regurgitation.  No pericardial effusion   Consults:  None    Patient Disposition: Home       Patient instructions/medications:      Cardiac Diet    Limiting unhealthy fats and cholesterol is the most important step you can take in reducing your risk for cardiovascular disease. Unhealthy fats include saturated and trans fats. Monitor your sodium and cholesterol intake. Restrict your sodium to 2g (grams) or 2000mg  (milligrams) daily, and your cholesterol to 200mg  daily.    If you have questions regarding your diet at home, you may contact a dietitian at [phone].     Other Activity Restrictions    -You should and need to walk daily. Your goal is to walk 30 minutes at at time without stopping for breaks. This is a daily exercise routine that you should start as soon as you arrive home. Your basic daily activities do not count toward your 30 minute minimum, e.g. housework, toileting, fixing meals. Do not exercise outside in extremely hot or cold temperatures.  -Driving: NO driving for a minimum of 3 days. In addition, if you are using narcotic pain medication or experiencing unsteadiness, dizziness, lightheadedness, or instability in movement, you should not be operating any type of  vehicle; car, tractor, etc. Designer, industrial/product may provide additional driving restrictions at their discretion based on the type and extent of surgery you had. Safely operating a vehicle is the responsibility of the driver.   -Lifting: NO lifting more than 10 pounds (gallon of milk) for 2 weeks.  -Monitoring: If you have access to a home blood pressure cuff, record your blood pressure and heart rate daily.   Please call if your systolic blood pressure is <90 or >160, OR  if your heart rate is <50 or >120 at rest     Incision Care and Bathing Instructions    Keep your incision(s) clean and dry.  Spray your incision with the provided wound cleanser twice a day until your incision is healed, or the bottle is empty.   It is ok to shower unless otherwise noted.  Do not soak in a bath tub, hot tub, pool or lake for 6 weeks.  Do not use creams or lotions on incision until it is fully healed.  If your surgeon used Dermabond (skin glue), this will fall off gradually, do not pick at the glue.  If you notice redness, swelling, drainage, foul odor, or sutures sticking up through your incision, please call the Cardiothoracic Surgery clinic immediately.  [phone]     Report These Signs and Symptoms    Please contact your doctor for the following symptoms:  *Temperature over 100 degrees F  *Uncontrolled pain  *Drainage with a foul odor  *Shortness of breath  *Racing or skipping heart beats  *Swelling or tenderness in your groin  *Painful/Cold/Discolored legs or toes  *Suture material sticking up through your incisions  *Change in coordination of ability to talk  *If you gain more than 2 pounds in 24 hours, or 5 pounds in one week.     36-Month Post-Operative Follow Up Appointment    Structural heart follow up  Echocardiogram 2:45, appointment will follow.  Check-in for your appointment at the Cardiovascular Medicine and Cardiovascular and Thoracic Surgery office located by the main entrance of the hospital.     [place] Provider LENON NEST DAWN [[id number]]    [facility]    Appointment date: 10/09/2023    Appointment time: 2:45 PM      Primary Care Provider Appointment    Eward Best [phone]  Call to schedule an appointment with your primary care doctor in 1-2 weeks for a checkup and medication review.     1-Week Post-Operative Follow Up Appointment    Structural heart follow up  Check-in for your appointment at the Cardiovascular Medicine and Cardiovascular and Thoracic Surgery office located by the main entrance of the hospital.     [place] Provider CLEOLA HART CROME [[id number]]    Appointment date: 08/28/2023    Appointment time: 3:00 PM      Outpatient Cardiac Rehabilitation Instructions    Your physician has referred you to participated in outpatient cardiac rehabilitation. Contact [facility] Department at [phone] to schedule an appointment.    If you will be attending cardiac rehab at a different facility, a referral will be sent to the facility of your choice.  If you have not heard from that facility within 2 weeks after you have been discharged, please call them to schedule an appointment.    Referral to outpatient cardiac rehab:    Outside referral faxed:    Internal referral sent to:       Heart Failure Information    You  are at risk for readmission to the hospital because of fluid overload. There are steps you can take to lower your risk:  *Continue your current heart medications. Changes made have been made during your hospital stay.  *Remember to weigh yourself every morning, first thing after you urinate, and write down your weigh. Take this record to your doctor's appointments.  *Chart your symptoms - fatigue, shortness of breath, swelling, etc. Immediately report any worsening.  *Remember to consume no more than 2,000mg  (milligrams) of sodium daily. Watch out for packaged, processed, canned, and restaurant foods especially.  *If any of your symptoms worsen, or if you gain more than 2 pounds in 24 hours, or 5 pounds in a week, call your cardiologist immediately. EARLY REPORTING OF THESE CHANGES IS VERY IMPORTANT.  Condition Code     Risk Reduction Plan    Cardiac Event Personal Risk Factor Reduction Plan  **Take this sheet to your physician to show treatment recommendations**  Robert Mercado  Admission Date: (Not on file) LOS: @LOS @       Blood Pressure Risk  Goal: Keep blood pressure below 130/80  Your Numbers:  BP Readings from Last 1 Encounters:  08/03/23 : 129/59     Plan: High blood pressure is the single most important risk factor for cardiac events because it's the #1 cause of cardiac events.  Take medication as prescribed and monitor your blood pressure.    Abnormal lipids (fats in blood) Risk   Goal: Total Cholesterol: <200  LDL (bad cholesterol): primary prevention <100   LDL (bad cholesterol): secondary prevention < 70  HDL (good cholesterol): >40 for men, >50 for women  Triglycerides: <150  Your Numbers: Cholesterol       Date                     Value               Ref Range           Status                07/29/2022               117                 <200 MG/DL          Final            ----------  LDL       Date                     Value               Ref Range           Status                07/29/2022               61                  <100 mg/dL          Final            ----------  HDL       Date                     Value               Ref Range  Status                07/29/2022               43 >40 MG/DL           Final            ----------  Triglycerides       Date                     Value               Ref Range           Status                07/29/2022               74                  <150 MG/DL          Final            ----------  Plan: Diets high in saturated fat, trans fat and cholesterol can raise blood cholesterol levels increasing your risk of having a cardiac event.  Take medication as prescribed and eat a heart healthy, low sodium diet.    Smoking Risk   Goals: If you smoke, STOP!   Plan: Smoking DOUBLES your risk of having a cardiac event. Quitting can greatly reduce your risk. To register for smoking cessation program call 8546841844 or visit www.smokefree.gov    Diabetes Risk   Goal: Non-diabetic: Below 5.7%  Goal for diabetic: Less than 7%  Your Numbers: No results found for: HGBA1C  Plan: If you have diabetes, even if treated, you are at an increased risk of having a cardiac event.     Alcohol Use Risk  Goal: Alcohol use can lead to a cardiac event.  Plan: For men, limit intake to no more than 2 drinks per day.  For women, limit intake to no more than one drink per day.    Weight Management Risk   Goal: Healthy: BMI is 18.5 to 24.9  Overweight: BMI is 25 to 29.9  Obese: BMI is 30 or higher  Morbid Obesity: BMI [...]  Your Numbers: Your    Plan: If you have questions about your diet after you go home, you can call a dietitian at [phone]    Physical Activity Risk   Goal: Patients should have approval by a physician prior to beginning an exercise program.  Plan: Try to get at least 30 minutes of moderate physical activity five days a week or 20 minutes of vigorous physical activity three days a week with your doctor's approval.    To the Neurology and the NeuroSurg Discharge order sets set add a new order in the education section titled Risk Reduction Plan, in the comments section add the following (default select this new order for neuro and neuro surg and ensure this information is added to the AVS).     Additional Discharge Instructions    -You MUST take antibiotics prior to any dental procedures (including cleaning), invasive respiratory tract procedures and invasive skin procedures.  You should NOT have any of these procedure for 3 months after your valve surgery.  This will help to prevent infection to the heart valve.  Contact your primary care provider or cardiologist for an antibiotic prescription when needed.    -You have been prescribed Over-the-Counter medications.  These  medications are very important for you to pick-up at the pharmacy and take them as directed.     Questions About Your Stay    For questions or concerns regarding your hospital stay, call [phone] during regular business hours, Monday through Friday, 8am-4pm.  During the evenings or weekends, please call [phone].     Discharging attending physician: ZORN, GEORGE L III [[id number]]      Discharge Comments    Resume previous coumadin regiment and have INR drawn on Monday or Tuesday     Appointment Request: Cardiac Rehab    PT TO START CARDIAC REHAB 1 WEEK AFTER DISCHARGE  Ordering of Outpatient Cardiac Rehabilitation includes:   - Outpatient Cardiac Rehabilitation per department protocol, 2-3x per week for up to a total of 36 sessions   - Initial evaluation including or baseline exercise testing  - Psychosocial, fitness, nutritional, medication, and risk factor assessment and education  - Development of individualized treatment plans (ITPs)  - If patient is diabetic, blood glucose testing as needed per departmental protocol    o Blood sugar monitoring and provision of 3 glucose tablets or ? 15 g carbohydrate snack for hypoglycemia based on   departmental protocol   - Initiation of all emergency protocols, as indicated per hospital and departmental policies, including, but not limited to    o Administration of nitroglycerin .4mg  sublingual for persistent, unstable, new-onset, or stable angina that doses not resolve  after 5 minutes of rest.  May repeat up to a total of 3 doses per protocol.    o Obtaining a 12-Lead EKG if indicated.    o Initiation and/or titration of O2 per departmental protocol for oxygen saturation <88% via pulse oximeter.   - Upon completion of the Cardiac Rehab program criteria, patient may continue into a maintenance program     Diagnosis Information: Valve Replacement    Pager # of the requesting provider if any questions? [phone]       Current Discharge Medication List         START taking these medications    Details   acetaminophen (TYLENOL EXTRA STRENGTH) 500 mg tablet Take one tablet to two tablets by mouth every 6 hours as needed. Indications: pain    PRESCRIPTION TYPE:  OTC            CONTINUE these medications which have NOT CHANGED    Details   atorvastatin (LIPITOR) 80 mg tablet Take one tablet by mouth daily.  Qty: 90 tablet, Refills: 3    PRESCRIPTION TYPE:  Normal      bumetanide (BUMEX) 2 mg tablet Take one tablet by mouth twice daily. Indications: accumulation of fluid resulting from chronic heart failure  Qty: 180 tablet, Refills: 3    PRESCRIPTION TYPE:  Normal  Associated Diagnoses: Bilateral edema of lower extremity; Chronic combined systolic and diastolic heart failure; Persistent atrial fibrillation (HCC)      clopiDOGreL (PLAVIX) 75 mg tablet Take one tablet by mouth daily.  Qty: 90 tablet, Refills: 3    PRESCRIPTION TYPE:  Normal      coenzyme Q10 200 mg capsule Take one capsule by mouth at bedtime daily.    PRESCRIPTION TYPE:  Historical Med      dilTIAZem CD (CARDIZEM CD) 180 mg capsule Take one capsule by mouth twice daily. Indications: paroxysmal supraventricular tachycardia  Qty: 180 capsule, Refills: 3    PRESCRIPTION TYPE:  Normal      diphenhydrAMINE HCL (BENADRYL) 25 mg capsule Take one  capsule by mouth every 6 hours as needed. Indications: hives  Qty: 30 capsule, Refills: 0    PRESCRIPTION TYPE:  Normal      dronedarone (MULTAQ) 400 mg tablet Take one tablet by mouth twice daily with meals.  Qty: 180 tablet, Refills: 1    PRESCRIPTION TYPE:  Normal      ferrous sulfate (FEOSOL) 325 mg (65 mg iron) tablet Take one tablet by mouth daily. Take on an empty stomach at least 1 hour before or 2 hours after food.    PRESCRIPTION TYPE:  Historical Med      finasteride (PROSCAR) 5 mg tablet Take one tablet by mouth at bedtime daily.    PRESCRIPTION TYPE:  Historical Med      fluticasone (FLONASE) 50 mcg/actuation nasal spray Apply two sprays to each nostril as directed as Needed. Shake bottle gently before using.    PRESCRIPTION TYPE:  Historical Med      gabapentin (NEURONTIN) 300 mg capsule Take three capsules by mouth three times daily.    PRESCRIPTION TYPE:  Historical Med      HYDROcodone/acetaminophen (NORCO) 10/325 mg tablet Take one tablet by mouth three times daily as needed for Pain.    PRESCRIPTION TYPE:  Historical Med      isosorbide mononitrate ER (IMDUR) 30 mg tablet,extended release 24 hr Take one tablet by mouth every morning.  Qty: 90 tablet, Refills: 3    PRESCRIPTION TYPE:  Normal      MELATONIN PO Take 12 mg by mouth at bedtime daily.    PRESCRIPTION TYPE:  Historical Med      metoprolol succinate XL (TOPROL XL) 25 mg extended release tablet Take one tablet by mouth twice daily. Indications: high blood pressure  Qty: 180 tablet, Refills: 3    PRESCRIPTION TYPE:  Normal      nitroglycerin (NITROSTAT) 0.4 mg tablet Take one tablet sublingually as needed every 5 min for chest pain X three  Qty: 25 tablet, Refills: 6    PRESCRIPTION TYPE:  Normal  Comments: Take one tablet sublingually as needed every 5 min for chest pain X three      pantoprazole DR (PROTONIX) 40 mg tablet Take one tablet by mouth daily.    PRESCRIPTION TYPE:  Historical Med      polyethylene glycol 3350 (MIRALAX) 17 gram/dose powder Take seventeen g by mouth daily as needed for Constipation.    PRESCRIPTION TYPE:  Historical Med      potassium chloride SR (KLOR-CON M20) 20 mEq tablet Take two tablets by mouth daily.  Qty: 90 tablet, Refills: 3    PRESCRIPTION TYPE:  Normal      vit A/vit C/vit E/zinc/copper (PRESERVISION AREDS PO) Take 1 tablet by mouth twice daily.    PRESCRIPTION TYPE:  Historical Med      !! warfarin (COUMADIN) 1 mg tablet Take 1-2 tablets by mouth in combination with 4 mg tablets as directed by PCP  Qty: 180 tablet, Refills: 3    PRESCRIPTION TYPE:  Normal      !! warfarin (COUMADIN) 4 mg tablet Take one tablet by mouth daily. **DO NOT TAKE THIS UNTIL INDICATED BASED ON INR RESULTS**  Qty: 90 tablet    PRESCRIPTION TYPE:  No Print       !! - Potential duplicate medications found. Please discuss with provider.           Scheduled appointments:      Aug 28, 2023 3:00 PM  Valve Return with Janelle L  McClymont, APRN-NP  Cardiovascular Medicine: [facility] (CVM Exam) [address]  [address], [address]  [place]  [place] [address]  [phone]     Oct 09, 2023 2:45 PM  Echo Doppler with [facility] SPECIAL [facility]  Cardiovascular Medicine: [facility] (CVM Procedural) [address]  [address]  [place]  [place] [address]  [phone]     Oct 09, 2023 4:00 PM  Valve Return with Delon Stephane Piety, APRN-NP  Cardiovascular Medicine: [facility] (CVM Exam) [address]  [address], [address]  [place]  [place] [address]  [phone]     Nov 17, 2023 2:00 PM  Office visit with Marina  LOISE Darter, MD  Cardiovascular Medicine: [facility] (CVM Exam) [address]  [address], [address]  [place] [address]  [phone]     Feb 08, 2024 9:30 AM  (Arrive by 9:00 AM)  CT ABDOMEN W/WO CONTRAST with [facility]  Imaging, CT: [facility]) [address] [facility] [address].  [address], [address]  [place] [address]  [phone]     Feb 08, 2024 10:45 AM  (Arrive by 10:30 AM)  Office visit with Sherwood MARLA Ruth, MD  Oncology: [facility] Exam) [address] [facility].  [address]  [place] [address]  [phone]            Signed:  Ozell JINNY Manning, PA-C  08/21/2023      cc:  Primary Care Physician:  Eward Best     Referring physicians:  Darter Marina  LOISE, MD   Additional provider(s):        Ref[facility] to outpatient cardiac rehab: [facility]    [phone]  Outside referral faxed: Date Faxed: 08/20/23  Internal referral sent to:

## 2023-08-17 NOTE — Unmapped
[facility] Anesthesia Pre-Procedure Evaluation    Name: Robert Mercado      MRN: [id number]     DOB: [birth year]     Age: 77 y.o.     Sex: male   _________________________________________________________________________     Procedure Info:   Procedure Information       Date/Time: 08/20/23 1027    Procedure: Transcatheter Aortic Valve Replacement - Femoral Artery - Edwards Sapien 29 S3 + 3 ccs. Either common femoral artery for access.    Location: HC2 CV LAB 8 / HC2 EP LAB    Providers: Arby Barrette, MD            Physical Assessment  Vital Signs (last filed in past 24 hours):  BP: 112/64 (09/30 1014)  Temp: 36.4 ?C (97.6 ?F) (09/30 1012)  Pulse: 61 (09/30 1012)  Respirations: 16 PER MINUTE (09/30 1012)  SpO2: 99 % (09/30 1012)  O2 Device: None (Room air) (09/30 1012)  Height: 182.9 cm (6') (09/30 1012)  Weight: 125.6 kg (277 lb) (09/30 1012)      Patient History   Allergies   Allergen Reactions    Amiodarone Hcl DIARRHEA, HIVES, ITCHING and REDNESS    Duloxetine HIVES    Lyrica [Pregabalin] RASH    Demerol (Pf) [Meperidine (Pf)] SEE COMMENTS     Hyperactivity    Tuberculin Ppd Tine Test SEE COMMENTS     Positive Skin Test - patient's TB test ends up creating a large swollen spot at site of test        Current Medications    Medication Directions   atorvastatin (LIPITOR) 80 mg tablet Take one tablet by mouth daily.  Patient taking differently: Take one tablet by mouth at bedtime daily.   bumetanide (BUMEX) 2 mg tablet Take one tablet by mouth twice daily. Indications: accumulation of fluid resulting from chronic heart failure  Patient taking differently: Take one tablet by mouth daily as needed. Indications: accumulation of fluid resulting from chronic heart failure   clopiDOGreL (PLAVIX) 75 mg tablet Take one tablet by mouth daily.   coenzyme Q10 200 mg capsule Take one capsule by mouth at bedtime daily.   dilTIAZem CD (CARDIZEM CD) 180 mg capsule Take one capsule by mouth twice daily. Indications: paroxysmal supraventricular tachycardia   diphenhydrAMINE HCL (BENADRYL) 25 mg capsule Take one capsule by mouth every 6 hours as needed. Indications: hives  Patient taking differently: Take one capsule by mouth at bedtime as needed. Indications: hives   dronedarone (MULTAQ) 400 mg tablet Take one tablet by mouth twice daily with meals.   ferrous sulfate (FEOSOL) 325 mg (65 mg iron) tablet Take one tablet by mouth daily. Take on an empty stomach at least 1 hour before or 2 hours after food.   finasteride (PROSCAR) 5 mg tablet Take one tablet by mouth at bedtime daily.   fluticasone (FLONASE) 50 mcg/actuation nasal spray Apply two sprays to each nostril as directed as Needed. Shake bottle gently before using.   gabapentin (NEURONTIN) 300 mg capsule Take three capsules by mouth three times daily.   HYDROcodone/acetaminophen (NORCO) 10/325 mg tablet Take one tablet by mouth three times daily as needed for Pain.   isosorbide mononitrate ER (IMDUR) 30 mg tablet,extended release 24 hr Take one tablet by mouth every morning.   MELATONIN PO Take 12 mg by mouth at bedtime daily.   metoprolol succinate XL (TOPROL XL) 25 mg extended release tablet Take one tablet by mouth twice daily. Indications: high blood  pressure   nitroglycerin (NITROSTAT) 0.4 mg tablet Take one tablet sublingually as needed every 5 min for chest pain X three   pantoprazole DR (PROTONIX) 40 mg tablet Take one tablet by mouth daily.   polyethylene glycol 3350 (MIRALAX) 17 gram/dose powder Take seventeen g by mouth daily as needed for Constipation.   potassium chloride SR (KLOR-CON M20) 20 mEq tablet Take two tablets by mouth daily.  Patient taking differently: Take two tablets by mouth daily as needed.   vit A/vit C/vit E/zinc/copper (PRESERVISION AREDS PO) Take 1 tablet by mouth twice daily.   warfarin (COUMADIN) 1 mg tablet Take 1-2 tablets by mouth in combination with 4 mg tablets as directed by PCP  Patient taking differently: Take one tablet by mouth on Sunday and Thursday with one-half (2mg ) of a 4mg  tablet. Total dose on Sunday and Thursday 3mg .   warfarin (COUMADIN) 4 mg tablet Take one tablet by mouth daily. **DO NOT TAKE THIS UNTIL INDICATED BASED ON INR RESULTS**  Patient taking differently: Take one-half tablet by mouth daily. Takes 2mg  Monday, Tuesday, Wednesday, Friday and Saturday. Takes 3mg  on Sunday and Thursday.       Review of Systems/Medical History        PONV Screening: Non-smoker    No history of anesthetic complications (pt reports hyperactivity with demerol)      Airway         TMJ (hx locking)      Pulmonary             Obstructive Sleep Apnea          Interventions: CPAP; noncompliant      Pt reports chronic, intermittent cough r/t post-nasal drip      Cardiovascular       Recent diagnostic studies:          ECG and echocardiogram      Exercise tolerance: <4 METS (limited by DOE and hip pain)       Beta Blocker therapy: Yes        CIED: pacemaker          Manufacture: [employer]              Device Indication(s): SSS          Pacemaker Dependent                    CIED interrogated last 12 months          Device settings:  AAIR and DDDR              Battery life > 1 year              Magnet response: Asynchronous mode and 100 bpm                Hypertension, well controlled          Valvular problems/murmurs:  MR      Aortic Stenosis: severe        Past MI (12/2022), old (> 6 months)    Coronary artery disease        PTCA (PCI to LAD, diagonal, and ramus; 2 stents)            Dysrhythmias (SSS; PVCs s/p ablation); atrial fibrillation    No angina      CHF Chronic, Systolic and Diastolic      Hyperlipidemia      No orthopnea (sleeps in recliner d/t hip pain)      Dyspnea  on exertion      No syncope (pre-syncope)      Pulmonary hypertension      GI/Hepatic/Renal             GERD, well controlled         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)           Renal mass- surveillance      Neuro/Psych         No chronic opioid use (occasional hydrocodone, last use months ago)      Musculoskeletal         Back pain      Arthritis (R):  osteo        Endocrine/Other           History of blood transfusion (no reaction)        Malignancy (skin s/p excision):      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    Abdominal Findings:       Obese    Neurological Findings:       Alert and oriented x 3    Constitutional findings:       No acute distress    Other Findings: Skin under pannus: pink with distinct borders, nothing open or draining       Previous Airway Procedure Notes Displaying the 3 most recent records   No records found.         Diagnostic Tests  Hematology:   Lab Results   Component Value Date    HGB 11.2 08/17/2023    HCT 32.8 08/17/2023    PLTCT 132 08/17/2023    WBC 5.2 08/17/2023    NEUT 67 08/03/2022    ANC 4.26 08/03/2022    ALC 1.09 08/03/2022    MONA 7 08/03/2022    AMC 0.47 08/03/2022    EOSA 8 08/03/2022    ABC 0.04 08/03/2022    MCV 87.2 08/17/2023    MCH 29.8 08/17/2023    MCHC 34.1 08/17/2023    MPV 7.1 08/17/2023    RDW 16.7 08/17/2023         General Chemistry:   Lab Results   Component Value Date    NA 138 05/11/2023    K 3.8 05/11/2023    CL 107 05/11/2023    CO2 18.0 05/11/2023    GAP 13 05/11/2023    BUN 26.0 05/11/2023    CR 1.8 07/28/2023    CR 1.22 05/11/2023    GLU 102 05/11/2023    GLU 76 05/13/2010    CA 9.2 05/11/2023    ALBUMIN 3.4 05/11/2023    MG 2.1 05/11/2023    TOTBILI 0.40 05/11/2023      Coagulation:  Lab Results   Component Value Date    PT 38.8 08/03/2022    INR 2.5 08/06/2023    INR 2.1 07/14/2023     PFTs 9/10:  Normal.  FEV1 3.55 (131%), DLCO 20.75 (87%)     CTA 9/10:  IMPRESSION   CHEST:   1. Aortic valve calcification with mild dilatation of the ascending   thoracic aorta, measuring up to 4.7 cm.   2.  Cardiomegaly with coronary artery calcification.   3.  Subcentimeter right lower lobe pulmonary nodule, which is   indeterminate. Short-term follow-up CT chest in 3-6 months is recommended.   4.  No thoracic lymphadenopathy.     ABDOMEN AND PELVIS:   1.  Normal caliber abdominal aorta and iliac arteries with moderate   calcific atherosclerotic plaque.   2.  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 Additional small right renal lesion, which is indeterminate and   may represent an additional small tumor or hemorrhagic cyst. Urologic   consultation is recommended.   3.  No abdominopelvic lymphadenopathy.     Echo 05/27/23    The left ventricular systolic function is low normal. The visually estimated ejection fraction is 50%. There are no segmental wall motion abnormalities. Abnormal septal motion consistent with right ventricular pacing.    The right ventricle is moderately dilated. The right ventricular systolic function is normal. Pacemaker lead present in the ventricle.    There is severe mitral annular calcification without stenosis, no stenosis, moderate regurgitation.    Moderate tricuspid valve regurgitation.    Severely sclerotic aortic valve, likely severe stenosis (MG ~ 25  mmHg, peak velocity= 3.4 m/sec, DI= 0.20 , AVA=  0.84 cm?), moderate  regurgitation.  Spectral Doppler data and 2D echo eval of the aortic valve revealed that this is very likely low normal flow -- low gradient severe AS.    The ascending aorta is mildly dilated. The sinuses of Valsalva are mildly dilated.    Mild pulmonary hypertension, estimated PAP = 37 mmHg.  At     Compared to the previous study dated 07/30/2022: LVEF 50-55%, heavily calcified aortic valve, MG = 13 mmHg, moderate AI, moderate TR, mild MR, estimated PAP = 32 mmHg.      EKG 07/29/23  Atrial-paced with prolonged AV conduction, rate 78  RBBB  Left anterior fascicular block    PAC Plan    Interview: Clinic Interview    ASA Score: 4    Anesthesia Options Discussed: General                      Pacemaker Operative Plan:    Teams message sent to CTS liaison re: intertrigo. They are ok proceeding since it is isolated to that area.              CIED  Pacemaker Dependent         Aortic Stenosis: severe

## 2023-08-17 NOTE — Progress Notes
Pre-op education appt complete with pt, his daughter will be w/ pt on DOS.  Informed of current visitor policy.  Reviewed and provided written pre-op instructions.  Instructed pt to be NPO @ 2300 the night before surgery, except for a sip of water to take any meds that the [facility] pharmacist instructs pt to take on the morning of surgery.  Instructed to cont taking Plavix as prescribed, including on the morning of surgery; verified that patient took LD of Warfarin on 9/27, as instructed.  As per written instructions, pt to take 2 preop showers w/ provided CHG 4% soap prior to coming to the hospital for surgery. Instructed to check in at [facility] Admissions at 0645 on DOS.  Discussed process for pre-op, intra-op and post-op recovery.  Discussed post-op pain; lifting and driving restrictions; importance of mobilization and s/s of infection after discharge.  Instructed to call the CTS office if having any health changes prior to surgery.  Pt verbalized understanding of all instructions.  Denies dental issues; pt denies any rash or skin irritation to bilat groins-states he has a chronic rash under panus that he self treats with powder; denies s/s UTI. KCCQ complete - see flowsheet.  60M walks complete - see additional note.   All questions answered to the pt's satisfaction.  Escorted to [facility] Admissions for Pre-registration and then to [facility]. Preop labs to be collected by [facility].   AF

## 2023-08-18 ENCOUNTER — Encounter: Admit: 2023-08-18 | Discharge: 2023-08-18 | Payer: MEDICARE

## 2023-08-18 NOTE — Telephone Encounter
Per Arlean Hopping, NP, pt to take Bumex today and tomorrow AM in preparation for TAVR.  Pt has been taking Bumex on PRN basis.  Reviewed presence of intertrigo to abdominal panus w/ Dr Theresia Lo; pt OK to proceed with TAVR as planned on 10/3.  Pt to arrive at 0645 for admission on 10/3. Pt  verbalized understanding of plan.

## 2023-08-19 ENCOUNTER — Encounter: Admit: 2023-08-19 | Discharge: 2023-08-19 | Payer: MEDICARE

## 2023-08-19 NOTE — Progress Notes
TAVR Evaluation    Date of initial eval: 9/10  Name: Robert Mercado  DOB: 1946/03/21  MRN: [id number]  Primary provider: Dwaine Gale  Referring cardiologist: Avie Arenas    Cardiac Surgeon eval: TZ  Interventional Cardiologist eval: MW    Assessment & Plan:   Severe symptomatic aortic stenosis. TAVR work up complete and results reviewed.  TAVR scheduled 10/3 with Dr. Helen Hashimoto and Dr. Paris Lore.      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, PVCs status post ablation and obstructive sleep apnea     Chronic combined systolic and diastolic heart failure, NYHA class II    BMI: 37.6  Allergies:   Allergies   Allergen Reactions    Amiodarone Hcl DIARRHEA, HIVES, ITCHING and REDNESS    Duloxetine HIVES    Lyrica [Pregabalin] RASH    Demerol (Pf) [Meperidine (Pf)] SEE COMMENTS     Hyperactivity    Tuberculin Ppd Tine Test SEE COMMENTS     Positive Skin Test - patient's TB test ends up creating a large swollen spot at site of test       STS: 4.73%  Frailty:     5 Meter walks complete:      1st Trial = 11.98 seconds  2nd Trial = 12.01 seconds  3rd Trial = 12.11 seconds     KCCQ: 18.75    Lab: Cr 1.66  Creat cl: 63.76    EKG:   Atrial-paced rhythm with prolonged AV conduction  Right bundle branch block  Left anterior fascicular block    Echo: 7/10    The left ventricular systolic function is low normal. The visually estimated ejection fraction is 50%. There are no segmental wall motion abnormalities. Abnormal septal motion consistent with right ventricular pacing.    The right ventricle is moderately dilated. The right ventricular systolic function is normal. Pacemaker lead present in the ventricle.    There is severe mitral annular calcification without stenosis, no stenosis, moderate regurgitation.    Moderate tricuspid valve regurgitation.    Severely sclerotic aortic valve, likely severe stenosis (MG ~ 25  mmHg, peak velocity= 3.4 m/sec, DI= 0.20 , AVA=  0.84 cm?), moderate  regurgitation.  Spectral Doppler data and 2D echo eval of the aortic valve revealed that this is very likely low normal flow -- low gradient severe AS.    The ascending aorta is mildly dilated. The sinuses of Valsalva are mildly dilated.    Mild pulmonary hypertension, estimated PAP = 37 mmHg.  At    Cath: 2/23  Unsuccessful PCI of the subdivision of the ramus intermedius secondary to difficulty with access and inability to adequately cannulate the left main trunk    RECOMMENDATIONS    The patient will be treated with medical therapy and risk factor modification.  If he has recurrent angina unresponsive to medical therapy, then another attempt at PCI can be made via the left femoral artery.     2/29:Successful PTCA/DES of ramus intermedius with reduction in stenosis from 90% to 0%.     Carotid duplex: 9/5    0-49% stenosis bilateral carotids     PFTs 9/10:  Normal.  FEV1 3.55 (131%), DLCO 20.75 (87%)     CTA 9/10:  IMPRESSION  CHEST:   1. Aortic valve calcification with mild dilatation of the ascending   thoracic aorta, measuring up to 4.7 cm.   2.  Cardiomegaly with coronary artery calcification.   3.  Subcentimeter right lower lobe pulmonary nodule, which is   indeterminate. Short-term follow-up CT chest in 3-6 months is recommended.   4.  No thoracic lymphadenopathy.     ABDOMEN AND PELVIS:   1.  Normal caliber abdominal aorta and iliac arteries with moderate   calcific atherosclerotic plaque.   2.  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 Additional small right renal lesion, which is indeterminate and   may represent an additional small tumor or hemorrhagic cyst. Urologic   consultation is recommended.   3.  No abdominopelvic lymphadenopathy.     report:   Angle 70 , LAO 27 CAU 8  Overlap LAO 16 CAU 35  Very angulated implant  29 S3 + 3 ccs  Access tortuous but ok either side

## 2023-08-19 NOTE — Telephone Encounter
Pt instructed to arrive for check-in at [facility] admissions at 0545 for surgery tomorrow, 10/3. Pt v/u. All questions answered.

## 2023-08-20 ENCOUNTER — Inpatient Hospital Stay: Admit: 2023-08-20 | Discharge: 2023-08-20 | Payer: MEDICARE

## 2023-08-20 ENCOUNTER — Encounter: Admit: 2023-08-20 | Discharge: 2023-08-20 | Payer: MEDICARE

## 2023-08-20 DIAGNOSIS — Z9861 Coronary angioplasty status: Secondary | ICD-10-CM

## 2023-08-20 DIAGNOSIS — M549 Dorsalgia, unspecified: Secondary | ICD-10-CM

## 2023-08-20 DIAGNOSIS — I499 Cardiac arrhythmia, unspecified: Secondary | ICD-10-CM

## 2023-08-20 DIAGNOSIS — G4730 Sleep apnea, unspecified: Secondary | ICD-10-CM

## 2023-08-20 DIAGNOSIS — R0609 Other forms of dyspnea: Secondary | ICD-10-CM

## 2023-08-20 DIAGNOSIS — I272 Pulmonary hypertension, unspecified: Secondary | ICD-10-CM

## 2023-08-20 DIAGNOSIS — I219 Acute myocardial infarction, unspecified: Secondary | ICD-10-CM

## 2023-08-20 DIAGNOSIS — Z7901 Long term (current) use of anticoagulants: Secondary | ICD-10-CM

## 2023-08-20 DIAGNOSIS — S0300XA Dislocation of jaw, unspecified side, initial encounter: Secondary | ICD-10-CM

## 2023-08-20 DIAGNOSIS — I480 Paroxysmal atrial fibrillation: Secondary | ICD-10-CM

## 2023-08-20 DIAGNOSIS — I10 Essential (primary) hypertension: Secondary | ICD-10-CM

## 2023-08-20 DIAGNOSIS — M1990 Unspecified osteoarthritis, unspecified site: Secondary | ICD-10-CM

## 2023-08-20 DIAGNOSIS — Z950 Presence of cardiac pacemaker: Secondary | ICD-10-CM

## 2023-08-20 DIAGNOSIS — A4902 Methicillin resistant Staphylococcus aureus infection, unspecified site: Secondary | ICD-10-CM

## 2023-08-20 DIAGNOSIS — I495 Sick sinus syndrome: Secondary | ICD-10-CM

## 2023-08-20 DIAGNOSIS — I340 Nonrheumatic mitral (valve) insufficiency: Secondary | ICD-10-CM

## 2023-08-20 DIAGNOSIS — I4891 Unspecified atrial fibrillation: Secondary | ICD-10-CM

## 2023-08-20 DIAGNOSIS — I2510 Atherosclerotic heart disease of native coronary artery without angina pectoris: Secondary | ICD-10-CM

## 2023-08-20 DIAGNOSIS — K219 Gastro-esophageal reflux disease without esophagitis: Secondary | ICD-10-CM

## 2023-08-20 DIAGNOSIS — I509 Heart failure, unspecified: Secondary | ICD-10-CM

## 2023-08-20 DIAGNOSIS — N183 CKD (chronic kidney disease) stage 3, GFR 30-59 ml/min (HCC): Secondary | ICD-10-CM

## 2023-08-20 DIAGNOSIS — R001 Bradycardia, unspecified: Secondary | ICD-10-CM

## 2023-08-20 DIAGNOSIS — G629 Polyneuropathy, unspecified: Secondary | ICD-10-CM

## 2023-08-20 DIAGNOSIS — E785 Hyperlipidemia, unspecified: Secondary | ICD-10-CM

## 2023-08-20 DIAGNOSIS — C4491 Basal cell carcinoma of skin, unspecified: Secondary | ICD-10-CM

## 2023-08-20 DIAGNOSIS — E669 Obesity, unspecified: Secondary | ICD-10-CM

## 2023-08-20 DIAGNOSIS — I350 Nonrheumatic aortic (valve) stenosis: Secondary | ICD-10-CM

## 2023-08-20 LAB — CBC
HEMATOCRIT: 31 % — ABNORMAL LOW (ref 40–50)
RBC COUNT: 3.5 M/UL — ABNORMAL LOW (ref 4.4–5.5)
WBC COUNT: 4.9 10*3/uL (ref 4.5–11.0)

## 2023-08-20 LAB — DEVICE EVALUATION - PPM (PERMANENT PACEMAKER): EP RV LEAD MODEL#: 508 degrees

## 2023-08-20 LAB — BASIC METABOLIC PANEL: POTASSIUM: 3.5 MMOL/L — ABNORMAL LOW (ref 3.5–5.1)

## 2023-08-20 LAB — PROTIME INR (PT): INR: 1.3 s — ABNORMAL HIGH (ref 0.9–1.2)

## 2023-08-20 LAB — CONFIRMATION BLOOD TYPE

## 2023-08-20 LAB — ECG 12-LEAD
P-R INTERVAL: 178 ms — ABNORMAL HIGH (ref 32.0–36.0)
Q-T INTERVAL: 558 ms — ABNORMAL LOW (ref 150–400)
QRS DURATION: 210 ms — ABNORMAL HIGH (ref 11–15)
QTC CALCULATION (BAZETT): 566 ms — ABNORMAL LOW (ref 7–11)
VENTRICULAR RATE: 62 {beats}/min (ref 26–34)

## 2023-08-20 LAB — POC ACTIVATED CLOTTING TIME (ISTAT): POCT ACT ISTAT: 256 s

## 2023-08-20 MED ORDER — PANTOPRAZOLE 40 MG PO TBEC
40 mg | Freq: Every day | ORAL | 0 refills | Status: DC
Start: 2023-08-20 — End: 2023-08-21

## 2023-08-20 MED ORDER — ATORVASTATIN 40 MG PO TAB
80 mg | Freq: Every evening | ORAL | 0 refills | Status: DC
Start: 2023-08-20 — End: 2023-08-21
  Administered 2023-08-21: 01:00:00 80 mg via ORAL

## 2023-08-20 MED ORDER — GUAIFENESIN 600 MG PO TA12
600 mg | Freq: Two times a day (BID) | ORAL | 0 refills | Status: DC
Start: 2023-08-20 — End: 2023-08-21
  Administered 2023-08-21: 04:00:00 600 mg via ORAL

## 2023-08-20 MED ORDER — CLOPIDOGREL 75 MG PO TAB
75 mg | Freq: Every day | ORAL | 0 refills | Status: DC
Start: 2023-08-20 — End: 2023-08-21

## 2023-08-20 MED ORDER — ALBUTEROL SULFATE 90 MCG/ACTUATION IN HFAA
2 | RESPIRATORY_TRACT | 0 refills | Status: DC | PRN
Start: 2023-08-20 — End: 2023-08-21

## 2023-08-20 MED ORDER — NOREPINEPHRINE IV DRIP D5W STD CONC (AM)(OR)
INTRAVENOUS | 0 refills | Status: DC
Start: 2023-08-20 — End: 2023-08-20
  Administered 2023-08-20: 13:00:00 .04 ug/kg/min via INTRAVENOUS

## 2023-08-20 MED ORDER — FLUTICASONE PROPIONATE 50 MCG/ACTUATION NA SPSN
2 | NASAL | 0 refills | Status: DC | PRN
Start: 2023-08-20 — End: 2023-08-21

## 2023-08-20 MED ORDER — MAGNESIUM OXIDE 400 MG (241.3 MG MAGNESIUM) PO TAB
200-600 mg | ORAL | 0 refills | Status: DC | PRN
Start: 2023-08-20 — End: 2023-08-21

## 2023-08-20 MED ORDER — FENTANYL CITRATE (PF) 50 MCG/ML IJ SOLN
INTRAVENOUS | 0 refills | Status: DC
Start: 2023-08-20 — End: 2023-08-20

## 2023-08-20 MED ORDER — SODIUM CHLORIDE 0.9 % IV SOLP
INTRAVENOUS | 0 refills | Status: AC
Start: 2023-08-20 — End: ?

## 2023-08-20 MED ORDER — PHENYLEPHRINE 40 MCG/ML IN NS IV DRIP (STD CONC)
INTRAVENOUS | 0 refills | Status: DC
Start: 2023-08-20 — End: 2023-08-20
  Administered 2023-08-20 (×2): .5 ug/kg/min via INTRAVENOUS

## 2023-08-20 MED ORDER — MAGNESIUM SULFATE IN D5W 1 GRAM/100 ML IV PGBK
1 g | INTRAVENOUS | 0 refills | Status: DC | PRN
Start: 2023-08-20 — End: 2023-08-21

## 2023-08-20 MED ORDER — ONDANSETRON HCL (PF) 4 MG/2 ML IJ SOLN
4 mg | INTRAVENOUS | 0 refills | Status: DC | PRN
Start: 2023-08-20 — End: 2023-08-21

## 2023-08-20 MED ORDER — LIDOCAINE (PF) 10 MG/ML (1 %) IJ SOLN
Freq: Once | SUBCUTANEOUS | 0 refills | Status: CP | PRN
Start: 2023-08-20 — End: ?

## 2023-08-20 MED ORDER — WARFARIN 3 MG PO TAB
3 mg | ORAL | 0 refills | Status: DC
Start: 2023-08-20 — End: 2023-08-21
  Administered 2023-08-21: 01:00:00 3 mg via ORAL

## 2023-08-20 MED ORDER — ONDANSETRON HCL (PF) 4 MG/2 ML IJ SOLN
INTRAVENOUS | 0 refills | Status: DC
Start: 2023-08-20 — End: 2023-08-20

## 2023-08-20 MED ORDER — PROCHLORPERAZINE 25 MG RE SUPP
25 mg | RECTAL | 0 refills | Status: DC | PRN
Start: 2023-08-20 — End: 2023-08-21

## 2023-08-20 MED ORDER — ARTIFICIAL TEARS (PF) SINGLE DOSE DROPS GROUP
OPHTHALMIC | 0 refills | Status: DC
Start: 2023-08-20 — End: 2023-08-20

## 2023-08-20 MED ORDER — METOPROLOL SUCCINATE 25 MG PO TB24
25 mg | Freq: Every day | ORAL | 0 refills | Status: DC
Start: 2023-08-20 — End: 2023-08-21

## 2023-08-20 MED ORDER — ROCURONIUM 10 MG/ML IV SOLN
INTRAVENOUS | 0 refills | Status: DC
Start: 2023-08-20 — End: 2023-08-20

## 2023-08-20 MED ORDER — FENTANYL CITRATE (PF) 50 MCG/ML IJ SOLN
25 ug | INTRAVENOUS | 0 refills | Status: DC | PRN
Start: 2023-08-20 — End: 2023-08-21
  Administered 2023-08-20: 15:00:00 25 ug via INTRAVENOUS

## 2023-08-20 MED ORDER — HEPARIN (PORCINE) 1,000 UNIT/ML IJ SOLN
INTRAVENOUS | 0 refills | Status: DC
Start: 2023-08-20 — End: 2023-08-20

## 2023-08-20 MED ORDER — ASPIRIN 81 MG PO CHEW
81 mg | Freq: Every day | ORAL | 0 refills | Status: DC
Start: 2023-08-20 — End: 2023-08-20

## 2023-08-20 MED ORDER — METOPROLOL SUCCINATE 25 MG PO TB24
25 mg | Freq: Two times a day (BID) | ORAL | 0 refills | Status: DC
Start: 2023-08-20 — End: 2023-08-20

## 2023-08-20 MED ORDER — DRONEDARONE 400 MG PO TAB
400 mg | Freq: Two times a day (BID) | ORAL | 0 refills | Status: DC
Start: 2023-08-20 — End: 2023-08-21
  Administered 2023-08-20: 23:00:00 400 mg via ORAL

## 2023-08-20 MED ORDER — BISACODYL 10 MG RE SUPP
10 mg | Freq: Every day | RECTAL | 0 refills | Status: DC | PRN
Start: 2023-08-20 — End: 2023-08-21

## 2023-08-20 MED ORDER — FINASTERIDE 5 MG PO TAB
5 mg | Freq: Every evening | ORAL | 0 refills | Status: DC
Start: 2023-08-20 — End: 2023-08-21
  Administered 2023-08-21: 01:00:00 5 mg via ORAL

## 2023-08-20 MED ORDER — WARFARIN 2 MG PO TAB
2 mg | ORAL | 0 refills | Status: DC
Start: 2023-08-20 — End: 2023-08-21

## 2023-08-20 MED ORDER — BUMETANIDE 1 MG PO TAB
1 mg | Freq: Two times a day (BID) | ORAL | 0 refills | Status: DC
Start: 2023-08-20 — End: 2023-08-21
  Administered 2023-08-20: 23:00:00 1 mg via ORAL

## 2023-08-20 MED ORDER — LIDOCAINE (PF) 10 MG/ML (1 %) IJ SOLN
.2 mL | INTRAMUSCULAR | 0 refills | Status: DC | PRN
Start: 2023-08-20 — End: 2023-08-20

## 2023-08-20 MED ORDER — CEFAZOLIN 1 GRAM IJ SOLR
INTRAVENOUS | 0 refills | Status: DC
Start: 2023-08-20 — End: 2023-08-20

## 2023-08-20 MED ORDER — ISOSORBIDE MONONITRATE 30 MG PO TB24
30 mg | Freq: Every morning | ORAL | 0 refills | Status: DC
Start: 2023-08-20 — End: 2023-08-20

## 2023-08-20 MED ORDER — PROTAMINE 10 MG/ML IV SOLN
INTRAVENOUS | 0 refills | Status: DC
Start: 2023-08-20 — End: 2023-08-20

## 2023-08-20 MED ORDER — POTASSIUM CHLORIDE 20 MEQ PO TBTQ
20-40 meq | ORAL | 0 refills | Status: DC | PRN
Start: 2023-08-20 — End: 2023-08-21
  Administered 2023-08-20: 15:00:00 40 meq via ORAL

## 2023-08-20 MED ORDER — SENNOSIDES-DOCUSATE SODIUM 8.6-50 MG PO TAB
2 | Freq: Two times a day (BID) | ORAL | 0 refills | Status: DC
Start: 2023-08-20 — End: 2023-08-21
  Administered 2023-08-20 – 2023-08-21 (×2): 2 via ORAL

## 2023-08-20 MED ORDER — MAGNESIUM HYDROXIDE 400 MG/5 ML PO SUSP
30 mL | Freq: Every day | ORAL | 0 refills | Status: DC | PRN
Start: 2023-08-20 — End: 2023-08-21

## 2023-08-20 MED ORDER — ASPIRIN 81 MG PO TBEC
81 mg | Freq: Once | ORAL | 0 refills | Status: CP
Start: 2023-08-20 — End: ?

## 2023-08-20 MED ORDER — HYDRALAZINE 20 MG/ML IJ SOLN
10 mg | INTRAVENOUS | 0 refills | Status: DC | PRN
Start: 2023-08-20 — End: 2023-08-21

## 2023-08-20 MED ORDER — DILTIAZEM HCL 180 MG PO CP24
180 mg | Freq: Two times a day (BID) | ORAL | 0 refills | Status: DC
Start: 2023-08-20 — End: 2023-08-21
  Administered 2023-08-21: 01:00:00 180 mg via ORAL

## 2023-08-20 MED ORDER — METOPROLOL SUCCINATE 25 MG PO TB24
25 mg | Freq: Every day | ORAL | 0 refills | Status: DC
Start: 2023-08-20 — End: 2023-08-20

## 2023-08-20 MED ORDER — GABAPENTIN 300 MG PO CAP
900 mg | Freq: Three times a day (TID) | ORAL | 0 refills | Status: DC
Start: 2023-08-20 — End: 2023-08-21
  Administered 2023-08-20 – 2023-08-21 (×2): 900 mg via ORAL

## 2023-08-20 MED ORDER — ACETAMINOPHEN 325 MG PO TAB
650 mg | ORAL | 0 refills | Status: DC | PRN
Start: 2023-08-20 — End: 2023-08-21
  Administered 2023-08-20: 20:00:00 650 mg via ORAL

## 2023-08-20 MED ORDER — SUGAMMADEX 100 MG/ML IV SOLN
INTRAVENOUS | 0 refills | Status: DC
Start: 2023-08-20 — End: 2023-08-20

## 2023-08-20 MED ORDER — POTASSIUM CHLORIDE IN WATER 10 MEQ/50 ML IV PGBK
10 meq | INTRAVENOUS | 0 refills | Status: DC | PRN
Start: 2023-08-20 — End: 2023-08-21

## 2023-08-20 MED ORDER — OXYCODONE 5 MG PO TAB
5 mg | ORAL | 0 refills | Status: DC | PRN
Start: 2023-08-20 — End: 2023-08-21

## 2023-08-20 MED ORDER — FERROUS SULFATE 325 MG (65 MG IRON) PO TAB
325 mg | Freq: Every day | ORAL | 0 refills | Status: DC
Start: 2023-08-20 — End: 2023-08-21

## 2023-08-20 MED ORDER — POTASSIUM CHLORIDE 20 MEQ/15 ML PO LIQD
20-40 meq | NASOGASTRIC | 0 refills | Status: DC | PRN
Start: 2023-08-20 — End: 2023-08-21

## 2023-08-20 MED ORDER — PROPOFOL INJ 10 MG/ML IV VIAL
INTRAVENOUS | 0 refills | Status: DC
Start: 2023-08-20 — End: 2023-08-20

## 2023-08-20 MED ORDER — CEFAZOLIN 2 GRAM IV SOLR
2 g | INTRAVENOUS | 0 refills | Status: CP
Start: 2023-08-20 — End: ?

## 2023-08-20 MED ORDER — ACETAMINOPHEN 500 MG PO TAB
1000 mg | Freq: Once | ORAL | 0 refills | Status: CP | PRN
Start: 2023-08-20 — End: ?

## 2023-08-20 MED ORDER — POTASSIUM CHLORIDE 20 MEQ PO TBTQ
20 meq | Freq: Two times a day (BID) | ORAL | 0 refills | Status: DC
Start: 2023-08-20 — End: 2023-08-21
  Administered 2023-08-20: 23:00:00 20 meq via ORAL

## 2023-08-20 MED ORDER — SODIUM CHLORIDE 0.9 % IV SOLP
INTRAVENOUS | 0 refills | Status: DC
Start: 2023-08-20 — End: 2023-08-20
  Administered 2023-08-20: 12:00:00 1000.0000 mL via INTRAVENOUS

## 2023-08-20 MED ADMIN — ACETAMINOPHEN 500 MG PO TAB [102]: 1000 mg | ORAL | @ 15:00:00 | Stop: 2023-08-20 | NDC 00904673061

## 2023-08-20 MED ADMIN — CEFAZOLIN 2 GRAM IV SOLR [462609]: 2 g | INTRAVENOUS | @ 20:00:00 | Stop: 2023-08-21 | NDC 00143913901

## 2023-08-20 MED ADMIN — WATER FOR INJECTION, STERILE IJ SOLN [79513]: 15 mL | INTRAVENOUS | @ 20:00:00 | Stop: 2023-08-20 | NDC 00409488723

## 2023-08-20 MED ADMIN — ASPIRIN 81 MG PO TBEC [14113]: 81 mg | ORAL | @ 12:00:00 | Stop: 2023-08-20 | NDC 63739021202

## 2023-08-20 NOTE — Progress Notes
 CARDIOPULMONARY REHABILITATION  INPATIENT ASSESSMENT    Cardiac Rehabilitation Staff: Georges Remington Discharge Date:     Demographics  Pre-admit Dx: Aortic Stenosis Date of Admission: 08/20/2023     Room: HC410/01 DOB:  11/07/1946   Insurance: Primary: UHC Medicare  Secondary: UHC Medicaid [place]   Address: [address]  [address]  [place] [address]   Patient Phone:  [phone] (home)        ED Contact: Gabor Lusk (spouse)  ED Phone #: [phone]   CTS: Dr. Zachary Labrum Cardiologist: Grayson Anes     Cardiac Procedures and Events     Valve: 08/20/23 (TAVR)         Risk Factors  Risk Factors: Hypertension, Obesity, Hyperlipidemia  BP: 122/68  Height: 182.9 cm (6')  Weight: 123.2 kg (271 lb 9.7 oz)  BMI (Calculated): 36.84      Medical History   has a past medical history of Aortic stenosis, Arthritis, Atrial fibrillation (HCC), Back pain, Basal cell carcinoma, CAD (coronary artery disease), Cardiac dysrhythmia, Cardiac pacemaker in situ (05/02/2011), CHF (congestive heart failure), Chronic anticoagulation, on warfarin (08/16/2014), CKD (chronic kidney disease) stage 3, GFR 30-59 ml/min (HCC), DOE (dyspnea on exertion), GERD (gastroesophageal reflux disease), H/O percutaneous transluminal coronary angioplasty, HLD (hyperlipidemia) (11/12/2009), Hyperlipemia, Hypertension, MI (myocardial infarction) (HCC), MR (mitral regurgitation), MRSA infection (2012), Obesity (BMI 30-39.9) (08/17/2013), Paroxysmal atrial fibrillation (HCC) (04/19/2012), Peripheral neuropathy (11/12/2009), Pulmonary hypertension (HCC), Sinus bradycardia (03/16/2010), Sleep apnea (08/17/2013), Squamous cell carcinoma nos, SSS (sick sinus syndrome) (HCC) (05/21/2010), and TMJ (dislocation of temporomandibular joint).    Labs  Cholesterol   Date Value Ref Range Status   07/29/2022 117 <200 MG/DL Final     Triglycerides   Date Value Ref Range Status   07/29/2022 74 <150 MG/DL Final     HDL   Date Value Ref Range Status   07/29/2022 43 >40 MG/DL Final     LDL   Date Value Ref Range Status   07/29/2022 61 <100 mg/dL Final     Hemoglobin J8R   Date Value Ref Range Status   08/17/2023 5.5 4.0 - 5.7 % Final     Comment:     The ADA recommends that most patients with type 1 and type 2 diabetes maintain   an A1c level <7%.       Troponin-I   Date Value Ref Range Status   05/11/2023 <0.010  Final         Heart Resource Manual Given:       Teaching Completed:       Outpatient Cardiopulmonary Rehabilitation    Outpatient Cardic Rehab:      Referral Faxed to:       Date Faxed:      Location:      If [place], Sent to Staff:            Georges Remington  08/20/2023

## 2023-08-20 NOTE — Interval H&P Note
 History and Physical Update Note    Allergies:  Amiodarone hcl, Duloxetine, Lyrica [pregabalin], Demerol (pf) [meperidine (pf)], and Tuberculin ppd tine test    Lab/Radiology/Other Diagnostic Tests:  Hematology:    Lab Results   Component Value Date    HGB 11.2 08/17/2023    HCT 32.8 08/17/2023    PLTCT 132 08/17/2023    WBC 5.2 08/17/2023    NEUT 67 08/03/2022    ANC 4.26 08/03/2022    ALC 1.09 08/03/2022    MONA 7 08/03/2022    AMC 0.47 08/03/2022    EOSA 8 08/03/2022    ABC 0.04 08/03/2022    MCV 87.2 08/17/2023    MCH 29.8 08/17/2023    MCHC 34.1 08/17/2023    MPV 7.1 08/17/2023    RDW 16.7 08/17/2023   , Basic Chemistry:   Lab Results   Component Value Date    NA 138 08/17/2023    K 4.3 08/17/2023    CL 104 08/17/2023    CO2 24 08/17/2023    BUN 37 08/17/2023    CR 1.66 08/17/2023    GLU 92 08/17/2023    GLU 76 05/13/2010    CA 9.1 08/17/2023   , Mg and PO4:   Lab Results   Component Value Date    MG 2.1 05/11/2023   , and HgbA1C:   Lab Results   Component Value Date    HGBA1C 5.5 08/17/2023     Point of Care Testing:  (Last 24 hours):        Nutrition: No Dietitian Consult  Wound: No Wound Consult      I have examined the patient, and there are no significant changes in their condition, from the previous H&P performed on 07/28/23. Patient to undergo transcatheter aortic valve replacement today with Dr. Linford Labrum and Dr. Grayson as planned. Patients states that Dr. Linford Labrum and Dr. Grayson  explained the procedure and risk to patients satisfaction. Pertinent preoperative testing including history, labs and imaging reviewed. Patient denies any new symptoms or recent exposure to COVID-19. Patient understands procedure and all questions were answered. Surgical/Blood consents are signed and in chart. NPO since midnight. Pre-Surgical scrubs complete. Last dose of warfarin 08/14/23, last dose of plavix 08/20/23.      Tinnie DELENA Haley, APRN-NP  Pager [phone]    --------------------------------------------------------------------------------------------------------------------------------------------

## 2023-08-20 NOTE — Operative Report(Direct Entry)
 OPERATIVE REPORT    Name: Robert Mercado is a 77 y.o. 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  No        Description and Findings of Operative Procedure:     After informed consent patient was brought the operating room placed supine the table.  Arterial venous lines were placed per anesthesia.  General endotracheal anesthesia was induced and the patient was prepped and draped in usual sterile fashion.  Access was gained to bilateral common femoral arteries using ultrasound guidance and a micropuncture technique.  On the left a 5 French sheath was placed in the artery and a 6 French sheath was placed in the pain.  On the right 2 Pro-glide sutures were placed and an 8 French sheath was positioned.  Over stiff wire the 8 French sheath was exchanged for the Edwards E sheath.  Systemic heparinization was achieved and maintained throughout the procedure.    AL-1 was used to cross the aortic valve.  After catheter wire exchange a pigtail catheter was positioned in the ventricle and the noncoronary sinus.  Simultaneous pressure measurements were obtained.    Safari wire was then positioned in the ventricle and over this a 29 mm S3 with 5 extra cc was advanced and deployed under rapid ventricular pacing.  There was immediate hemodynamic recovery.  There was no significant residual stenosis but there was mild regurgitation.  Second balloon angioplasty was performed using 2 extra cc in the balloon.  There was significant expansion of the cage.  TEE demonstrated no paravalvular or transvalvular leak.  Ascending aortogram confirmed this.  There was no evidence of arterial injury on arteriogram.    Delivery system and sheath were removed and Pro-glide sutures were secured with good hemostasis.  Protamine was administered.  Iliofemoral arteriogram with runoff showed good distal runoff without evidence of arterial injury.  6 French Angio-Seal was used to close the left common femoral artery.  Venous catheter was removed and manual pressure was held.  Patient was awakened and extubated the room having tolerated the procedure well.  All sponge, needle and instrument counts were correct per report.        Estimated Blood Loss: 20 cc    Specimen(s) Removed/Disposition: * No specimens in log *    Attestation: I was present for the entire procedure.    Complications:  None      Implants:   Implant Name Serial No. Manufacturer Lot No. LRB No. Used Action   [id number] Ultra [address] Cmnd Sys Edwards 29mm KIT - [id number] [employer] N/A N/A 1 Implanted       Drains: Details on drains available on  LDA report    Disposition:  PACU - stable          Zachary LITTIE Meriel DOUGLAS, MD  Pager

## 2023-08-20 NOTE — Progress Notes
 HC4 END OF SHIFT NURSING NOTE  Acute events, nursing interventions, and communication with providers:       Cardiac Rhythm: SR/A-pace    Activity: Walk x1      If Other, please explain:     Incentive Spirometer this shift: Yes  Max volume:     Intake and Output:        Intake/Output Summary (Last 24 hours) at 08/20/2023 1841  Last data filed at 08/20/2023 1600  Gross per 24 hour   Intake 300 ml   Output 200 ml   Net 100 ml          Last Bowel Movement Date: 08/19/23 (per pt report)      Patient Education  Quality/Safety Education: Pain scale   Cardiac - Specific Education: Post cardiac procedure care: groin care and expectations  Other Patient education provided:   Learners: Patient  The following teaching method(s) were used: Verbal teaching  Response to learning: [employer] Understanding  Needs reinforcement on:     Patient Goal(s):  Patient will Participate in activity progression with PT/OT/Cardiac Rehab as ordered  and Verbalize understanding of individual dietary and fluid recommendations  by discharge date         Patient will  Use incentive spirometer 10xhr while awake  by discharge date   Other:    Restraints:  No     Restraint Goal: Patient will be free from injury while physically restrained.  See Docflowsheet for restraint documentation, interventions, education, etc.

## 2023-08-20 NOTE — Progress Notes
 08/20/23 1400   Current Cardiac Procedures/Events   Pre Admit Dx Aortic Stenosis   Valve 08/20/23  (TAVR)   Risk Factors   Risk Factors Hypertension;Obesity;Hyperlipidemia   BP 133/71   Education   Person Instructed Patient;Spouse   Patient Barriers To Learning None Noted   Interventions/Teaching Methods Written Materials Provided;Verbal Instructions   Patient Response Verbalized Understanding   Topics Angina and NTG use;Wound Care;Reinforcement of Medications - Anti-Platelet;Reinforcement of Medications - Anti-Coagulant;Reasons to call your doctor;Risk Factor Management - Blood Pressure;Risk Factor Management - Diabetes;Risk Factor Management - Stress;Outpatient Cardiac Rehab Referral Information;Home Walking Guidelines;Risk Factor Management - Physical Inactivity;Risk Factor Management - Obesity;Risk Factor Management - Cholesterol   Teaching Completed 08/20/23   Heart Resource Manual Given 08/20/23   Comments Pt was given cardiac rehab and discharge information. Pt was agreeable to have referral sent to [facility]. All questions answered at this time.   Outpatient Cardiopulmonary Rehab   OPCR Yes   [facility]    [phone]   Date Faxed 08/20/23

## 2023-08-20 NOTE — Operative Report(Direct Entry)
 TAVR (Transcatheter Aortic Valve Replacement)  Date of procedure: 08/20/2023    Robert Mercado, DOB: 03/20/1946  MRN: [id number]      INDICATIONS FOR PROCEDURE: Robert Mercado  is a 77 y.o.  who has severe symptomatic aortic valve stenosis/insufficiency. In view of significant co-morbidities, He is felt to be at high risk to undergo standard open surgical AVR as evaluated by the combined heart team. After a discussion with the combined heart team of the risks and benefits and alternative of proceeding with surgical versus transcatheter AVR, Robert Mercado has elected to proceed with TAVR.      OPERATORS:   Oneil Burrs, MD, Interventional Cardiology.  Linford Labrum, MD, Cardiothoracic Surgery.    PROCEDURES PERFORMED:   Transvenous pacemaker through right IJ access.  Bilateral US  guided access.  Left heart cardiac catheterization with simultaneous pressure assessment of the LV and aorta both pre and post TAVR.  Ascending aortography.  Descending aortography.  Aorto-iliac angiography.  Transcatheter valve replacement utilizing a number 29+7 Sapien S3 Ultra.    PROCEDURE:   Patient was informed and consented to risks, benefits, and alternatives. Patient verbalized understanding and wished to proceed.  General ansesthesia was performed under the care of cardiac anesthesia service. The anesthesia team monitored the patient's hemodynamics, airway, and sedation throughout the case.   Access was obtained in the right common femoral artery with placement of 8-French sheath. Proglides were deployed for preclosure.  Access was also obtained in the left common femoral artery with 5 -French arterial sheath.  We then exchanged the 8 F sheath for the [place] delivery sheath over a Safari wire.  Utilizing an AL1 catheter and Glidewire, access was obtained into the left ventricle.  Initial hemodynamics were performed utilizing simultaneous pigtail catheter assessments in both the aortic root and left ventricle. Hemodynamics revealed an LV pressure of 160/20 LVEDP and a  mean aortic  gradient of 14 mmHg.  Utilizing a Safari ES wire in the left ventricle, the valve was deployed under fluoroscopic guidance. The valve was well seated and had an average annular aortic:ventricular ratio of 95%:5%  TEE revealed a well seated TAVR valve with trace paravalvular regurgitation and no evidence of central valve regurgitation.     Ascending aortography revealed trace aortic valve regurgitation.  Excellent flow was noted within the coronary arteries as well as in the ascending aorta. There was no evidence of dissection, perforation, intramural hematoma, plaque disruption  or thrombus.  Post hemodynamic measurements had been obtained which revealed an LV pressure 150/12 LVEDP  with a mean gradient of 3 mmHg.   The right common femoral sheath was then removed and the dual ProGlide technique was used to close the arteriotomy. A descending aortography was performed with no evidence of dissection, perforation, intramural hematoma, and there was excellent brisk flow in the iliofemoral system. The left common femoral artery sheath was removed and the arteriotomy was closed with a 1F angioseal device.  Patient was extubated in the hybrid room at the conclusion of the case and transported to CTI in stable condition.  The patient's transvenous pacemaker which had been placed in the right IJ access was sutured in place. This had a threshold of 0.4 milliamps. It was left in a VVI mode with a rate of 40 beats per minute and an output of 10 milliamps.  A total of 85 mL of Visipaque 270 was utilized for the procedure.  Air Kerma was 395 mGy.      IMPRESSION:  Severe symptomatic aortic valve stenosis/insufficiency.  Successful transcatheter valve replacement utilizing a number 29+7 Sapien S3.   -Residual transaortic mean gradient of 3 mmHg.   -trace paravalvular regurgitation. No central valve regurgitation was noted.    RECOMMENDATIONS:   SBE prophylaxis indefinitely.  Aspirin 81 mg indefinitely  Echo Doppler in the morning.  Early ambulation.    Oneil Burrs, MD

## 2023-08-20 NOTE — Progress Notes
Report given to [place], [place].

## 2023-08-21 ENCOUNTER — Inpatient Hospital Stay: Admit: 2023-08-21 | Discharge: 2023-08-21 | Payer: MEDICARE

## 2023-08-21 ENCOUNTER — Encounter: Admit: 2023-08-21 | Discharge: 2023-08-21 | Payer: MEDICARE

## 2023-08-21 ENCOUNTER — Inpatient Hospital Stay: Admit: 2023-07-28 | Discharge: 2023-07-28 | Payer: MEDICARE

## 2023-08-21 DIAGNOSIS — I700 Atherosclerosis of aorta: Secondary | ICD-10-CM

## 2023-08-21 DIAGNOSIS — Z7901 Long term (current) use of anticoagulants: Secondary | ICD-10-CM

## 2023-08-21 DIAGNOSIS — Z7902 Long term (current) use of antithrombotics/antiplatelets: Secondary | ICD-10-CM

## 2023-08-21 DIAGNOSIS — I358 Other nonrheumatic aortic valve disorders: Secondary | ICD-10-CM

## 2023-08-21 DIAGNOSIS — Z950 Presence of cardiac pacemaker: Secondary | ICD-10-CM

## 2023-08-21 DIAGNOSIS — Z79899 Other long term (current) drug therapy: Secondary | ICD-10-CM

## 2023-08-21 DIAGNOSIS — I5042 Chronic combined systolic (congestive) and diastolic (congestive) heart failure: Secondary | ICD-10-CM

## 2023-08-21 DIAGNOSIS — Z736 Limitation of activities due to disability: Secondary | ICD-10-CM

## 2023-08-21 DIAGNOSIS — Z888 Allergy status to other drugs, medicaments and biological substances status: Secondary | ICD-10-CM

## 2023-08-21 DIAGNOSIS — Z885 Allergy status to narcotic agent status: Secondary | ICD-10-CM

## 2023-08-21 DIAGNOSIS — N1831 Chronic kidney disease, stage 3a (CMS-HCC): Secondary | ICD-10-CM

## 2023-08-21 DIAGNOSIS — I2510 Atherosclerotic heart disease of native coronary artery without angina pectoris: Secondary | ICD-10-CM

## 2023-08-21 DIAGNOSIS — E785 Hyperlipidemia, unspecified: Secondary | ICD-10-CM

## 2023-08-21 DIAGNOSIS — Z006 Encounter for examination for normal comparison and control in clinical research program: Secondary | ICD-10-CM

## 2023-08-21 DIAGNOSIS — I130 Hypertensive heart and chronic kidney disease with heart failure and stage 1 through stage 4 chronic kidney disease, or unspecified chronic kidney disease: Secondary | ICD-10-CM

## 2023-08-21 DIAGNOSIS — Z6837 Body mass index (BMI) 37.0-37.9, adult: Secondary | ICD-10-CM

## 2023-08-21 DIAGNOSIS — R911 Solitary pulmonary nodule: Secondary | ICD-10-CM

## 2023-08-21 DIAGNOSIS — G629 Polyneuropathy, unspecified: Secondary | ICD-10-CM

## 2023-08-21 DIAGNOSIS — Z955 Presence of coronary angioplasty implant and graft: Secondary | ICD-10-CM

## 2023-08-21 DIAGNOSIS — I77810 Thoracic aortic ectasia: Secondary | ICD-10-CM

## 2023-08-21 DIAGNOSIS — Z887 Allergy status to serum and vaccine status: Secondary | ICD-10-CM

## 2023-08-21 DIAGNOSIS — I340 Nonrheumatic mitral (valve) insufficiency: Secondary | ICD-10-CM

## 2023-08-21 DIAGNOSIS — E876 Hypokalemia: Secondary | ICD-10-CM

## 2023-08-21 DIAGNOSIS — I361 Nonrheumatic tricuspid (valve) insufficiency: Secondary | ICD-10-CM

## 2023-08-21 DIAGNOSIS — R54 Age-related physical debility: Secondary | ICD-10-CM

## 2023-08-21 DIAGNOSIS — I255 Ischemic cardiomyopathy: Secondary | ICD-10-CM

## 2023-08-21 DIAGNOSIS — I480 Paroxysmal atrial fibrillation: Secondary | ICD-10-CM

## 2023-08-21 DIAGNOSIS — N400 Enlarged prostate without lower urinary tract symptoms: Secondary | ICD-10-CM

## 2023-08-21 DIAGNOSIS — G4733 Obstructive sleep apnea (adult) (pediatric): Secondary | ICD-10-CM

## 2023-08-21 DIAGNOSIS — E669 Obesity, unspecified: Secondary | ICD-10-CM

## 2023-08-21 DIAGNOSIS — I3481 Nonrheumatic mitral (valve) annulus calcification: Secondary | ICD-10-CM

## 2023-08-21 DIAGNOSIS — N2889 Other specified disorders of kidney and ureter: Secondary | ICD-10-CM

## 2023-08-21 DIAGNOSIS — I252 Old myocardial infarction: Secondary | ICD-10-CM

## 2023-08-21 DIAGNOSIS — D696 Thrombocytopenia, unspecified: Secondary | ICD-10-CM

## 2023-08-21 DIAGNOSIS — I495 Sick sinus syndrome: Secondary | ICD-10-CM

## 2023-08-21 LAB — LIMITED ECHO
AO AT: 82 ms
AORTIC VALVE STROKE VOLUME INDEX: 31
AV INDEX (NATIVE): 0.4
AV MEAN GRADIENT: 6.4 mmHg
AV PEAK GRADIENT: 13 mmHg
AV PEAK VELOCITY: 1.8 m/s
AV VALVE AREA: 1.9 cm2
BSA: 2.5 m2
DOP CALC AO VTI: 37 cm
DOP CALC LVOT AREA: 4.1 cm2
DOP CALC LVOT DIAMETER: 2.3 cm
DOP CALC LVOT PEAK VEL VTI: 17 cm
DOP CALC LVOT PEAK VEL: 0.8 m/s
DOP CALC LVOT STROKE VOLUME: 74 cm3
E WAVE DECELARTION TIME: 198 ms
E/A RATIO: 1
ECHO EF: 50 %
LATERAL E/E' RATIO: 5.8
LEFT ATRIUM INDEX: 38 mL/m2 (ref 16–34)
LEFT ATRIUM VOLUME: 97 mL (ref 18–58)
LEFT VENTRICLE DIASTOLIC VOLUME INDEX: 69 mL/m2 (ref 34–74)
LEFT VENTRICLE DIASTOLIC VOLUME: 172 mL (ref 62–150)
LEFT VENTRICLE SYSTOLIC VOLUME INDEX: 30 mL/m2 (ref 11–31)
LEFT VENTRICLE SYSTOLIC VOLUME: 76 mL (ref 21–61)
MEDIAL E/E' RATIO: 8.7
MV PEAK A VEL: 0.6 m/s
MV PEAK E VEL PW: 0.7 m/s
RA PRESSURE: 15
RIGHT ATRIAL AREA: 27 cm2 (ref <18)
RIGHT HEART SYSTOLIC MMODE TAPSE: 2.4 cm (ref >1.7)
RV SYSTOLIC PRESSURE: 31
SIMPSONS BIPLANE EF: 56 %
TDI LATERAL E': 0.1 m/s
TDI MEDIAL E': 0 m/s
TR PEAK VELOCITY: 2.8 m/s
TV REST PULMONARY ARTERY PRESSURE: 46 mmHg

## 2023-08-21 LAB — ECG 12-LEAD
P AXIS: 45 degrees (ref <20.7)
P-R INTERVAL: 178 ms (ref 1.0–4.8)
Q-T INTERVAL: 480 ms (ref 0–0.45)
QRS DURATION: 188 ms — ABNORMAL HIGH (ref 0–0.80)
QTC CALCULATION (BAZETT): 510 ms (ref 0–0.20)
R AXIS: -87 degrees
T AXIS: 12 degrees
VENTRICULAR RATE: 68 {beats}/min (ref 1.8–7.0)

## 2023-08-21 LAB — CBC: PLATELET COUNT: 96 10*3/uL — ABNORMAL LOW (ref 150–400)

## 2023-08-21 LAB — BASIC METABOLIC PANEL
CALCIUM: 8.4 mg/dL — ABNORMAL LOW (ref 8.5–10.6)
EGFR: 46 mL/min — ABNORMAL LOW (ref >60)

## 2023-08-21 MED ORDER — BUMETANIDE 1 MG PO TAB
2 mg | Freq: Two times a day (BID) | ORAL | 0 refills | Status: DC
Start: 2023-08-21 — End: 2023-08-21

## 2023-08-21 MED ORDER — ACETAMINOPHEN 500 MG PO TAB
500-1000 mg | ORAL | 0 refills | 8.00000 days | Status: DC | PRN
Start: 2023-08-21 — End: 2024-04-07

## 2023-08-21 MED ORDER — PERFLUTREN LIPID MICROSPHERES 1.1 MG/ML IV SUSP
1-10 mL | Freq: Once | INTRAVENOUS | 0 refills | Status: CP | PRN
Start: 2023-08-21 — End: ?

## 2023-08-21 MED ORDER — SODIUM CHLORIDE 0.9 % IJ SOLN
10 mL | Freq: Once | INTRAVENOUS | 0 refills | Status: CP
Start: 2023-08-21 — End: ?

## 2023-08-21 MED ORDER — HYDROCODONE-ACETAMINOPHEN 5-325 MG PO TAB
1 | ORAL | 0 refills | Status: DC | PRN
Start: 2023-08-21 — End: 2023-08-21

## 2023-08-21 MED ORDER — ISOSORBIDE MONONITRATE 30 MG PO TB24
30 mg | Freq: Every day | ORAL | 0 refills | Status: DC
Start: 2023-08-21 — End: 2023-08-21

## 2023-08-21 MED ORDER — METOPROLOL SUCCINATE 25 MG PO TB24
25 mg | Freq: Two times a day (BID) | ORAL | 0 refills | Status: DC
Start: 2023-08-21 — End: 2023-08-21

## 2023-08-21 MED ADMIN — CLOPIDOGREL 75 MG PO TAB [78966]: 75 mg | ORAL | @ 14:00:00 | Stop: 2023-08-21 | NDC 00904629461

## 2023-08-21 MED ADMIN — GABAPENTIN 300 MG PO CAP [18308]: 900 mg | ORAL | @ 14:00:00 | Stop: 2023-08-21 | NDC 67877022305

## 2023-08-21 MED ADMIN — PERFLUTREN LIPID MICROSPHERES 1.1 MG/ML IV SUSP [79178]: 4 mL | INTRAVENOUS | @ 14:00:00 | Stop: 2023-08-21 | NDC 11994001116

## 2023-08-21 MED ADMIN — POTASSIUM CHLORIDE 20 MEQ PO TBTQ [35943]: 20 meq | ORAL | @ 14:00:00 | Stop: 2023-08-21 | NDC 00832532510

## 2023-08-21 MED ADMIN — ACETAMINOPHEN 325 MG PO TAB [101]: 650 mg | ORAL | @ 14:00:00 | Stop: 2023-08-21 | NDC 00904677361

## 2023-08-21 MED ADMIN — PANTOPRAZOLE 40 MG PO TBEC [80436]: 40 mg | ORAL | @ 14:00:00 | Stop: 2023-08-21 | NDC 00904647461

## 2023-08-21 MED ADMIN — METOPROLOL SUCCINATE 25 MG PO TB24 [81866]: 25 mg | ORAL | @ 14:00:00 | Stop: 2023-08-21 | NDC 00904632261

## 2023-08-21 MED ADMIN — CEFAZOLIN 2 GRAM IV SOLR [462609]: 2 g | INTRAVENOUS | @ 14:00:00 | Stop: 2023-08-21 | NDC 00143913901

## 2023-08-21 MED ADMIN — ISOSORBIDE MONONITRATE 30 MG PO TB24 [24521]: 30 mg | ORAL | @ 14:00:00 | Stop: 2023-08-21 | NDC 00904644961

## 2023-08-21 MED ADMIN — BUMETANIDE 1 MG PO TAB [9310]: 2 mg | ORAL | @ 14:00:00 | Stop: 2023-08-21 | NDC 00904701606

## 2023-08-21 MED ADMIN — POTASSIUM CHLORIDE 20 MEQ PO TBTQ [35943]: 20 meq | ORAL | @ 11:00:00 | Stop: 2023-08-21 | NDC 00832532510

## 2023-08-21 MED ADMIN — GUAIFENESIN 600 MG PO TA12 [321343]: 600 mg | ORAL | @ 14:00:00 | Stop: 2023-08-21 | NDC 68084057211

## 2023-08-21 MED ADMIN — DILTIAZEM HCL 180 MG PO CP24 [29272]: 180 mg | ORAL | @ 14:00:00 | Stop: 2023-08-21 | NDC 50742024930

## 2023-08-21 MED ADMIN — SODIUM CHLORIDE 0.9 % IJ SOLN [7319]: 10 mL | INTRAVENOUS | @ 14:00:00 | Stop: 2023-08-21 | NDC 00409488820

## 2023-08-21 MED ADMIN — FERROUS SULFATE 325 MG (65 MG IRON) PO TAB [3074]: 325 mg | ORAL | @ 14:00:00 | Stop: 2023-08-21 | NDC 00904759161

## 2023-08-21 MED ADMIN — CEFAZOLIN 2 GRAM IV SOLR [462609]: 2 g | INTRAVENOUS | @ 04:00:00 | Stop: 2023-08-21 | NDC 00143913901

## 2023-08-21 MED ADMIN — MAGNESIUM OXIDE 400 MG (241.3 MG MAGNESIUM) PO TAB [10491]: 200 mg | ORAL | @ 11:00:00 | Stop: 2023-08-21 | NDC 10006070028

## 2023-08-21 MED ADMIN — DRONEDARONE 400 MG PO TAB [173842]: 400 mg | ORAL | @ 14:00:00 | Stop: 2023-08-21 | NDC 00024414260

## 2023-08-21 NOTE — Progress Notes
 HC4 END OF SHIFT NURSING NOTE  Acute events, nursing interventions, and communication with providers:   Assumed care @1900 .   A&Ox4.  Tolerating RA.   VSS per pt trend.   Denies N/V.   Surgical incison C/D/I.   Pain controlled on current regimen. No acute events, No communication with providers.  Ambulate c^1.    HFR bundle in place, call light within reach. No further orders at this time, care ongoing. Assessments completed and documented per flow sheet.      Cardiac Rhythm: AV paced/ Vpaced    Activity: Other       If Other, please explain: up to bathroom/scale    Incentive Spirometer this shift: No  Max volume: n/a    Intake and Output:        Intake/Output Summary (Last 24 hours) at 08/21/2023 0654  Last data filed at 08/21/2023 0500  Gross per 24 hour   Intake 540 ml   Output 1725 ml   Net -1185 ml          Last Bowel Movement Date: 08/21/23      Patient Education  Quality/Safety Education: Fall risk and Pain scale   Cardiac - Specific Education: Post cardiac procedure care: TAVR  Other Patient education provided:   Learners: Patient  The following teaching method(s) were used: Verbal teaching  Response to learning: [employer] Understanding  Needs reinforcement on:     Patient Goal(s):  Patient will Achieve timely healing; be free of purulent secretions, drainage, or erythema; and be afebrile , Participate in activity progression with PT/OT/Cardiac Rehab as ordered , and Progress toward pain management goal  by the end of shift         Patient will  Demonstrate improved self care  and Verbalize readiness for discharge  by discharge date   Other:    Restraints:  No     Restraint Goal: Patient will be free from injury while physically restrained.  See Docflowsheet for restraint documentation, interventions, education, etc.

## 2023-08-21 NOTE — Progress Notes
 PHYSICAL THERAPY  ASSESSMENT      Name: Robert Mercado   MRN: [id number]     DOB: August 25, 1946      Age: 77 y.o.  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    Special Considerations: Vision impaired-glasses  Mental / Cognitive: Alert;Oriented;Cooperative;Follows commands  Pain: Complains of pain  Pain level: 3/10;Before activity;7/10;During activity  Pain Location: Right;Hip  Pain Description: Sharp  Pain Interventions: Patient agrees to participate in therapy with current pain level;Treatment altered to patient's pain tolerance;Patient assisted into position of comfort  Persons Present: Student    Home Living Situation  Lives With: Spouse/significant other  Comments: Son lives close by.  Type of Home: Apartment  Entry Stairs: No stairs  In-Home Stairs: No stairs  Bathroom Setup: Full bathroom on main living level  Patient Owned Equipment: Toilet: Grab bars;Walker with wheels;Cane: Single point;Wheelchair  Comments: Power wheelchair    Prior Level of Function  Level Of Independence: Independent with ADL and household mobility with device  Comments: Primarily uses his cane for mobility within the home. Sleeps in his recliner.  History of Falls in Past 3 Months: No    ROM  R UE ROM: WFL   R UE ROM Method: Active  L UE ROM: WFL  R LE ROM: Not WFL  R LE ROM Method: Active  L LE ROM: WFL  L LE ROM Method: Active  ROM Comments: RLE limited due to chronic hip pain.    Strength  Overall Strength: Generalized weakness    Posture/Neurological  Posture: Rounded shoulders;Forward head  Overall Tone: Normal  Posture/Neuro Comments: BLE edema noted.    Bed Mobility/Transfer  Bed Mobility: Supine to Sit: Minimal Assist;Head of Bed Elevated;Requires Extra Time    Transfer Type: Sit to Stand  Transfer: Assistance Level: From;Bed;Minimal Assist  Transfer: Assistive Device: Nurse, adult  Transfers: Type Of Assistance: Verbal Cues;Elevated Bed;For Strength Deficit;For Safety Considerations    Other Transfer Type: Sit to Stand  Other Transfer: Assistance Level: From;Toilet;Moderate Assist  Other Transfer: Assistive Device:  (Grab bar)  Other Transfer: Type Of Assistance: Verbal Cues;For Balance;For Strength Deficit;For Safety Considerations    End Of Activity Status: Up in Chair;Nursing Notified;Instructed Patient to Request Assist with Mobility;Instructed Patient to Use Call Light    Comments: Increased assist required from toilet (lower surface). Reports he has grab bars on each side at home and typically has no issue with toilet transfers.    Balance  Sitting Balance: No UE Support;Standby Assist  Standing Balance: 2 UE support;Standby Assist    Gait  Gait Distance: 120 feet  Gait: Assistance Level: Minimal Assist;Standby Assist;Management of Lines (CGA to SBA)  Gait: Assistive Device: Nurse, adult  Gait: Descriptors: Forward trunk flexion;Pace: Slow;Decreased stance time RLE;Decreased step length  Comments: One standing rest break due to fatigue. Reports 7/10 R hip pain during gait. Reports he is near  baseline for endurance/gait.  Activity Limited By: Complaint of Pain;Complaint of Fatigue    Education  Persons Educated: Patient  Patient Barriers To Learning: None Noted  Teaching Methods: Verbal Instruction  Patient Response: Verbalized Understanding  Topics: Plan/Goals of PT Interventions;Use of Assistive Device/Orthosis;Mobility Progression;Up with Assist Only;Importance of Increasing Activity;Equipment Recommendations;Recommend Continued Therapy;Therapy Schedule    Assessment/Progress  Impaired Mobility Due To: Decreased Strength;Pain;Impaired Balance;Decreased Activity Tolerance;Deconditioning;Medical Status Limitation  Impaired Strength Due To: Pain;Decreased Activity Tolerance;Deconditioning;Medical Status Limitation  Assessment/Progress: Should Improve w/ Continued PT  Comments: Patient presents with balance, strength, and endurance deficits. Limited by chronic R hip pain. Will benefit from ongoing PT to improve functional mobility    AM-PAC 6 Clicks Basic Mobility Inpatient  Turning from your back to your side while in a flat bed without using bed rails: A Little  Moving from lying on your back to sitting on the side of a flat bed without using bedrails : A Little  Moving to and from a bed to a chair (including a wheelchair): A Little  Standing up from a chair using your arms (e.g. wheelchair, or bedside chair): A Little  To walk in hospital room: A Little  Climbing 3-5 steps with a railing: A Lot  Basic Mobility Inpatient Raw Score: 17  Standardized (T-scale) Score: 39.67    Goals  Goal Formulation: With Patient  Time For Goal Achievement: 3 days, To, 5 days  Patient Will Go Supine To/From Sit: w/ Stand By Assist  Patient Will Transfer Bed/Chair: w/ Stand By Assist  Patient Will Ambulate: Greater than 200 Feet, w/ Walker, w/ Stand By Assist    Plan  Treatment Interventions: Mobility training;Strengthening;Balance activities;Endurance training  Plan Frequency: 5 Days per Week  PT Plan for Next Visit: Independence with bed mobility and transfers, progress gait distance    PT Discharge Recommendations  Recommendation: Home with consistent supervision/assistance (reports son lives nearby and can assist as needed)  Recommendation for Therapy Post Discharge: Home health  Patient Currently Requires Physical Assist With: Transfers  Patient Currently Requires Supervision For: Mobility  Patient Currently Requires Equipment: Owns what is needed    Therapist  Mariel Endo, PT, DPT  Date  08/21/2023

## 2023-08-21 NOTE — Case Management (ED)
 Case Management Progress Note    NAME:Robert Mercado                          MRN: [id number]              DOB:1946-02-02          AGE: 77 y.o.  ADMISSION DATE: 08/20/2023             DAYS ADMITTED: LOS: 1 day      Today's Date: 08/21/2023    PLAN: home with [facility]     Expected Discharge Date: 08/21/2023 2:00 PM  Is Patient Medically Stable: Yes   Are there Barriers to Discharge? no    INTERVENTION/DISPOSITION:  Discharge Planning              Discharge Planning: Home Health    NCM attended team huddle and reviewed EMR, pt interested in possible placement at dc - will follow up with LMSW and PT/OT recs.   Per patient choice pt would like the following referral sent on his behalf for home health services: [name] health department P: [phone] f [phone] - pts wife is active with them and he has had them in the past.   Signed orders and AVS with dc summary sent when available.   NCM will continue to follow to assist in D/C planning as needed    Transportation              Does the Patient Need Case Management to Arrange Discharge Transport? (ex: facility, ambulance, wheelchair/stretcher, Medicaid, cab, other): No  Will the Patient Use Family Transport?: Yes  Transportation Name, Phone and Availability #1: Son - Alm  Support              Support: Huddle/team update  Info or Referral                 Positive SDOH Domains and Potential Barriers          Medication Needs              Medication Needs: No Needs Identified    Financial              Financial: No Needs Identified  Legal              Legal: No Needs Identified  Other              Other/None: No needs identified  Discharge Disposition                                Home    Selected Continued Care - Admitted Since 08/20/2023    No services have been selected for the patient.         Jon Acosta RN BSN  Nurse Case Manager  Available via [employer] M-F [phone]  [place] Cell: [phone]   **please do not give this phone number to patients

## 2023-08-21 NOTE — Progress Notes
 1415 - Reviewed discharge instructions, medications, follow-up appt with pt. Pt states verbal understanding - no questions.  P-IV and Tele dc'd.  Pt has no prescriptions at this time.  When ready, pt taken to the front door by hospital staff.

## 2023-08-21 NOTE — Case Management (ED)
 Case Management Admission Assessment    NAME:Robert Mercado                          MRN: [id number]             DOB:10-Feb-1946          AGE: 77 y.o.  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      Patient is now s/p 1 TAVR.      This CM met with pt for assessment on this date.  Provided contact information and explanation of SW/NCM roles.  Reviewed [facility], Preparing for Discharge, and [facility] hand-outs.  Provided opportunity for questions and discussion. Pt/family encouraged to contact Case Management team with questions and concerns during hospitalization and until patient is able to transition back to the patient's primary care physician.    Pt lives with his wife in their ground floor apartment  Pt reports being fully independent of ADLs PTA  He has been on service with [facility] in the past and would prefer to be on service with them at time of dc  He has also been admitted to [facility] in the past for SNF rehab.  He has never been admitted to IPR or LTACH  He has a DPOA and his wife is named as his Management consultant  He denies any SA or MH concerns  He denies any history of infusions or HD  His adult son lives in [place] (approx 9 miles away) and will transport pt home today.  Pt's wife requires a lot of assistance at home and typically pt is her caregiver  Pt has access to a RW, Rollater, SPC, Electric WC, and grab bars at baseline  Follows with NP Robert Mercado  Cardiologist is Dr. Wilburn with [facility].    Pt has a home INR machine and Dr. Hanna office follows his levels  Pt fills all of his medications at [facility] in [place] and he is currently in the process of ensuring that all future medications will be mailed to his home in pill packs.    SW and NCM will follow along and assist with dc planning as needed/indicated.    PT recommending pt to dc home with HH.  Pt also voiced preference for [facility] with [facility].  SW notified NCM of same.    Patient Address/Phone  [address]  [place] [address]  [phone] (home)     Emergency Contact  Extended Emergency Contact Information  Primary Emergency Contact: [facility]  Address: [address]           [place], [place] [address] [place]   Home Phone: [phone]  Mobile Phone: [phone]  Relation: Spouse    Healthcare Directive  Healthcare Directive: Yes, patient has a healthcare directive  Type of Healthcare Directive: Durable  power of attorney for healthcare  Location of Healthcare Directive: Patient does not have it with him/her      Transportation  Does the Patient Need Case Management to Arrange Discharge Transport? (ex: facility, ambulance, wheelchair/stretcher, Medicaid, cab, other): No  Will the Patient Use Family Transport?: Yes  Transportation Name, Phone and Availability #1: Son Robert Mercado    Expected Discharge Date  08/21/2023 2:00 PM    Living Situation Prior to Admission  Living Arrangements  Type of Residence: Home, independent  Living Arrangements: Spouse/significant other  Financial risk analyst / Tub: Tub/Shower Unit  How many levels in the residence?: 1  Can patient live on one level if needed?: Yes  Does residence have entry and/or inside stairs?: No  Assistance needed prior to admit or anticipated on discharge: No  Level of Function   Prior level of function: Independent  Cognitive Abilities   Cognitive Abilities: Alert and Oriented, Engages in problem solving and planning, Participates in decision making, Recognizes impact of health condition on lifestyle, Understands nature of health condition    Financial Resources  Coverage  Primary Insurance: Quarry manager (UHC Medicare)  Secondary Insurance: Medicaid (UHC Medicaid)  Medicaid State: [place]   Additional Coverage: None  Medication Coverage    Medication Coverage: Medicaid  Have you experienced a noticeable increase in your copay costs recently?: No  Are current medications affordable?: Yes  Do You Use a Co-Pay Card or a Medication Assistance Program to Help Manage Medication Costs?: No  Do You Manage Your Own Medications?: Yes  Source of Income   Source Of Income: Other retirement income  Financial Assistance Needed?  N/A    Psychosocial Needs  Mental Health  Mental Health History: No  Substance Use History  Substance Use History Screen: No  Other  N/A    Current/Previous Services  PCP  Robert Mercado, [phone], [phone]  Pharmacy    [facility] - Robert Mercado - Robert, Robert Mercado - [address] [place]  [address]  [address]  [address]  [place] [address]  Phone: [phone] Fax: [phone]    [facility] - [place], [place] - [address]  [address]  [place] [address]  Phone: [phone] Fax: [phone]    Durable Medical Equipment   Durable Medical Equipment at home: Single [address], Roller Andres, Occupational hygienist, Biomedical scientist (power), Grab bars  Home Health  Receiving home health: In the past  Agency name: [facility]  Would patient use this agency again?: Yes  Hemodialysis or Peritoneal Dialysis  Undergoing hemodialysis or peritoneal dialysis: No  Tube/Enteral Feeds  Receive tube/enteral feeds: No  Infusion  Receive infusions: No  Private Duty  Private duty help used: No  Home and Community Based Services  Home and community based services: No  Ryan White  Ryan White: N/A  Hospice  Hospice: No  Outpatient Therapy  PT: No  OT: No  SLP: No  Skilled Nursing Facility/Nursing Home  SNF: In the past  When did patient receive care?: January 2024  Name of Facility: [facility]  Would patient return for future services?: Yes  NH: No  Inpatient Rehab  IPR: No  [facility]  LTACH: No  Acute [facility] Stay  Acute [facility] Stay: Yes  Was patient's stay within the last 30 days?: No      Harlene Bushy, LMSW  Surgery - Cardiothoracic Surgery   Social Work Case Manager  [phone]

## 2023-08-21 NOTE — Progress Notes
 Cardiothoracic Surgery Post-Op Note  Transcatheter Aortic Valve Replacement (TAVR)      NAME: Robert Mercado             MRN: [id number]                 DOB:01-02-1946          AGE: 77 y.o.  ADMISSION DATE: 08/20/2023             DAYS ADMITTED: LOS: 1 day    Primary Cardiologist: Dr. Velora    Procedure: Procedure(s):  Transcatheter Aortic Valve Replacement - Femoral Artery    Post-Op Day #: 1    Assessment  Principal Problem:    S/p TAVR (transcatheter aortic valve replacement), bioprosthetic  Active Problems:    HLD (hyperlipidemia)    CAD (coronary artery disease)    Peripheral neuropathy    SSS (sick sinus syndrome) (HCC)    Cardiac pacemaker in situ    Paroxysmal atrial fibrillation (HCC)    OSA (obstructive sleep apnea)    BMI 37.0-37.9, adult    Chronic anticoagulation, on warfarin    LAFB (left anterior fascicular block)    Chronic combined systolic and diastolic congestive heart failure, NYHA class 2    Nonrheumatic aortic valve stenosis    Hypokalemia    Hypocalcemia       Overnight Events:  No n/a    PLAN:  1. CV - Rhythm  AV paced   Temporary pacemaker in place? No  Pacing required? Yes.  Pre-Op Device: PPM in Place  Reason for pre op device: Sinus Node Dysfunction.  SBP 110-120s. Cont atorvastatin, diltiazem, dronedarone, reduce Metoprolol XL 25mg  to daily.  PTA cardiac meds atorvastatin, diltiazem, dronedarone- resumed these, isosorbide mononitrate ER, Toprol XL-25mg  bid.  Preop NT-Pro-BNP   NT-Pro-BNP   Date/Time Value Ref Range Status   08/17/2023 10:09 AM 1,088.0 (H) <450 pg/mL Final     Comment:     NOTE: Normal reference ranges for NT-proBNP vary by age:  <125 pg/mL for individuals < 79 years old  <450 pg/mL for individuals =/> 3 years old     .  Heart Failure? Yes, chronic combined systolic/diastolic heart failure NYHA Class 2. Intra-op EF report pending. Post Op Limited Echocardiogram ordered for tomorrow per protocol. Pulmonary HTN? No.  Atherosclerosis of aorta noted on Echocardiogram or CTA? Yes.  2. Resp - SpO2 95-100% on RA.  CXR reviewed:  Cardiomegaly with opacities in left mid and lower lung, likely atelectasis and/or scarring. Cont IS, aggressive pulm toilet.  COPD: No   Lab Results   Component Value Date    FVCPRE 4.61 07/28/2023    FVCPREDPRE 125 07/28/2023    FEV1PRE 3.55 07/28/2023    FEV1PREDPRE 131 07/28/2023     3. Renal - Creatinine 1.52. Chronic kidney disease: Yes. 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   Acute Kidney Injury? No.  I/O- will monitor. Weight:   Intake/Output Summary (Last 24 hours) at 08/21/2023 0711  Last data filed at 08/21/2023 0500  Gross per 24 hour   Intake 540 ml   Output 1725 ml   Net -1185 ml       Vitals:    08/21/23 0335   Weight: 124.5 kg (274 lb 6.4 oz)   .   Hyponatremia - No., Hypokalemia- Yes-Replace scale, recheck., Hypomagnesemia: Yes-Replace. Hypocalcemia?  Yes. PTA diuretic/electrolytes bumex 2mg  BID PRN- will give bumex 1mg  BID and KCL 40mEq daily- resume half KCL dose. PTA BPH meds: finasteride- resumed.  Lab Results   Component Value Date/Time    BUN 34 (H) 08/21/2023 03:50 AM    CR 1.52 (H) 08/21/2023 03:50 AM    GLU 118 (H) 08/21/2023 03:50 AM    GLU 76 05/13/2010 09:27 AM      Lab Results   Component Value Date/Time    NA 140 08/21/2023 03:50 AM    K 4.1 08/21/2023 03:50 AM    CA 8.4 (L) 08/21/2023 03:50 AM    CL 107 08/21/2023 03:50 AM    CO2 23 08/21/2023 03:50 AM    GAP 10 08/21/2023 03:50 AM     4. GI - Diet: Cardiac.  PTA PPI- resumed. BM post-op: No, cont bowel regimen. Chronic liver disease? No. Type: No  5. Heme/ID - WBC 4.9. Afebrile. Acute blood loss anemia? Yes, continue PTA ferrous sulfate Thrombocytopenia: Yes. Coagulopathy? No, due to:  No. Antiplatelet?: Yes Indication:Recent coronary stent  Anticoagulation?: Yes Indication: atrial fibrillation  Coumadin Dosing/INR Trend  Indication: A. Fib  Provider managing INR: Dr. Velora  Date 10/3 10/4        INR 1.3 1.2        Dose (mg) 3 2              Lab Results   Component Value Date    HGB 10.5 (L) 08/21/2023    WBC 5.7 08/21/2023    PLTCT 96 (L) 08/21/2023      6. Endo - Diabetes: No  HgA1c: 5.5. PTA meds: None.    7. Activity - OOBTC and ambulate with nursing and Cardiac rehab at least TID.  Anticipated discharge dispo: Home.  Chronic fatigue? Yes. Age related physical debility? Yes. Patient has hip issues. Having trouble getting in and out of bed. Uses a cane  8. Neuro/Psych - pain well controlled.  PTA meds: gabapentin- resumed, norco- on hold.   9. Disposition -  Echocardiogram today. Resume anticoagulation for atrial fib. Continue diuresis. Monitor activity level today for ability and safety. Consider home later today    Total Time Today was 25 minutes in the following activities: Preparing to see the patient, Obtaining and/or reviewing separately obtained history, Performing a medically appropriate examination and/or evaluation, Counseling and educating the patient/family/caregiver, Ordering medications, tests, or procedures, Referring and communication with other health care professionals (when not separately reported), Documenting clinical information in the electronic or other health record, Independently interpreting results (not separately reported) and communicating results to the patient/family/caregiver, and Care coordination (not separately reported)      Robert JINNY Manning, PA-C  Cardiothoracic Surgery  Reach me on Voalte   at 08/21/2023 7:11 AM    Subjective  Robert Mercado is a 77 y.o. male who has a history of symptomatic valvular heart disease.  He is now s/p Transcatheter Aortic Valve Replacement (TAVR).    History of Present Illness  Robert Mercado is s/p TAVR. 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 He was admitted in February to an outside hospital for chest discomfort.  His troponin was elevated consistent with NSTEMI.  He had a cardiac catheterization that showed 90% stenosis to the ramus.  There was attempt at PCI but it was unsuccessful.  He was readmitted a few days later with recurrent chest discomfort.  PCI to  the ramus with was performed with a single drug-eluting stent.       He follows routinely with EP and saw Dr. Maylene in June.  He was started on dronedarone for rhythm control.  He decided against ablation.  An echocardiogram was ordered that showed an EF of 58%.  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  He has aortic stenosis with a mean gradient of 25 mmHg and peak velocity of 3.4 m/s, DI of 0.2 and aortic valve area of 0.84.  This was thought to represent low-flow, low gradient severe aortic stenosis.  He is referred to valve clinic for further evaluation.     Mr. Latin lives with his spouse.  He continues to drive and is fairly independent but somewhat sedentary.  He is limited by hip pain and may eventually need hip surgery.  He does get short of breath with exertion.  He was short of breath walking to the pulmonary department today and had to take a wheelchair back to clinic.  He has chronic lower extremity edema that is fairly significant.  He rarely has chest discomfort but does get some pain when he takes a deep breath.  He sleeps in a recliner but states this is due to the discomfort from his hip.  He has not been using CPAP since sleeping in a recliner.       Review of Systems  Cardiovascular: denies chest pain, palpitations, shortness of breath  Respiratory: denies cough, wheezing, sputum      Objective  Vitals:    08/21/23 0200 08/21/23 0335 08/21/23 0400 08/21/23 0600   BP: (!) 151/68  (!) 155/69 (!) 158/67   BP Source:       Pulse: 60  68 60   Temp:  37.2 ?C (99 ?F)     SpO2: 100%  98% 97%   O2 Device: None (Room air)  None (Room air) None (Room air)   O2 Liter Flow:       Weight:  124.5 kg (274 lb 6.4 oz)     Height:           Physical Exam  Cardiovascular: IRR Without murmur  Lungs:  CTAB  GI: Soft, non-tender, +bowel sounds  Extremities: no edema  Groins/Incision site: hematoma:No, Location: Right Groin and Left Groin groin, soft    Intake/Output Summary (Last 24 hours) at 08/21/2023 0711  Last data filed at 08/21/2023 0500  Gross per 24 hour   Intake 540 ml   Output 1725 ml   Net -1185 ml      Pertinent Meds:           Taking      Reason for Not Taking  1. Aspirin No On Plavix- no triple therapy indication   2. B-Blocker Yes N/a   3. Statin Yes N/a   4. ACE/ARB No No indication (Ejection fraction>40)     Prophylaxis Review:  Lines:  Yes; Arterial Line; Indication:  Continuous BP monitoring; Location:  Radial  Antibiotic Usage:  Yes; surgical prophylaxis.   VTE: Mechanical prophylaxis; Sequential compression device  Urinary Catheter: No    Basic Metabolic Profile    Lab Results   Component Value Date/Time    NA 140 08/21/2023 03:50 AM    K 4.1 08/21/2023 03:50 AM    CA 8.4 (L) 08/21/2023 03:50 AM    CL 107 08/21/2023 03:50 AM    CO2 23 08/21/2023 03:50 AM    GAP  10 08/21/2023 03:50 AM    Lab Results   Component Value Date/Time    BUN 34 (H) 08/21/2023 03:50 AM    CR 1.52 (H) 08/21/2023 03:50 AM    GLU 118 (H) 08/21/2023 03:50 AM    GLU 76 05/13/2010 09:27 AM        CBC w/Diff    Lab Results   Component Value Date/Time    WBC 5.7 08/21/2023 03:50 AM    RBC 3.57 (L) 08/21/2023 03:50 AM    HGB 10.5 (L) 08/21/2023 03:50 AM    HCT 31.6 (L) 08/21/2023 03:50 AM    MCV 88.4 08/21/2023 03:50 AM    MCH 29.5 08/21/2023 03:50 AM    MCHC 33.3 08/21/2023 03:50 AM    RDW 16.5 (H) 08/21/2023 03:50 AM    PLTCT 96 (L) 08/21/2023 03:50 AM    MPV 7.2 08/21/2023 03:50 AM    Lab Results   Component Value Date/Time    NEUT 67 08/03/2022 02:53 AM    ANC 4.26 08/03/2022 02:53 AM    LYMA 17 (L) 08/03/2022 02:53 AM    ALC 1.09 08/03/2022 02:53 AM    MONA 7 08/03/2022 02:53 AM    AMC 0.47 08/03/2022 02:53 AM    EOSA 8 (H) 08/03/2022 02:53 AM    AEC 0.50 (H) 08/03/2022 02:53 AM    BASA 1 08/03/2022 02:53 AM    ABC 0.04 08/03/2022 02:53 AM

## 2023-08-21 NOTE — Case Management (ED)
CMA Note:       Emailed a list of covered HH in pts area per request from Charyl Bigger, [facility]     Carlynn Spry  Case Management Assistant

## 2023-08-24 ENCOUNTER — Encounter: Admit: 2023-08-24 | Discharge: 2023-08-24 | Payer: MEDICARE

## 2023-08-24 DIAGNOSIS — I493 Ventricular premature depolarization: Secondary | ICD-10-CM

## 2023-08-24 DIAGNOSIS — I480 Paroxysmal atrial fibrillation: Secondary | ICD-10-CM

## 2023-08-24 DIAGNOSIS — Z953 Presence of xenogenic heart valve: Secondary | ICD-10-CM

## 2023-08-24 DIAGNOSIS — Z7901 Long term (current) use of anticoagulants: Secondary | ICD-10-CM

## 2023-08-24 LAB — PROTIME INR (PT): INR: 1.1 g/dL — ABNORMAL LOW (ref 12.0–15.0)

## 2023-08-26 ENCOUNTER — Encounter: Admit: 2023-08-26 | Discharge: 2023-08-26 | Payer: MEDICARE

## 2023-08-28 ENCOUNTER — Ambulatory Visit: Admit: 2023-08-28 | Discharge: 2023-08-28 | Payer: MEDICARE

## 2023-08-28 ENCOUNTER — Encounter: Admit: 2023-08-28 | Discharge: 2023-08-28 | Payer: MEDICARE

## 2023-08-28 DIAGNOSIS — R0989 Other specified symptoms and signs involving the circulatory and respiratory systems: Secondary | ICD-10-CM

## 2023-08-28 DIAGNOSIS — E669 Obesity, unspecified: Secondary | ICD-10-CM

## 2023-08-28 DIAGNOSIS — S0300XA Dislocation of jaw, unspecified side, initial encounter: Secondary | ICD-10-CM

## 2023-08-28 DIAGNOSIS — I2510 Atherosclerotic heart disease of native coronary artery without angina pectoris: Secondary | ICD-10-CM

## 2023-08-28 DIAGNOSIS — Z950 Presence of cardiac pacemaker: Secondary | ICD-10-CM

## 2023-08-28 DIAGNOSIS — Z953 Presence of xenogenic heart valve: Secondary | ICD-10-CM

## 2023-08-28 DIAGNOSIS — I10 Essential (primary) hypertension: Secondary | ICD-10-CM

## 2023-08-28 DIAGNOSIS — I495 Sick sinus syndrome: Secondary | ICD-10-CM

## 2023-08-28 DIAGNOSIS — M549 Dorsalgia, unspecified: Secondary | ICD-10-CM

## 2023-08-28 DIAGNOSIS — E785 Hyperlipidemia, unspecified: Secondary | ICD-10-CM

## 2023-08-28 DIAGNOSIS — Z9861 Coronary angioplasty status: Secondary | ICD-10-CM

## 2023-08-28 DIAGNOSIS — R0609 Other forms of dyspnea: Secondary | ICD-10-CM

## 2023-08-28 DIAGNOSIS — G4730 Sleep apnea, unspecified: Secondary | ICD-10-CM

## 2023-08-28 DIAGNOSIS — I219 Acute myocardial infarction, unspecified: Secondary | ICD-10-CM

## 2023-08-28 DIAGNOSIS — I480 Paroxysmal atrial fibrillation: Secondary | ICD-10-CM

## 2023-08-28 DIAGNOSIS — I5042 Chronic combined systolic (congestive) and diastolic (congestive) heart failure: Secondary | ICD-10-CM

## 2023-08-28 DIAGNOSIS — E782 Mixed hyperlipidemia: Secondary | ICD-10-CM

## 2023-08-28 DIAGNOSIS — I272 Pulmonary hypertension, unspecified: Secondary | ICD-10-CM

## 2023-08-28 DIAGNOSIS — I499 Cardiac arrhythmia, unspecified: Secondary | ICD-10-CM

## 2023-08-28 DIAGNOSIS — I350 Nonrheumatic aortic (valve) stenosis: Secondary | ICD-10-CM

## 2023-08-28 DIAGNOSIS — I340 Nonrheumatic mitral (valve) insufficiency: Secondary | ICD-10-CM

## 2023-08-28 DIAGNOSIS — I4891 Unspecified atrial fibrillation: Secondary | ICD-10-CM

## 2023-08-28 DIAGNOSIS — G629 Polyneuropathy, unspecified: Secondary | ICD-10-CM

## 2023-08-28 DIAGNOSIS — K219 Gastro-esophageal reflux disease without esophagitis: Secondary | ICD-10-CM

## 2023-08-28 DIAGNOSIS — C4491 Basal cell carcinoma of skin, unspecified: Secondary | ICD-10-CM

## 2023-08-28 DIAGNOSIS — A4902 Methicillin resistant Staphylococcus aureus infection, unspecified site: Secondary | ICD-10-CM

## 2023-08-28 DIAGNOSIS — B49 Unspecified mycosis: Secondary | ICD-10-CM

## 2023-08-28 DIAGNOSIS — M1990 Unspecified osteoarthritis, unspecified site: Secondary | ICD-10-CM

## 2023-08-28 DIAGNOSIS — R001 Bradycardia, unspecified: Secondary | ICD-10-CM

## 2023-08-28 DIAGNOSIS — Z7901 Long term (current) use of anticoagulants: Secondary | ICD-10-CM

## 2023-08-28 DIAGNOSIS — N183 CKD (chronic kidney disease) stage 3, GFR 30-59 ml/min (HCC): Secondary | ICD-10-CM

## 2023-08-28 DIAGNOSIS — I509 Heart failure, unspecified: Secondary | ICD-10-CM

## 2023-08-28 MED ORDER — NYSTATIN 100,000 UNIT/GRAM TP POWD
Freq: Four times a day (QID) | TOPICAL | 1 refills | 30.00000 days | Status: AC
Start: 2023-08-28 — End: ?

## 2023-08-28 NOTE — Patient Instructions
No changes to your medications today.     Apply nystatin powder to bilateral groins up to 4 times daily.  Try to keep the area as dry as possible.     You will return on 11/22 for echo and office visit.

## 2023-08-28 NOTE — Progress Notes
Date of Service: 08/28/2023    Robert Mercado is a 77 y.o. male.       HPI     I had the pleasure of seeing Robert Mercado for hospital follow up after recent TAVR.  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  He developed severe low-flow low gradient aortic stenosis and was referred to the valve clinic for consideration of TAVR and felt to be a candidate. He was admitted in February to an outside hospital for chest discomfort. His troponin was elevated consistent with NSTEMI. He had a cardiac catheterization that showed 90% stenosis to the ramus. There was attempt at PCI but it was unsuccessful. He was readmitted a few days later with recurrent chest discomfort. PCI to the ramus with was performed with a single drug-eluting stent.     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  He tolerated the procedure well without complications.  His heart rhythm remained stable.  A postoperative echocardiogram revealed an EF of 50 to 55% with a well-seated 29 SAPIEN S3 valve in the aortic position, mean gradient 6.5 mmHg without stenosis or regurgitation.  He was discharged home on postoperative day #1.    Today he reports that he has been walking at home and trying to get 15 minutes a day.  He has chronic hip problems and therefore his activity is somewhat limited due to this.  He overall feels that his breathing has improved since his procedure.  He denies orthopnea or PND.  He has chronic lower extremity edema which has been stable.  Blood pressures at home have been stable as well as his weights.  He denies chest pain, palpitations but notes some occasional dizziness, lightheadedness, or near syncope.         Vitals:    08/28/23 1444   BP: 120/70   BP Source: Arm, Right Upper   Pulse: 71   PainSc: Zero   Weight: 124.7 kg (275 lb)   Height: 182.9 cm (6')     Body mass index is 37.3 kg/m?Marland Kitchen     Past Medical History  Patient Active Problem List    Diagnosis Date Noted    S/p TAVR (transcatheter aortic valve replacement), bioprosthetic 08/20/2023     08/20/23: Dr. Sindy Messing and Dr. Paris Lore      Hypokalemia 08/20/2023    Hypocalcemia 08/20/2023    Nonrheumatic aortic valve stenosis 07/28/2023    Renal mass 07/28/2023    Chronic combined systolic and diastolic congestive heart failure, NYHA class 2 (HCC) 04/30/2023    Medication side effects 08/11/2022     While hospitalized in September 2023 patient was initiated on amiodarone for rate control in the setting of atrial fibrillation.  He developed an extensive, generalized micropapular rash thought to be due to amiodarone.      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 Mackey Birchwood: 30% instent restenosis of previously placed stent in the LAD, and patent 2nd DIAG stent and 60% distal LAD disease. Medical management  11/20/2016 - Cardiac Catheterization:  [facility])  Moderately depressed LV function, ejection fraction 30% to 40% with global hypokinesis and no mitral regurgitation, well compensated (EDP equals 10).  Noncritical coronary disease as noted; left main 0% stenosis, LAD proximal  0%, mid 30%, circumflex 0%, RCA (dominant) 0%.   10/19/2020 - Cardiac Catheterization:  [facility])  Mid LAD lesion is 55% stenosed. IFR does not indicate any hemodynamically significant disease.  1st Diag lesion is 70% stenosed. Small to medium size.  2nd Diag lesion is 50% stenosed. Small-vessel.  There is mild left ventricular systolic dysfunction. The ejection fraction is 45-50% with apical wall hypokinesis.  04/17/2022 - [facility] MPI:   This study is normal with no evidence of significant myocardial ischemia. Left ventricular systolic function is low normal. There are no high risk prognostic indicators present.  The pharmacologic ECG portion of the study is negative for ischemia.  Comparison is made with a prior ADAC study completed 07/09/2018.  Ejection fraction was 46%.  Compared to the previous study left ventricular ejection fraction is increased, no change in perfusion findings.  In aggregate the current study is low risk in regards to predicted annual cardiovascular mortality rate.  01/09/2023 - Cardiac Catheterization:  ([facility])  Unsuccessful PCI of the subdivision of the ramus intermedius secondary to difficulty with access and inability to adequately cannulate the left main trunk   01/15/2023 - Cardiac Catheterization:  ([facility])  Successful PTCA/DES of ramus intermedius with reduction in stenosis from 90% to 0%.       Peripheral neuropathy 11/12/2009         Review of Systems   Constitutional: Negative.   HENT: Negative.     Eyes: Negative.    Cardiovascular:         Chest Tightness   Respiratory: Negative.     Endocrine: Negative.    Hematologic/Lymphatic: Negative. Skin: Negative.    Musculoskeletal: Negative.    Gastrointestinal: Negative.    Genitourinary: Negative.    Neurological: Negative.    Psychiatric/Behavioral: Negative.     Allergic/Immunologic: Negative.        Physical Exam  General Appearance: no acute distress  Skin: warm & intact  HEENT: unremarkable  Neck Veins: neck veins are flat & not distended  Chest Inspection: chest is normal in appearance  Auscultation/Percussion: lungs clear to auscultation, no rales, rhonchi, or wheezing  Cardiac Rhythm: regular rhythm & normal rate  Cardiac Auscultation: Normal S1 & S2, no S3 or S4, no rub  Murmurs: no cardiac murmurs   Extremities: no lower extremity edema; bilateral groin soft without hematoma, fungal infection noted in bilateral groin folds  Abdominal Exam: soft, non-tender, no masses, bowel sounds normal  Liver & Spleen: no organomegaly  Neurologic Exam: oriented to time, place and person;  no focal neurologic deficits  Psychiatric: Normal mood and affect.  Behavior is normal. Judgment and thought content normal.       Cardiovascular Studies  Preliminary EKG: Atrial paced, ventricular rate 71    Cardiovascular Health Factors  Vitals BP Readings from Last 3 Encounters:   08/28/23 120/70   08/21/23 119/63   08/17/23 112/64     Wt Readings from Last 3 Encounters:   08/28/23 124.7 kg (275 lb)   08/21/23 123.2 kg (271 lb 9.7 oz)   08/17/23 125.6 kg (277 lb)     BMI Readings from Last 3 Encounters:   08/28/23 37.30 kg/m?   08/21/23 36.83 kg/m?   08/17/23 37.57 kg/m?      Smoking Social History     Tobacco Use   Smoking Status Never   Smokeless Tobacco Never      Lipid Profile Cholesterol   Date Value Ref Range Status   07/29/2022 117 <200 MG/DL Final     HDL   Date Value Ref Range Status   07/29/2022 43 >40 MG/DL Final     LDL   Date Value Ref Range Status   07/29/2022 61 <100 mg/dL Final     Triglycerides   Date Value Ref Range Status   07/29/2022 74 <150 MG/DL Final      Blood Sugar Hemoglobin A1C   Date Value Ref Range Status   08/17/2023 5.5 4.0 - 5.7 % Final     Comment:     The ADA recommends that most patients with type 1 and type 2 diabetes maintain   an A1c level <7%.       Glucose   Date Value Ref Range Status   08/21/2023 118 (H) 70 - 100 MG/DL Final   29/56/2130 865 (H) 70 - 100 MG/DL Final   78/46/9629 92 70 - 100 MG/DL Final   52/84/1324 76 65 - 99 mg/dL Final     Comment:                   Fasting reference interval             Problems Addressed Today  Encounter Diagnoses   Name Primary?    S/p TAVR (transcatheter aortic valve replacement), bioprosthetic Yes    Cardiovascular symptoms     Nonrheumatic aortic valve stenosis     Paroxysmal atrial fibrillation (HCC)     SSS (sick sinus syndrome) (HCC)     Mixed hyperlipidemia     Cardiac pacemaker in situ     Chronic combined systolic and diastolic congestive heart failure, NYHA class 2 (HCC)     Coronary artery disease involving native coronary artery of native heart without angina pectoris     Fungus infection        Assessment and Plan     Aortic stenosis  Status post TAVR with a 29 SAPIEN S3 bioprosthetic valve.  He notes symptomatic improvement.  He will return on 11/22 for echo and office visit as part of the TVT registry.  He will need to complete home health prior to starting cardiac rehab.    Chronic fine systolic and diastolic heart failure  Ischemic cardiomyopathy  His EF has improved.  He appears well compensated today on exam.  He has NYHA class II symptoms.    Coronary artery disease  He had prior PCI to the LAD and diagonal and recent PCI to the ramus.  He is currently on Plavix and warfarin.    Paroxysmal atrial  arrhythmias  He follows closely with the EP team.  He is on warfarin for anticoagulation as well as dronedarone.    Sinus node dysfunction  He has a permanent pacemaker in place.    PVCs  He has had prior ablation.    OSA    CKD, stage III yea    Renal mass  Incidental finding of 3.6 cm left upper pole renal mass which is being followed conservatively by urology.    I appreciate the opportunity to participate in his care.    Dorena Cookey, APRN  Pager [phone]         Current Medications (including today's revisions)   acetaminophen (TYLENOL EXTRA STRENGTH) 500 mg tablet Take one tablet to two tablets by mouth every 6 hours as needed. Indications: pain    atorvastatin (LIPITOR) 80 mg tablet Take one tablet by mouth daily. (Patient taking differently: Take one tablet by mouth at bedtime daily.)    bumetanide (BUMEX) 2 mg tablet Take one tablet by mouth twice daily. Indications: accumulation of fluid resulting from chronic heart failure (Patient taking differently: Take one tablet by mouth daily as needed. Indications: accumulation of fluid resulting from chronic heart failure)    clopiDOGreL (PLAVIX) 75 mg tablet Take one tablet by mouth daily.    coenzyme Q10 200 mg capsule Take one capsule by mouth at bedtime daily.    dilTIAZem CD (CARDIZEM CD) 180 mg capsule Take one capsule by mouth twice daily. Indications: paroxysmal supraventricular tachycardia    diphenhydrAMINE HCL (BENADRYL) 25 mg capsule Take one capsule by mouth every 6 hours as needed. Indications: hives (Patient taking differently: Take one capsule by mouth at bedtime as needed. Indications: hives)    dronedarone (MULTAQ) 400 mg tablet Take one tablet by mouth twice daily with meals.    ferrous sulfate (FEOSOL) 325 mg (65 mg iron) tablet Take one tablet by mouth daily. Take on an empty stomach at least 1 hour before or 2 hours after food.    finasteride (PROSCAR) 5 mg tablet Take one tablet by mouth at bedtime daily.    fluticasone (FLONASE) 50 mcg/actuation nasal spray Apply two sprays to each nostril as directed as Needed. Shake bottle gently before using.    gabapentin (NEURONTIN) 300 mg capsule Take three capsules by mouth three times daily.    HYDROcodone/acetaminophen (NORCO) 10/325 mg tablet Take one tablet by mouth three times daily as needed for Pain.    isosorbide mononitrate ER (IMDUR) 30 mg tablet,extended release 24 hr Take one tablet by mouth every morning.    MELATONIN PO Take 12 mg by mouth at bedtime daily.    metoprolol succinate XL (TOPROL XL) 25 mg extended release tablet Take one tablet by mouth twice daily. Indications: high blood pressure    nitroglycerin (NITROSTAT) 0.4 mg tablet Take one tablet sublingually as needed every 5 min for chest pain X three    nystatin (NYSTOP) 100,000 unit/g topical powder Apply  topically to affected area four times daily.    pantoprazole DR (PROTONIX) 40 mg tablet Take one tablet by mouth daily.    polyethylene glycol 3350 (MIRALAX) 17 gram/dose powder Take seventeen g by mouth daily as needed for Constipation.    potassium chloride SR (KLOR-CON M20) 20 mEq tablet Take two tablets by mouth daily. (Patient taking differently: Take two tablets by mouth daily as needed.)    vit A/vit C/vit E/zinc/copper (PRESERVISION AREDS PO) Take 1 tablet by mouth twice daily.    warfarin (COUMADIN) 1  mg tablet Take 1-2 tablets by mouth in combination with 4 mg tablets as directed by PCP (Patient taking differently: Take one tablet by mouth on Sunday and Thursday with one-half (2mg ) of a 4mg  tablet. Total dose on Sunday and Thursday 3mg .)    warfarin (COUMADIN) 4 mg tablet Take one tablet by mouth daily. **DO NOT TAKE THIS UNTIL INDICATED BASED ON INR RESULTS** (Patient taking differently: Take one-half tablet by mouth daily. Takes 2mg  Monday, Tuesday, Wednesday, Friday and Saturday. Takes 3mg  on Sunday and Thursday.)

## 2023-08-29 ENCOUNTER — Encounter: Admit: 2023-08-29 | Discharge: 2023-08-29 | Payer: MEDICARE

## 2023-08-31 ENCOUNTER — Encounter: Admit: 2023-08-31 | Discharge: 2023-08-31 | Payer: MEDICARE

## 2023-08-31 DIAGNOSIS — I493 Ventricular premature depolarization: Secondary | ICD-10-CM

## 2023-08-31 DIAGNOSIS — Z953 Presence of xenogenic heart valve: Secondary | ICD-10-CM

## 2023-08-31 DIAGNOSIS — Z7901 Long term (current) use of anticoagulants: Secondary | ICD-10-CM

## 2023-08-31 DIAGNOSIS — I480 Paroxysmal atrial fibrillation: Secondary | ICD-10-CM

## 2023-08-31 LAB — PROTIME INR (PT): INR: 2.5 mg/dL — ABNORMAL LOW (ref 24–44)

## 2023-09-01 ENCOUNTER — Encounter: Admit: 2023-09-01 | Discharge: 2023-09-01 | Payer: MEDICARE

## 2023-09-01 NOTE — Telephone Encounter
Called back and spoke to [place], [facility] with APP recommendations. HHRN verbalized understanding and agreeable with plan. HHRN noted they are discharging pt this Friday as he will be starting cardiac rehab next week. Advised HHRN we will be seeing pt next month for follow up and he can call our clinic if he has any worsening issues before his follow up.

## 2023-09-01 NOTE — Telephone Encounter
McClymont, Janelle L, APRN-NP   to [facility] Nurse Interventional Team [place]   JM    09/01/23  3:54 PM   Continue to monitor for now.  Please have them reach out for any major issues with bleeding.  Janelle  Me   to McClymont, Janelle L, APRN-NP   NL      09/01/23  2:10 PM   Pls review HHRN findings and advise any recs. Pt on warfarin. Thanks!

## 2023-09-01 NOTE — Telephone Encounter
Rec'd call from Amil Amen [facility] nurse at [facility]. HHRN wanted to report she evaluated pt today and noted petechiae on his right leg from the knee down. Pt is s/p TAVR on 10/03. Pt reported to nurse that it is a new finding. Pt has no other sx to report per [facility]. Pt was seen by [facility] APP, Janelle on 10/11. Pt is on warfarin therapy. Routed to [facility] APPs for review and any recommendations.     HHRN return number: [id number].

## 2023-09-04 ENCOUNTER — Encounter: Admit: 2023-09-04 | Discharge: 2023-09-04 | Payer: MEDICARE

## 2023-09-04 DIAGNOSIS — R600 Localized edema: Secondary | ICD-10-CM

## 2023-09-04 DIAGNOSIS — I4819 Other persistent atrial fibrillation: Secondary | ICD-10-CM

## 2023-09-04 DIAGNOSIS — I5042 Chronic combined systolic (congestive) and diastolic (congestive) heart failure: Secondary | ICD-10-CM

## 2023-09-04 MED ORDER — WARFARIN 4 MG PO TAB
4 mg | ORAL_TABLET | Freq: Every day | ORAL | 3 refills | 90.00000 days | Status: AC
Start: 2023-09-04 — End: ?

## 2023-09-04 MED ORDER — BUMETANIDE 2 MG PO TAB
2 mg | ORAL_TABLET | Freq: Two times a day (BID) | ORAL | 3 refills | Status: AC
Start: 2023-09-04 — End: ?

## 2023-09-04 MED ORDER — WARFARIN 1 MG PO TAB
ORAL_TABLET | ORAL | 3 refills | 90.00000 days | Status: AC
Start: 2023-09-04 — End: ?

## 2023-09-04 MED ORDER — CLOPIDOGREL 75 MG PO TAB
75 mg | ORAL_TABLET | Freq: Every day | ORAL | 3 refills | 90.00000 days | Status: AC
Start: 2023-09-04 — End: ?

## 2023-09-15 ENCOUNTER — Encounter: Admit: 2023-09-15 | Discharge: 2023-09-15 | Payer: MEDICARE

## 2023-09-15 DIAGNOSIS — I2510 Atherosclerotic heart disease of native coronary artery without angina pectoris: Secondary | ICD-10-CM

## 2023-09-15 DIAGNOSIS — Z953 Presence of xenogenic heart valve: Secondary | ICD-10-CM

## 2023-09-16 ENCOUNTER — Encounter: Admit: 2023-09-16 | Discharge: 2023-09-16 | Payer: MEDICARE

## 2023-09-16 DIAGNOSIS — I493 Ventricular premature depolarization: Secondary | ICD-10-CM

## 2023-09-16 DIAGNOSIS — I480 Paroxysmal atrial fibrillation: Secondary | ICD-10-CM

## 2023-09-16 DIAGNOSIS — Z7901 Long term (current) use of anticoagulants: Secondary | ICD-10-CM

## 2023-09-16 DIAGNOSIS — Z953 Presence of xenogenic heart valve: Secondary | ICD-10-CM

## 2023-09-16 LAB — PROTIME INR (PT): INR: 3

## 2023-09-19 ENCOUNTER — Encounter: Admit: 2023-09-19 | Discharge: 2023-09-19 | Payer: MEDICARE

## 2023-09-21 ENCOUNTER — Encounter: Admit: 2023-09-21 | Discharge: 2023-09-21 | Payer: MEDICARE

## 2023-10-02 ENCOUNTER — Encounter: Admit: 2023-10-02 | Discharge: 2023-10-02 | Payer: MEDICARE

## 2023-10-05 ENCOUNTER — Encounter: Admit: 2023-10-05 | Discharge: 2023-10-05 | Payer: MEDICARE

## 2023-10-05 DIAGNOSIS — Z7901 Long term (current) use of anticoagulants: Secondary | ICD-10-CM

## 2023-10-05 DIAGNOSIS — I493 Ventricular premature depolarization: Secondary | ICD-10-CM

## 2023-10-05 DIAGNOSIS — Z953 Presence of xenogenic heart valve: Secondary | ICD-10-CM

## 2023-10-05 DIAGNOSIS — I480 Paroxysmal atrial fibrillation: Secondary | ICD-10-CM

## 2023-10-08 ENCOUNTER — Encounter: Admit: 2023-10-08 | Discharge: 2023-10-08 | Payer: MEDICARE

## 2023-10-08 DIAGNOSIS — Z7901 Long term (current) use of anticoagulants: Secondary | ICD-10-CM

## 2023-10-08 DIAGNOSIS — I480 Paroxysmal atrial fibrillation: Secondary | ICD-10-CM

## 2023-10-08 DIAGNOSIS — I493 Ventricular premature depolarization: Secondary | ICD-10-CM

## 2023-10-08 DIAGNOSIS — Z953 Presence of xenogenic heart valve: Secondary | ICD-10-CM

## 2023-10-09 ENCOUNTER — Encounter: Admit: 2023-10-09 | Discharge: 2023-10-09 | Payer: MEDICARE

## 2023-10-09 ENCOUNTER — Ambulatory Visit: Admit: 2023-10-09 | Discharge: 2023-10-10 | Payer: MEDICARE

## 2023-10-09 ENCOUNTER — Ambulatory Visit: Admit: 2023-10-09 | Discharge: 2023-10-09 | Payer: MEDICARE

## 2023-10-09 DIAGNOSIS — I2510 Atherosclerotic heart disease of native coronary artery without angina pectoris: Secondary | ICD-10-CM

## 2023-10-09 DIAGNOSIS — Z953 Presence of xenogenic heart valve: Secondary | ICD-10-CM

## 2023-10-09 DIAGNOSIS — I5042 Chronic combined systolic (congestive) and diastolic (congestive) heart failure: Secondary | ICD-10-CM

## 2023-10-09 DIAGNOSIS — R0989 Other specified symptoms and signs involving the circulatory and respiratory systems: Secondary | ICD-10-CM

## 2023-10-09 DIAGNOSIS — I480 Paroxysmal atrial fibrillation: Secondary | ICD-10-CM

## 2023-10-09 DIAGNOSIS — I495 Sick sinus syndrome: Secondary | ICD-10-CM

## 2023-10-09 DIAGNOSIS — I350 Nonrheumatic aortic (valve) stenosis: Secondary | ICD-10-CM

## 2023-10-09 DIAGNOSIS — Z950 Presence of cardiac pacemaker: Secondary | ICD-10-CM

## 2023-10-09 NOTE — Progress Notes
Date of Service: 10/09/2023    Robert Mercado is a 77 y.o. male.       HPI       I had the pleasure of seeing Robert Mercado for 1 month post TAVR follow-up.  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  He developed severe low-flow low gradient aortic stenosis and was referred to the valve clinic for consideration of TAVR and felt to be a candidate. He was admitted in February to an outside hospital for chest discomfort. His troponin was elevated consistent with NSTEMI. He had a cardiac catheterization that showed 90% stenosis to the ramus. There was attempt at PCI but it was unsuccessful. He was readmitted a few days later with recurrent chest discomfort. PCI to the ramus with was performed with a single drug-eluting stent.      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  He tolerated the procedure well without complications.  His heart rhythm remained stable.  A postoperative echocardiogram revealed an EF of 50 to 55% with a well-seated 29 SAPIEN S3 valve in the aortic position, mean gradient 6.5 mmHg without stenosis or regurgitation.  He was discharged home on postoperative day #1.    He has been doing well since discharge.  He is limited by his hip therefore is not very active but is participating cardiac rehab currently.  He denies shortness of breath.  He denies chest pain, orthopnea, near-syncope or syncope.  He does get dizzy with position changes.  He also has chronic lower extremity edema that has been stable.            Vitals:    10/09/23 1519   BP: 100/64   BP Source: Arm, Left Upper   Pulse: 70   PainSc: Zero   Weight: 124.7 kg (275 lb)   Height: 182.9 cm (6')     Body mass index is 37.3 kg/m?Marland Kitchen     Past Medical History  Patient Active Problem List    Diagnosis Date Noted    S/p TAVR (transcatheter aortic valve replacement), bioprosthetic 08/20/2023     08/20/23: Dr. Sindy Messing and Dr. Paris Lore      Hypokalemia 08/20/2023    Hypocalcemia 08/20/2023    Nonrheumatic aortic valve stenosis 07/28/2023    Renal mass 07/28/2023    Chronic combined systolic and diastolic congestive heart failure, NYHA class 2 (HCC) 04/30/2023    Medication side effects 08/11/2022     While hospitalized in September 2023 patient was initiated on amiodarone for rate control in the setting of atrial fibrillation.  He developed an extensive, generalized micropapular rash thought to be due to amiodarone.      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 Mackey Birchwood: 30% instent restenosis of previously placed stent in the LAD, and patent 2nd DIAG stent and 60% distal LAD disease. Medical management  11/20/2016 - Cardiac Catheterization:  [facility])  Moderately depressed LV function, ejection fraction 30% to 40% with global hypokinesis and no mitral regurgitation, well compensated (EDP equals 10).  Noncritical coronary disease as noted; left main 0% stenosis, LAD proximal  0%, mid 30%, circumflex 0%, RCA (dominant) 0%.   10/19/2020 - Cardiac Catheterization:  [facility])  Mid LAD lesion is 55% stenosed. IFR does not indicate any hemodynamically significant disease.  1st Diag lesion is 70% stenosed. Small to medium size.  2nd Diag lesion is 50% stenosed. Small-vessel.  There is mild left ventricular systolic dysfunction. The ejection fraction is 45-50% with apical wall hypokinesis.  04/17/2022 - [facility] MPI:   This study is normal with no evidence of significant myocardial ischemia. Left ventricular systolic function is low normal. There are no high risk prognostic indicators present.  The pharmacologic ECG portion of the study is negative for ischemia.  Comparison is made with a prior ADAC study completed 07/09/2018.  Ejection fraction was 46%.  Compared to the previous study left ventricular ejection fraction is increased, no change in perfusion findings.  In aggregate the current study is low risk in regards to predicted annual cardiovascular mortality rate.  01/09/2023 - Cardiac Catheterization:  ([facility])  Unsuccessful PCI of the subdivision of the ramus intermedius secondary to difficulty with access and inability to adequately cannulate the left main trunk   01/15/2023 - Cardiac Catheterization:  ([facility])  Successful PTCA/DES of ramus intermedius with reduction in stenosis from 90% to 0%.       Peripheral neuropathy 11/12/2009         Review of Systems   Constitutional: Negative.   HENT: Negative.     Eyes: Negative.    Cardiovascular: Negative.    Respiratory: Negative.     Endocrine: Negative.    Hematologic/Lymphatic: Negative.    Skin: Negative.    Musculoskeletal: Negative.    Gastrointestinal: Negative.    Genitourinary: Negative.    Neurological: Negative.    Psychiatric/Behavioral: Negative.     Allergic/Immunologic: Negative. Physical Exam  General Appearance: no acute distress  Skin: warm & intact  HEENT: unremarkable  Neck Veins: neck veins are flat & not distended  Carotid Arteries: no bruits  Chest Inspection: chest is normal in appearance  Auscultation/Percussion: lungs clear to auscultation, no rales, rhonchi, or wheezing  Cardiac Rhythm: regular rhythm & normal rate  Cardiac Auscultation: Normal S1 & S2, no S3 or S4, no rub  Murmurs: no cardiac murmurs   Extremities: 2+ bilateral lower extremity edema; 2+ symmetric distal pulses  Abdominal Exam: soft, non-tender, no masses, bowel sounds normal  Liver & Spleen: no organomegaly  Neurologic Exam: oriented to time, place and person; no focal neurologic deficits  Psychiatric: Normal mood and affect.  Behavior is normal. Judgment and thought content normal.       Cardiovascular Studies  Preliminary EKG:    A-paced, rate 70 bpm.  RBBB,  LAFB    Cardiovascular Health Factors  Vitals BP Readings from Last 3 Encounters:   10/09/23 100/64   10/09/23 104/59   08/28/23 120/70     Wt Readings from Last 3 Encounters:   10/09/23 124.7 kg (275 lb)   10/09/23 124.7 kg (275 lb)   08/28/23 124.7 kg (275 lb)     BMI Readings from Last 3 Encounters:   10/09/23 37.30 kg/m?   10/09/23 37.30 kg/m?   08/28/23 37.30 kg/m?      Smoking Social History     Tobacco Use   Smoking Status Never   Smokeless Tobacco Never      Lipid Profile Cholesterol   Date Value Ref Range Status   07/29/2022 117 <200 MG/DL Final     HDL   Date Value Ref Range Status   07/29/2022 43 >40 MG/DL Final     LDL   Date Value Ref Range Status   07/29/2022 61 <100 mg/dL Final     Triglycerides   Date Value Ref Range Status   07/29/2022 74 <150 MG/DL Final      Blood Sugar Hemoglobin A1C   Date Value Ref Range Status   08/17/2023 5.5 4.0 - 5.7 % Final     Comment:     The ADA recommends that most patients with type 1 and type 2 diabetes maintain   an A1c level <7%.       Glucose   Date Value Ref Range Status   08/21/2023 118 (H) 70 - 100 MG/DL Final   62/13/0865 784 (H) 70 - 100 MG/DL Final   69/62/9528 92 70 - 100 MG/DL Final          Problems Addressed Today  Encounter Diagnoses   Name Primary?    Nonrheumatic aortic valve stenosis Yes    S/p TAVR (transcatheter aortic valve replacement), bioprosthetic     Cardiovascular symptoms     SSS (sick sinus syndrome) (HCC)     Chronic combined systolic and diastolic congestive heart failure, NYHA class 2 (HCC)     Coronary artery disease involving native coronary artery of native heart without angina pectoris     Cardiac pacemaker in situ     Paroxysmal atrial fibrillation (HCC)        Assessment and Plan       1.  Aortic stenosis status post TAVR with a 29 SAPIEN valve.  He had an echocardiogram today which is pending however on preliminary review the valve is well-seated.  The gradient is 4 mmHg.  He needs to continue to increase his activity and participate in cardiac rehab.  He needs to continue SBE prophylaxis lifelong.    2.  Chronic systolic and diastolic heart failure.  He is NYHA class II symptoms.  He is well compensated on exam.  He is on Bumex twice a day.    3.  Coronary artery disease.  He has had prior PCI.  He is on Plavix.  He denies anginal symptoms.    4.  Paroxysmal A-fib.  He is on dronedarone and warfarin.    5.  Sinus node dysfunction with pacemaker in place.    He is scheduled to follow-up with Dr. Avie Arenas in December.  We will see him in a year with an echo as required by the TVT registry.  Thank you for allowing [place] to participate in the care of this pleasant individual.  If you have any other questions or concerns, please do not hesitate to contact  us.    J. Norman Clay, APRN  Structural Heart Nurse Practitioner           Current Medications (including today's revisions)   acetaminophen (TYLENOL EXTRA STRENGTH) 500 mg tablet Take one tablet to two tablets by mouth every 6 hours as needed. Indications: pain    atorvastatin (LIPITOR) 80 mg tablet Take one tablet by mouth daily. (Patient taking differently: Take one tablet by mouth at bedtime daily.)    bumetanide (BUMEX) 2 mg tablet Take one tablet by mouth twice daily. Indications: accumulation of fluid resulting from chronic heart failure    clopiDOGreL (PLAVIX) 75 mg tablet Take one tablet by mouth daily.    coenzyme Q10 200 mg capsule Take one capsule by mouth at bedtime daily.    dilTIAZem CD (CARDIZEM CD) 180 mg capsule Take one capsule by mouth twice daily. Indications: paroxysmal supraventricular tachycardia    diphenhydrAMINE HCL (BENADRYL) 25 mg capsule Take one capsule by mouth every 6 hours as needed. Indications: hives (Patient taking differently: Take one capsule by mouth at bedtime as needed. Indications: hives)    dronedarone (MULTAQ) 400 mg tablet Take one tablet by mouth twice daily with meals.    ferrous sulfate (FEOSOL) 325 mg (65 mg iron) tablet Take one tablet by mouth daily. Take on an empty stomach at least 1 hour before or 2 hours after food.    finasteride (PROSCAR) 5 mg tablet Take one tablet by mouth at bedtime daily.    fluticasone (FLONASE) 50 mcg/actuation nasal spray Apply two sprays to each nostril as directed as Needed. Shake bottle gently before using.    gabapentin (NEURONTIN) 300 mg capsule Take three capsules by mouth three times daily.    HYDROcodone/acetaminophen (NORCO) 10/325 mg tablet Take one tablet by mouth three times daily as needed for Pain.    isosorbide mononitrate ER (IMDUR) 30 mg tablet,extended release 24 hr Take one tablet by mouth every morning.    MELATONIN PO Take 12 mg by mouth at bedtime daily.    metoprolol succinate XL (TOPROL XL) 25 mg extended release tablet Take one tablet by mouth twice daily. Indications: high blood pressure    nitroglycerin (NITROSTAT) 0.4 mg tablet Take one tablet sublingually as needed every 5 min for chest pain X three    nystatin (NYSTOP) 100,000 unit/g topical powder Apply  topically to affected area four times daily.    pantoprazole DR (PROTONIX) 40 mg tablet Take one tablet by mouth daily.    polyethylene glycol 3350 (MIRALAX) 17 gram/dose powder Take seventeen g by mouth daily as needed for Constipation.    potassium chloride SR (KLOR-CON M20) 20 mEq tablet Take two tablets by mouth daily. (Patient taking differently: Take two tablets by mouth daily as needed.)    vit A/vit C/vit E/zinc/copper (PRESERVISION AREDS PO) Take 1 tablet by mouth twice daily.    warfarin (COUMADIN) 1 mg tablet Take 1-2 tablets by mouth in combination with 4 mg tablets as directed by PCP    warfarin (COUMADIN) 4 mg tablet Take one tablet by mouth daily. Take as directed by Physician to keep INR therapeutic.

## 2023-10-12 ENCOUNTER — Encounter: Admit: 2023-10-12 | Discharge: 2023-10-12 | Payer: MEDICARE

## 2023-10-18 LAB — PROTIME INR (PT): INR: 2.2

## 2023-10-19 ENCOUNTER — Encounter: Admit: 2023-10-19 | Discharge: 2023-10-19 | Payer: MEDICARE

## 2023-10-19 DIAGNOSIS — I493 Ventricular premature depolarization: Secondary | ICD-10-CM

## 2023-10-19 DIAGNOSIS — Z953 Presence of xenogenic heart valve: Secondary | ICD-10-CM

## 2023-10-19 DIAGNOSIS — I480 Paroxysmal atrial fibrillation: Secondary | ICD-10-CM

## 2023-10-19 DIAGNOSIS — Z7901 Long term (current) use of anticoagulants: Secondary | ICD-10-CM

## 2023-10-21 ENCOUNTER — Encounter: Admit: 2023-10-21 | Discharge: 2023-10-21 | Payer: MEDICARE

## 2023-10-22 ENCOUNTER — Encounter: Admit: 2023-10-22 | Discharge: 2023-10-22 | Payer: MEDICARE

## 2023-10-22 ENCOUNTER — Ambulatory Visit: Admit: 2023-10-22 | Discharge: 2023-10-23 | Payer: MEDICARE

## 2023-10-25 LAB — PROTIME INR (PT): INR: 1.8 (ref 2.0–3.0)

## 2023-10-26 ENCOUNTER — Encounter: Admit: 2023-10-26 | Discharge: 2023-10-26 | Payer: MEDICARE

## 2023-10-26 DIAGNOSIS — I493 Ventricular premature depolarization: Secondary | ICD-10-CM

## 2023-10-26 DIAGNOSIS — I480 Paroxysmal atrial fibrillation: Secondary | ICD-10-CM

## 2023-10-26 DIAGNOSIS — Z7901 Long term (current) use of anticoagulants: Secondary | ICD-10-CM

## 2023-10-26 DIAGNOSIS — Z953 Presence of xenogenic heart valve: Secondary | ICD-10-CM

## 2023-10-26 MED ORDER — MULTAQ 400 MG PO TAB
400 mg | ORAL_TABLET | Freq: Two times a day (BID) | ORAL | 1 refills | Status: AC
Start: 2023-10-26 — End: ?

## 2023-10-26 NOTE — Telephone Encounter
Tried to submit PA through covermymeds, unable to verify eligibility. Spoke to patient, no longer using [facility] refill sent to preferred pharmacy on file. Reviewed plan with the patient. Patient verbalized understanding and does not have any further questions or concerns. No further education requested from patient. Patient has our contact information for future needs.

## 2023-10-26 NOTE — Telephone Encounter
-----   Message from Botkins D sent at 10/26/2023  7:19 AM CST -----  Regarding: PA Needed -MPE Multaq  In MPE Review RF Folder

## 2023-10-29 ENCOUNTER — Ambulatory Visit: Admit: 2023-10-29 | Discharge: 2023-10-30 | Payer: MEDICARE

## 2023-10-29 ENCOUNTER — Encounter: Admit: 2023-10-29 | Discharge: 2023-10-29 | Payer: MEDICARE

## 2023-10-29 DIAGNOSIS — T887XXA Unspecified adverse effect of drug or medicament, initial encounter: Secondary | ICD-10-CM

## 2023-10-29 DIAGNOSIS — I444 Left anterior fascicular block: Secondary | ICD-10-CM

## 2023-10-29 DIAGNOSIS — I480 Paroxysmal atrial fibrillation: Secondary | ICD-10-CM

## 2023-10-29 DIAGNOSIS — I952 Hypotension due to drugs: Secondary | ICD-10-CM

## 2023-10-29 DIAGNOSIS — I350 Nonrheumatic aortic (valve) stenosis: Secondary | ICD-10-CM

## 2023-10-29 DIAGNOSIS — Z950 Presence of cardiac pacemaker: Secondary | ICD-10-CM

## 2023-10-29 DIAGNOSIS — I493 Ventricular premature depolarization: Secondary | ICD-10-CM

## 2023-10-29 DIAGNOSIS — Z136 Encounter for screening for cardiovascular disorders: Secondary | ICD-10-CM

## 2023-10-29 DIAGNOSIS — U071 COVID-19 virus infection: Secondary | ICD-10-CM

## 2023-10-29 DIAGNOSIS — G609 Hereditary and idiopathic neuropathy, unspecified: Secondary | ICD-10-CM

## 2023-10-29 DIAGNOSIS — I495 Sick sinus syndrome: Secondary | ICD-10-CM

## 2023-10-29 DIAGNOSIS — E782 Mixed hyperlipidemia: Secondary | ICD-10-CM

## 2023-10-29 DIAGNOSIS — I5042 Chronic combined systolic (congestive) and diastolic (congestive) heart failure: Secondary | ICD-10-CM

## 2023-10-29 DIAGNOSIS — G4733 Obstructive sleep apnea (adult) (pediatric): Secondary | ICD-10-CM

## 2023-10-29 DIAGNOSIS — Z7901 Long term (current) use of anticoagulants: Secondary | ICD-10-CM

## 2023-10-29 DIAGNOSIS — Z953 Presence of xenogenic heart valve: Secondary | ICD-10-CM

## 2023-10-29 DIAGNOSIS — N2889 Other specified disorders of kidney and ureter: Secondary | ICD-10-CM

## 2023-10-29 DIAGNOSIS — I2510 Atherosclerotic heart disease of native coronary artery without angina pectoris: Secondary | ICD-10-CM

## 2023-10-29 NOTE — Progress Notes
Date of Service: 10/29/2023    Armour Silver is a 77 y.o. male.       HPI     Shareef Gangl is a 77 y.o. male with a history of acute on chronic HFrEF, recovered LVEF, ischemic cardiomyopathy, PAF and also history of atrial flutter anticoagulated with warfarin, chronic bilateral lower extremity edema due to HFpEF, chronic anemia, amiodarone allergy (while an inpatient at [facility] in September 2023 patient received amiodarone IV for heart rate-rhythm control in the setting of atrial fibrillation with RVR, he developed an extensive morbilliform rash that subsequently resolved), history of non-ST MI, status post PCI of the ramus intermedius in February 2024, severe aortic valve stenosis, 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  Patient is on extensive cardiac regimen that includes statin therapy, loop diuretic, clopidogrel, diltiazem and metoprolol for rate control, dronedarone, potassium replacement.    He does not report having any symptoms of chest pain, no heart palpitations, no presyncope or syncope.    He has been experiencing significant left hip pain, he was evaluated to undergo hip replacement, however meantime patient was diagnosed with severe aortic valve stenosis and had to undergo TAVR.  He is currently interested in further seeking orthopedic consultation to undergo hip replacement.    Most recently, patient was evaluated with the following:    2D echo Doppler study dated 10/07/2023-LVEF 55%, moderate biatrial enlargement, estimated PAP = 30 mmHg, mild to moderate TR, normal TAVR MG = 5 mmHg.  Perfusion imaging study dated 10/22/2023-no ischemia, LVEF = 53%.  PPM check dated 10/23/2023-rhythm a paced-V sensed, 60 bpm, no significant arrhythmias, normal device function. Vitals:    10/29/23 0853   BP: 102/70   BP Source: Arm, Left Upper   Pulse: 77   SpO2: 98%   O2 Device: None (Room air)   PainSc: Six   Weight: 125.9 kg (277 lb 9.6 oz)     Body mass index is 37.65 kg/m?Marland Kitchen     Past Medical History  Patient Active Problem List    Diagnosis Date Noted    S/p TAVR (transcatheter aortic valve replacement), bioprosthetic 08/20/2023     08/20/23: Dr. Sindy Messing and Dr. Paris Lore, # 29 Sapien S3      Hypokalemia 08/20/2023    Hypocalcemia 08/20/2023    Nonrheumatic aortic valve stenosis 07/28/2023    Renal mass 07/28/2023    Chronic combined systolic and diastolic congestive heart failure, NYHA class 2 (HCC) 04/30/2023    Medication side effects 08/11/2022     While hospitalized in September 2023 patient was initiated on amiodarone for rate control in the setting of atrial fibrillation.  He developed an extensive, generalized micropapular rash thought to be due to amiodarone.      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 Mackey Birchwood: 30% instent restenosis of previously placed stent in the LAD, and patent 2nd DIAG stent and 60% distal LAD disease. Medical management  11/20/2016 - Cardiac Catheterization:  [facility])  Moderately depressed LV function, ejection fraction 30% to 40% with global hypokinesis and no mitral regurgitation, well compensated (EDP equals 10).  Noncritical coronary disease as noted; left main 0% stenosis, LAD proximal  0%, mid 30%, circumflex 0%, RCA (dominant) 0%.   10/19/2020 - Cardiac Catheterization:  [facility])  Mid LAD lesion is 55% stenosed. IFR does not indicate any hemodynamically significant disease.  1st Diag lesion is 70% stenosed. Small to medium size.  2nd Diag lesion is 50% stenosed. Small-vessel.  There is mild left ventricular systolic dysfunction. The ejection fraction is 45-50% with apical wall hypokinesis.  04/17/2022 - [facility] MPI:   This study is normal with no evidence of significant myocardial ischemia. Left ventricular systolic function is low normal. There are no high risk prognostic indicators present.  The pharmacologic ECG portion of the study is negative for ischemia.  Comparison is made with a prior ADAC study completed 07/09/2018.  Ejection fraction was 46%.  Compared to the previous study left ventricular ejection fraction is increased, no change in perfusion findings.  In aggregate the current study is low risk in regards to predicted annual cardiovascular mortality rate.  01/09/2023 - Cardiac Catheterization:  ([facility])  Unsuccessful PCI of the subdivision of the ramus intermedius secondary to difficulty with access and inability to adequately cannulate the left main trunk   01/15/2023 - Cardiac Catheterization:  ([facility])  Successful PTCA/DES of ramus intermedius with reduction in stenosis from 90% to 0%.       Peripheral neuropathy 11/12/2009         Review of Systems   Constitutional: Negative.   HENT: Negative.     Eyes: Negative.    Cardiovascular: Negative.    Respiratory: Negative.     Endocrine: Negative.    Hematologic/Lymphatic: Negative.    Skin: Negative.    Musculoskeletal: Negative.    Gastrointestinal: Negative.    Genitourinary: Negative.    Neurological: Negative.    Psychiatric/Behavioral: Negative.     Allergic/Immunologic: Negative.        Physical Exam  General Appearance: normal in appearance  Skin: warm, moist, no ulcers or xanthomas  Eyes: conjunctivae and lids normal, pupils are equal and round  Lips & Oral Mucosa: no pallor or cyanosis  Neck Veins: neck veins are flat, neck veins are not distended  Chest Inspection: chest is normal in appearance  Respiratory Effort: breathing comfortably, no respiratory distress  Auscultation/Percussion: lungs clear to auscultation, no rales or rhonchi, no wheezing  Cardiac Rhythm: regular rhythm and normal rate  Cardiac Auscultation: S1, S2 normal, no rub, no gallop  Murmurs: 2/6 systolic murmur   Carotid Arteries: normal carotid upstroke bilaterally, no bruit  Lower Extremity Edema: no lower extremity edema  Abdominal Exam: soft, non-tender, no masses, bowel sounds normal  Liver & Spleen: no organomegaly  Language and Memory: patient responsive and seems to comprehend information  Neurologic Exam: neurological assessment grossly intact  Cardiovascular Studies  Twelve-lead EKG demonstrates a paced V sensed rhythm, RBBB, left axis deviation, ventricular rate 67 bpm.    Cardiovascular Health Factors  Vitals BP Readings from Last 3 Encounters:   10/29/23 102/70   10/09/23 104/59   10/09/23 100/64     Wt Readings from Last 3 Encounters:   10/29/23 125.9 kg (277 lb 9.6 oz)   10/09/23 124.7 kg (275 lb)   10/09/23 124.7 kg (275 lb)     BMI Readings from Last 3 Encounters:   10/29/23 37.65 kg/m?   10/09/23 37.30 kg/m?   10/09/23 37.30 kg/m?      Smoking Social History     Tobacco Use   Smoking Status Never   Smokeless Tobacco Never      Lipid Profile Cholesterol   Date Value Ref Range Status   07/29/2022 117 <200 MG/DL Final     HDL   Date Value Ref Range Status   07/29/2022 43 >40 MG/DL Final     LDL   Date Value Ref Range Status   07/29/2022 61 <100 mg/dL Final     Triglycerides   Date Value Ref Range Status   07/29/2022 74 <150 MG/DL Final      Blood Sugar Hemoglobin A1C   Date Value Ref Range Status   08/17/2023 5.5 4.0 - 5.7 % Final     Comment:     The ADA recommends that most patients with type 1 and type 2 diabetes maintain   an A1c level <7%.       Glucose   Date Value Ref Range Status   10/21/2023 99  Final   08/21/2023 118 (H) 70 - 100 MG/DL Final   62/37/6283 151 (H) 70 - 100 MG/DL Final          Problems Addressed Today  Encounter Diagnoses   Name Primary?    Coronary artery disease involving native coronary artery of native heart without angina pectoris Yes    Chronic combined systolic and diastolic congestive heart failure, NYHA class 2 (HCC)     Mixed hyperlipidemia     Hypotension due to drugs     LAFB (left anterior fascicular block)     Nonrheumatic aortic valve stenosis     Paroxysmal atrial fibrillation (HCC)     PVC's (premature ventricular contractions)     S/p TAVR (transcatheter aortic valve replacement), bioprosthetic     SSS (sick sinus syndrome) (HCC)     Cardiac pacemaker in situ     Chronic anticoagulation, on warfarin     COVID-19 virus infection     Medication side effects     OSA (obstructive sleep apnea)     Hereditary peripheral neuropathy     Screening for heart disease     Renal mass        Assessment and Plan     Assessment:    1.  Coronary artery disease  PTCA of LAD in April 2010  PTCA of diagonal branch in February 2013  Status post LHC in 10/2020 in [facility] that time findings were consistent with mid LAD lesion 55%, IFR did not indicate any hemodynamically significant stenosis, D1 lesion 70% stenosis, this is known from before and this is a small to medium size vessel, D2 50% stenosis is a small vessel  2.  History of non-ST MI in February 2024   Admitted at [facility] in [place], [place]  Unsuccessful attempt of opening the ramus intermedius  Repeated recurrent chest  pain, status post readmission at [facility] on 01/13/2023  Status post PCI of the RI-patient continues on dual antiplatelet therapy  3.  Severe aortic valve stenosis, status post TAVR on 08/20/2023  The most recent echocardiogram dated 10/09/2023 demonstrated normal gradient = 5 mmHg, patient received #29 SAPIEN S3 valve  4.  Relative hypotension  Patient is on a comprehensive cardiac regimen including isosorbide mononitrate, diltiazem and metoprolol (both for rate control of atrial fibrillation)  5.  Persistent atrial fibrillation  Previously on sotalol  Intolerance to amiodarone due to allergic reaction while an inpatient at [facility] in September 2023  Currently on dronedarone  6.  History of atrial flutter  No recurrence, this was previously demonstrated on EKGs and PPM checks  7.  History of sick sinus syndrome, status post PPM implant in July 2011  8.  History of PVCs, status post RFA in 2018 without any significant recurrence  9.  OSA-uses CPAP machine  10.  Osteoarthritis, significant left hip pain-patient is interested in pursuing orthopedic evaluation and further surgical intervention  11.  Incidental diagnosis of left renal mass  Patient was evaluated in the urology department at [facility], he decided to pursue surveillance and conservative treatment for the time being  12.  Reoperative evaluation preceding orthopedic surgical intervention    Plan:    1.  Discontinue isosorbide mononitrate due to relative hypotension recorded today in the office  2.  Continue all other medications  3.  I did asked the patient to continue a low-salt diet, he can take another half tablet of bumetanide or even a full tablet for lower extremity edema  4. Patient's Revised Cardiac Risk Index  is moderate for a low oh risk surgery.   Estimated risk of adverse outcomes (perioperative/postoperative events including myocardial infarction, pulmonary edema, ventricular fibrillation, cardiac arrest or complete heart block) with noncardiac surgery is  ~7% from a cardiac standpoint. This patient does not have any contraindication to undergo general anesthesia and the planned surgical procedure.  The full risks and benefits will have to be discussed with the Anesthesiologist  and the Surgeon that will perform the procedure.     4.  2D echo Doppler study preceding the next office visit in approximately 6 months  6.  Patient will continue on clopidogrel and warfarin for the time being  In February 2025 at the 1 year anniversary of the stent, he will come off clopidogrel, he can continue aspirin 81 mg p.o. daily along with the warfarin  7.  If patient will undergo orthopedic intervention we can manage the warfarin perioperatively.  8.  Patient will continue to follow-up with urology and orthopedic department at [facility] in [place], [place].        Total Time Today was 40 minutes in the following activities: Preparing to see the patient, Obtaining and/or reviewing separately obtained history, Performing a medically appropriate examination and/or evaluation, Counseling and educating the patient/family/caregiver, Ordering medications, tests, or procedures, Referring and communication with other health care professionals (when not separately reported), Documenting clinical information in the electronic or other health record, Independently interpreting results (not separately reported) and communicating results to the patient/family/caregiver, and Care coordination (not separately reported)          Current Medications (including today's revisions)   acetaminophen (TYLENOL EXTRA STRENGTH) 500 mg tablet Take one tablet to two tablets by mouth every 6 hours as needed. Indications: pain    atorvastatin (LIPITOR) 80 mg tablet Take one tablet by mouth  daily. (Patient taking differently: Take one tablet by mouth at bedtime daily.)    bumetanide (BUMEX) 2 mg tablet Take one tablet by mouth twice daily. Indications: accumulation of fluid resulting from chronic heart failure    clopiDOGreL (PLAVIX) 75 mg tablet Take one tablet by mouth daily.    coenzyme Q10 200 mg capsule Take one capsule by mouth at bedtime daily.    dilTIAZem CD (CARDIZEM CD) 180 mg capsule Take one capsule by mouth twice daily. Indications: paroxysmal supraventricular tachycardia    diphenhydrAMINE HCL (BENADRYL) 25 mg capsule Take one capsule by mouth every 6 hours as needed. Indications: hives (Patient taking differently: Take one capsule by mouth at bedtime as needed. Indications: hives)    dronedarone (MULTAQ) 400 mg tablet Take one tablet by mouth twice daily with meals.    ferrous sulfate (FEOSOL) 325 mg (65 mg iron) tablet Take one tablet by mouth daily. Take on an empty stomach at least 1 hour before or 2 hours after food.    finasteride (PROSCAR) 5 mg tablet Take one tablet by mouth at bedtime daily.    fluticasone (FLONASE) 50 mcg/actuation nasal spray Apply two sprays to each nostril as directed as Needed. Shake bottle gently before using.    gabapentin (NEURONTIN) 300 mg capsule Take three capsules by mouth three times daily.    HYDROcodone/acetaminophen (NORCO) 10/325 mg tablet Take one tablet by mouth three times daily as needed for Pain.    isosorbide mononitrate ER (IMDUR) 30 mg tablet,extended release 24 hr Take one tablet by mouth every morning.    MELATONIN PO Take 12 mg by mouth at bedtime daily.    metoprolol succinate XL (TOPROL XL) 25 mg extended release tablet Take one tablet by mouth twice daily. Indications: high blood pressure    nitroglycerin (NITROSTAT) 0.4 mg tablet Take one tablet sublingually as needed every 5 min for chest pain X three    nystatin (NYSTOP) 100,000 unit/g topical powder Apply  topically to affected area four times daily.    pantoprazole DR (PROTONIX) 40 mg tablet Take one tablet by mouth daily.    polyethylene glycol 3350 (MIRALAX) 17 gram/dose powder Take seventeen g by mouth daily as needed for Constipation.    potassium chloride SR (KLOR-CON M20) 20 mEq tablet Take two tablets by mouth daily. (Patient taking differently: Take two tablets by mouth daily as needed.)    vit A/vit C/vit E/zinc/copper (PRESERVISION AREDS PO) Take 1 tablet by mouth twice daily.    warfarin (COUMADIN) 1 mg tablet Take 1-2 tablets by mouth in combination with 4 mg tablets as directed by PCP    warfarin (COUMADIN) 4 mg tablet Take one tablet by mouth daily. Take as directed by Physician to keep INR therapeutic.

## 2023-11-02 ENCOUNTER — Encounter: Admit: 2023-11-02 | Discharge: 2023-11-02 | Payer: MEDICARE

## 2023-11-05 ENCOUNTER — Encounter: Admit: 2023-11-05 | Discharge: 2023-11-05 | Payer: MEDICARE

## 2023-11-08 ENCOUNTER — Encounter: Admit: 2023-11-08 | Discharge: 2023-11-08 | Payer: MEDICARE

## 2023-11-08 LAB — PROTIME INR (PT): INR: 4.6 10*3/uL — ABNORMAL HIGH (ref 0.00–0.45)

## 2023-11-09 ENCOUNTER — Encounter: Admit: 2023-11-09 | Discharge: 2023-11-09 | Payer: MEDICARE

## 2023-11-09 DIAGNOSIS — Z7901 Long term (current) use of anticoagulants: Secondary | ICD-10-CM

## 2023-11-09 DIAGNOSIS — Z953 Presence of xenogenic heart valve: Secondary | ICD-10-CM

## 2023-11-09 DIAGNOSIS — I493 Ventricular premature depolarization: Secondary | ICD-10-CM

## 2023-11-09 DIAGNOSIS — I480 Paroxysmal atrial fibrillation: Secondary | ICD-10-CM

## 2023-11-10 ENCOUNTER — Encounter: Admit: 2023-11-10 | Discharge: 2023-11-10 | Payer: MEDICARE

## 2023-11-10 DIAGNOSIS — Z7901 Long term (current) use of anticoagulants: Secondary | ICD-10-CM

## 2023-11-10 DIAGNOSIS — Z953 Presence of xenogenic heart valve: Secondary | ICD-10-CM

## 2023-11-10 DIAGNOSIS — I493 Ventricular premature depolarization: Secondary | ICD-10-CM

## 2023-11-10 DIAGNOSIS — I480 Paroxysmal atrial fibrillation: Secondary | ICD-10-CM

## 2023-11-10 LAB — PROTIME INR (PT): INR: 3.8 — ABNORMAL HIGH

## 2023-11-12 ENCOUNTER — Encounter: Admit: 2023-11-12 | Discharge: 2023-11-12 | Payer: MEDICARE

## 2023-11-13 ENCOUNTER — Encounter: Admit: 2023-11-13 | Discharge: 2023-11-13 | Payer: MEDICARE

## 2023-11-17 ENCOUNTER — Encounter: Admit: 2023-11-17 | Discharge: 2023-11-17 | Payer: MEDICARE

## 2023-11-17 DIAGNOSIS — Z953 Presence of xenogenic heart valve: Secondary | ICD-10-CM

## 2023-11-17 DIAGNOSIS — Z7901 Long term (current) use of anticoagulants: Secondary | ICD-10-CM

## 2023-11-17 DIAGNOSIS — I480 Paroxysmal atrial fibrillation: Secondary | ICD-10-CM

## 2023-11-17 DIAGNOSIS — I493 Ventricular premature depolarization: Secondary | ICD-10-CM

## 2023-11-17 LAB — PROTIME INR (PT): INR: 1.5

## 2023-11-23 ENCOUNTER — Encounter: Admit: 2023-11-23 | Discharge: 2023-11-23 | Payer: MEDICARE

## 2023-11-23 DIAGNOSIS — I480 Paroxysmal atrial fibrillation: Secondary | ICD-10-CM

## 2023-11-23 DIAGNOSIS — Z7901 Long term (current) use of anticoagulants: Secondary | ICD-10-CM

## 2023-11-23 DIAGNOSIS — Z953 Presence of xenogenic heart valve: Secondary | ICD-10-CM

## 2023-11-23 DIAGNOSIS — I493 Ventricular premature depolarization: Secondary | ICD-10-CM

## 2023-11-23 LAB — PROTIME INR (PT): INR: 2.8

## 2023-11-24 ENCOUNTER — Encounter: Admit: 2023-11-24 | Discharge: 2023-11-24 | Payer: MEDICARE

## 2023-12-01 ENCOUNTER — Ambulatory Visit: Admit: 2023-12-01 | Discharge: 2023-12-02 | Payer: MEDICARE

## 2023-12-09 ENCOUNTER — Encounter: Admit: 2023-12-09 | Discharge: 2023-12-09 | Payer: MEDICARE

## 2023-12-09 DIAGNOSIS — Z953 Presence of xenogenic heart valve: Secondary | ICD-10-CM

## 2023-12-09 DIAGNOSIS — Z7901 Long term (current) use of anticoagulants: Secondary | ICD-10-CM

## 2023-12-09 DIAGNOSIS — I495 Sick sinus syndrome: Secondary | ICD-10-CM

## 2023-12-09 LAB — PROTIME INR (PT): INR: 2

## 2023-12-17 ENCOUNTER — Encounter: Admit: 2023-12-17 | Discharge: 2023-12-17 | Payer: MEDICARE

## 2023-12-17 MED ORDER — ENOXAPARIN 120 MG/0.8 ML SC SYRG
1 mg/kg | Freq: Two times a day (BID) | SUBCUTANEOUS | 1 refills | 7.00000 days | Status: AC
Start: 2023-12-17 — End: ?

## 2023-12-17 NOTE — Telephone Encounter
Huntley Dec from [facility] called for recommendations for warfarin management for hip surgery.   Discussed with Dr. Avie Arenas and she would like patient to bridge with lovenox.   Recommendations given to Huntley Dec and discussed with patient.

## 2023-12-24 ENCOUNTER — Encounter: Admit: 2023-12-24 | Discharge: 2023-12-24 | Payer: MEDICARE

## 2023-12-28 LAB — PROTIME INR (PT): INR: 3.3

## 2023-12-29 ENCOUNTER — Encounter: Admit: 2023-12-29 | Discharge: 2023-12-29 | Payer: MEDICARE

## 2023-12-29 DIAGNOSIS — Z953 Presence of xenogenic heart valve: Secondary | ICD-10-CM

## 2023-12-29 DIAGNOSIS — I495 Sick sinus syndrome: Secondary | ICD-10-CM

## 2023-12-29 DIAGNOSIS — Z7901 Long term (current) use of anticoagulants: Secondary | ICD-10-CM

## 2023-12-31 ENCOUNTER — Encounter: Admit: 2023-12-31 | Discharge: 2023-12-31 | Payer: MEDICARE

## 2023-12-31 DIAGNOSIS — I5042 Chronic combined systolic (congestive) and diastolic (congestive) heart failure: Secondary | ICD-10-CM

## 2023-12-31 DIAGNOSIS — I4819 Other persistent atrial fibrillation: Secondary | ICD-10-CM

## 2023-12-31 DIAGNOSIS — R600 Localized edema: Secondary | ICD-10-CM

## 2023-12-31 MED ORDER — BUMETANIDE 2 MG PO TAB
2 mg | ORAL_TABLET | Freq: Two times a day (BID) | ORAL | 3 refills | Status: AC
Start: 2023-12-31 — End: ?

## 2024-01-06 ENCOUNTER — Encounter: Admit: 2024-01-06 | Discharge: 2024-01-06 | Payer: MEDICARE

## 2024-01-06 DIAGNOSIS — Z953 Presence of xenogenic heart valve: Secondary | ICD-10-CM

## 2024-01-06 DIAGNOSIS — Z7901 Long term (current) use of anticoagulants: Secondary | ICD-10-CM

## 2024-01-06 DIAGNOSIS — I495 Sick sinus syndrome: Secondary | ICD-10-CM

## 2024-01-06 LAB — PROTIME INR (PT): INR: 2.5

## 2024-01-14 ENCOUNTER — Encounter: Admit: 2024-01-14 | Discharge: 2024-01-14 | Payer: MEDICARE

## 2024-01-14 DIAGNOSIS — Z7901 Long term (current) use of anticoagulants: Secondary | ICD-10-CM

## 2024-01-14 DIAGNOSIS — I495 Sick sinus syndrome: Secondary | ICD-10-CM

## 2024-01-14 DIAGNOSIS — Z953 Presence of xenogenic heart valve: Secondary | ICD-10-CM

## 2024-01-14 LAB — PROTIME INR (PT): INR: 1.8 — ABNORMAL LOW

## 2024-01-19 ENCOUNTER — Encounter: Admit: 2024-01-19 | Discharge: 2024-01-20 | Payer: MEDICARE

## 2024-01-19 ENCOUNTER — Encounter: Admit: 2024-01-19 | Discharge: 2024-01-19 | Payer: MEDICARE

## 2024-01-21 ENCOUNTER — Encounter: Admit: 2024-01-21 | Discharge: 2024-01-21 | Payer: MEDICARE

## 2024-01-21 NOTE — Progress Notes
 Received fax for information about the pt's pacemaker for possible MRI.  Device information faxed to Urology department at [phone].  Received confirmation that the fax was sent successfully

## 2024-01-26 ENCOUNTER — Encounter: Admit: 2024-01-26 | Discharge: 2024-01-26 | Payer: MEDICARE

## 2024-01-27 ENCOUNTER — Encounter: Admit: 2024-01-27 | Discharge: 2024-01-27 | Payer: MEDICARE

## 2024-01-27 DIAGNOSIS — Z953 Presence of xenogenic heart valve: Secondary | ICD-10-CM

## 2024-01-27 DIAGNOSIS — Z7901 Long term (current) use of anticoagulants: Secondary | ICD-10-CM

## 2024-01-27 DIAGNOSIS — I495 Sick sinus syndrome: Secondary | ICD-10-CM

## 2024-01-28 ENCOUNTER — Encounter: Admit: 2024-01-28 | Discharge: 2024-01-28 | Payer: MEDICARE

## 2024-01-28 DIAGNOSIS — I493 Ventricular premature depolarization: Secondary | ICD-10-CM

## 2024-01-28 DIAGNOSIS — I495 Sick sinus syndrome: Secondary | ICD-10-CM

## 2024-01-28 DIAGNOSIS — Z950 Presence of cardiac pacemaker: Secondary | ICD-10-CM

## 2024-01-28 DIAGNOSIS — I480 Paroxysmal atrial fibrillation: Secondary | ICD-10-CM

## 2024-01-28 DIAGNOSIS — I444 Left anterior fascicular block: Secondary | ICD-10-CM

## 2024-01-29 ENCOUNTER — Encounter: Admit: 2024-01-29 | Discharge: 2024-01-29 | Payer: MEDICARE

## 2024-02-02 ENCOUNTER — Encounter: Admit: 2024-02-02 | Discharge: 2024-02-02 | Payer: MEDICARE

## 2024-02-03 ENCOUNTER — Encounter: Admit: 2024-02-03 | Discharge: 2024-02-03 | Payer: MEDICARE

## 2024-02-04 ENCOUNTER — Encounter: Admit: 2024-02-04 | Discharge: 2024-02-04 | Payer: MEDICARE

## 2024-02-15 ENCOUNTER — Encounter: Admit: 2024-02-15 | Discharge: 2024-02-15 | Payer: MEDICARE

## 2024-02-16 ENCOUNTER — Encounter: Admit: 2024-02-16 | Discharge: 2024-02-16 | Payer: MEDICARE

## 2024-02-17 ENCOUNTER — Encounter: Admit: 2024-02-17 | Discharge: 2024-02-17 | Payer: MEDICARE

## 2024-02-17 DIAGNOSIS — I495 Sick sinus syndrome: Secondary | ICD-10-CM

## 2024-02-17 DIAGNOSIS — Z7901 Long term (current) use of anticoagulants: Secondary | ICD-10-CM

## 2024-02-17 DIAGNOSIS — Z953 Presence of xenogenic heart valve: Secondary | ICD-10-CM

## 2024-02-17 LAB — PROTIME INR (PT): INR: 1.3 — ABNORMAL LOW

## 2024-02-20 ENCOUNTER — Encounter: Admit: 2024-02-20 | Discharge: 2024-02-20 | Payer: MEDICARE

## 2024-02-21 LAB — PROTIME INR (PT): INR: 1.7

## 2024-02-22 ENCOUNTER — Encounter: Admit: 2024-02-22 | Discharge: 2024-02-22 | Payer: MEDICARE

## 2024-02-22 DIAGNOSIS — Z953 Presence of xenogenic heart valve: Secondary | ICD-10-CM

## 2024-02-22 DIAGNOSIS — I495 Sick sinus syndrome: Secondary | ICD-10-CM

## 2024-02-22 DIAGNOSIS — Z7901 Long term (current) use of anticoagulants: Secondary | ICD-10-CM

## 2024-02-22 NOTE — Progress Notes
 02/22/2024 10:22 AM     Pt has resumed Warfarin but is going to be holding again starting 02/26/24 for upcoming procedure on 03/02/23.    Will have pt take 3 mg tonight, Tuesday and Wednesday, and recheck on Thursday 02/25/24 to be sure he is therapeutic.     Mychart message sent to the patient.

## 2024-02-23 LAB — PROTIME INR (PT): INR: 2.3

## 2024-02-24 ENCOUNTER — Encounter: Admit: 2024-02-24 | Discharge: 2024-02-24 | Payer: MEDICARE

## 2024-02-24 DIAGNOSIS — Z953 Presence of xenogenic heart valve: Secondary | ICD-10-CM

## 2024-02-24 DIAGNOSIS — I495 Sick sinus syndrome: Secondary | ICD-10-CM

## 2024-02-24 DIAGNOSIS — Z7901 Long term (current) use of anticoagulants: Secondary | ICD-10-CM

## 2024-02-29 ENCOUNTER — Encounter: Admit: 2024-02-29 | Discharge: 2024-02-29 | Payer: MEDICARE

## 2024-02-29 DIAGNOSIS — Z7901 Long term (current) use of anticoagulants: Secondary | ICD-10-CM

## 2024-02-29 DIAGNOSIS — I495 Sick sinus syndrome: Secondary | ICD-10-CM

## 2024-02-29 DIAGNOSIS — Z953 Presence of xenogenic heart valve: Secondary | ICD-10-CM

## 2024-02-29 LAB — PROTIME INR (PT)
INR: 1.5
INR: 1.5 — ABNORMAL LOW (ref 2.0–3.0)

## 2024-03-01 ENCOUNTER — Encounter: Admit: 2024-03-01 | Discharge: 2024-03-01 | Payer: MEDICARE

## 2024-03-01 DIAGNOSIS — Z7901 Long term (current) use of anticoagulants: Secondary | ICD-10-CM

## 2024-03-01 DIAGNOSIS — I495 Sick sinus syndrome: Secondary | ICD-10-CM

## 2024-03-01 DIAGNOSIS — Z953 Presence of xenogenic heart valve: Secondary | ICD-10-CM

## 2024-03-01 LAB — PROTIME INR (PT): INR: 1.2

## 2024-03-05 ENCOUNTER — Encounter: Admit: 2024-03-05 | Discharge: 2024-03-05 | Payer: MEDICARE

## 2024-03-06 LAB — PROTIME INR (PT): INR: 1.3 — ABNORMAL LOW

## 2024-03-07 ENCOUNTER — Encounter: Admit: 2024-03-07 | Discharge: 2024-03-07 | Payer: MEDICARE

## 2024-03-07 DIAGNOSIS — Z7901 Long term (current) use of anticoagulants: Secondary | ICD-10-CM

## 2024-03-07 DIAGNOSIS — Z953 Presence of xenogenic heart valve: Secondary | ICD-10-CM

## 2024-03-07 DIAGNOSIS — I495 Sick sinus syndrome: Secondary | ICD-10-CM

## 2024-03-07 NOTE — Telephone Encounter
 Patient was last seen in December.  He has had no change in symptoms.  He reported to episodes in the month period where he used nitro.  Discussed with Dr. Kevan Peers.  She stated no change in cardiac risk assessment since last office visit.  No office visit or testing is required prior to procedure.    Patient will need bridged with lovenox  before and after procedure.     Patient's Revised Cardiac Risk Index  is moderate for a low oh risk surgery.   Estimated risk of adverse outcomes (perioperative/postoperative events including myocardial infarction, pulmonary edema, ventricular fibrillation, cardiac arrest or complete heart block) with noncardiac surgery is  ~7% from a cardiac standpoint. This patient does not have any contraindication to undergo general anesthesia and the planned surgical procedure.  The full risks and benefits will have to be discussed with the Anesthesiologist  and the Surgeon that will perform the procedure.      Dr. Edger Goody M.D.

## 2024-03-13 ENCOUNTER — Encounter: Admit: 2024-03-13 | Discharge: 2024-03-13 | Payer: MEDICARE

## 2024-03-13 LAB — PROTIME INR (PT): INR: 2.3

## 2024-03-14 ENCOUNTER — Encounter: Admit: 2024-03-14 | Discharge: 2024-03-14 | Payer: MEDICARE

## 2024-03-14 DIAGNOSIS — I495 Sick sinus syndrome: Secondary | ICD-10-CM

## 2024-03-14 DIAGNOSIS — Z7901 Long term (current) use of anticoagulants: Secondary | ICD-10-CM

## 2024-03-14 DIAGNOSIS — Z953 Presence of xenogenic heart valve: Secondary | ICD-10-CM

## 2024-03-19 ENCOUNTER — Encounter: Admit: 2024-03-19 | Discharge: 2024-03-19 | Payer: MEDICARE

## 2024-03-20 ENCOUNTER — Encounter: Admit: 2024-03-20 | Discharge: 2024-03-20 | Payer: MEDICARE

## 2024-03-26 ENCOUNTER — Encounter: Admit: 2024-03-26 | Discharge: 2024-03-26 | Payer: MEDICARE

## 2024-03-28 ENCOUNTER — Encounter: Admit: 2024-03-28 | Discharge: 2024-03-28 | Payer: MEDICARE

## 2024-03-30 ENCOUNTER — Encounter: Admit: 2024-03-30 | Discharge: 2024-03-30 | Payer: MEDICARE

## 2024-03-31 ENCOUNTER — Encounter: Admit: 2024-03-31 | Discharge: 2024-03-31 | Payer: MEDICARE

## 2024-04-01 ENCOUNTER — Encounter: Admit: 2024-04-01 | Discharge: 2024-04-01 | Payer: MEDICARE

## 2024-04-01 NOTE — Progress Notes
 [name], 12/11/1945 has an appointment with [name] on 04/06/24.    Please send recent lab results for continuity of care.    Thank you,   [name]    Phone: [phone]  Fax: [phone]

## 2024-04-04 ENCOUNTER — Encounter: Admit: 2024-04-04 | Discharge: 2024-04-04 | Payer: MEDICARE

## 2024-04-05 ENCOUNTER — Encounter: Admit: 2024-04-05 | Discharge: 2024-04-05 | Payer: MEDICARE

## 2024-04-05 NOTE — Telephone Encounter
 Patient is currently inpatient at [facility] as swing bed.  He recently had hip surgery at [facility] where there were issues with anemia and low bp.  Coumadin and bp medications were on hold.  They are requesting follow up.  [place]uled follow [place] in [place].  They will provide transport requested records be faxed.

## 2024-04-06 ENCOUNTER — Encounter: Admit: 2024-04-06 | Discharge: 2024-04-06 | Payer: MEDICARE

## 2024-04-06 NOTE — Progress Notes
 Received notification that  [employer] has not been connected since 03/21/24. Patient was instructed to look at his/her transmitter to make sure that it is plugged into power and send a manual transmission to reconnect the transmitter. If he/she has any questions about how to send a transmission or if the transmitter does not appear to be working properly, they need to contact the device company directly. Patient was provided with that contact number. Requested the patient send us  a MyChart message or contact our device nurses at [phone] to let us  know after they have sent their transmission. Mychart sent to pt. BC      Note: Patient needs to send a manual remote interrogation to reestablish communication to his/her remote transmitter.

## 2024-04-07 ENCOUNTER — Encounter: Admit: 2024-04-07 | Discharge: 2024-04-07 | Payer: MEDICARE

## 2024-04-07 DIAGNOSIS — I2510 Atherosclerotic heart disease of native coronary artery without angina pectoris: Secondary | ICD-10-CM

## 2024-04-07 DIAGNOSIS — I952 Hypotension due to drugs: Secondary | ICD-10-CM

## 2024-04-07 DIAGNOSIS — I444 Left anterior fascicular block: Secondary | ICD-10-CM

## 2024-04-07 DIAGNOSIS — I495 Sick sinus syndrome: Secondary | ICD-10-CM

## 2024-04-07 DIAGNOSIS — R600 Localized edema: Secondary | ICD-10-CM

## 2024-04-07 DIAGNOSIS — T887XXA Unspecified adverse effect of drug or medicament, initial encounter: Secondary | ICD-10-CM

## 2024-04-07 DIAGNOSIS — I480 Paroxysmal atrial fibrillation: Secondary | ICD-10-CM

## 2024-04-07 DIAGNOSIS — I4819 Other persistent atrial fibrillation: Secondary | ICD-10-CM

## 2024-04-07 DIAGNOSIS — I350 Nonrheumatic aortic (valve) stenosis: Secondary | ICD-10-CM

## 2024-04-07 DIAGNOSIS — Z950 Presence of cardiac pacemaker: Secondary | ICD-10-CM

## 2024-04-07 DIAGNOSIS — I5043 Acute on chronic combined systolic (congestive) and diastolic (congestive) heart failure: Secondary | ICD-10-CM

## 2024-04-07 DIAGNOSIS — G4733 Obstructive sleep apnea (adult) (pediatric): Secondary | ICD-10-CM

## 2024-04-07 DIAGNOSIS — I493 Ventricular premature depolarization: Secondary | ICD-10-CM

## 2024-04-07 DIAGNOSIS — Z136 Encounter for screening for cardiovascular disorders: Secondary | ICD-10-CM

## 2024-04-07 DIAGNOSIS — I5042 Chronic combined systolic (congestive) and diastolic (congestive) heart failure: Secondary | ICD-10-CM

## 2024-04-07 DIAGNOSIS — E782 Mixed hyperlipidemia: Secondary | ICD-10-CM

## 2024-04-07 DIAGNOSIS — Z953 Presence of xenogenic heart valve: Secondary | ICD-10-CM

## 2024-04-07 DIAGNOSIS — Z7901 Long term (current) use of anticoagulants: Secondary | ICD-10-CM

## 2024-04-07 MED ORDER — POTASSIUM CHLORIDE 20 MEQ PO TBTQ
40 meq | ORAL_TABLET | Freq: Every day | ORAL | 3 refills | 30.00000 days | Status: AC
Start: 2024-04-07 — End: ?

## 2024-04-07 MED ORDER — BUMETANIDE 2 MG PO TAB
2 mg | ORAL_TABLET | Freq: Two times a day (BID) | ORAL | 3 refills | 30.00000 days | Status: AC
Start: 2024-04-07 — End: ?

## 2024-04-07 MED ORDER — MULTAQ 400 MG PO TAB
400 mg | ORAL_TABLET | Freq: Two times a day (BID) | ORAL | 3 refills | Status: AC
Start: 2024-04-07 — End: ?

## 2024-04-07 NOTE — Progress Notes
 Date of Service: 04/07/2024    Esai Stecklein is a 78 y.o. male.       HPI      Brinley Treanor is a 78 y.o. 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    Patient was admitted at [facility] campus between 03/22/2024 - 03/29/2024, he underwent right hip replacement.  Postoperatively he did have a slow progress due to blood loss, decreased hemoglobin (8.5 g/dL), he did require blood transfusion and also iron infusion.    This hospitalization he was seen in cardiology consult, changes on his medications have been made and this consisted of:  Warfarin no discontinued prior to surgery with the recommendation to resume it when cleared by the orthopedic surgery at the outpatient follow-up office visit  Dronedarone was discontinued due to concerns of heart failure (patient's LVEF is normal),  The diltiazem and metoprolol were held during 2 hypotension, he was also advised to restart the bumetanide.    At present time the medication list indicates that he has not taken warfarin, dronedarone, he is on diltiazem for AV node blockade, he is not on any diuretics.  He continues physical therapy at a local rehab center.         Vitals:    04/07/24 1004   BP: 122/72   BP Source: Arm, Left Upper   Pulse: 65   SpO2: 100%   O2 Device: None (Room air)   PainSc: Zero   Weight: 132 kg (291 lb)  Comment: pt stated weight   Height: 182.9 cm (6')     Body mass index is 39.47 kg/m?Aaron Aas     Past Medical History  Patient Active Problem List    Diagnosis Date Noted    S/p TAVR (transcatheter aortic valve replacement), bioprosthetic 08/20/2023     08/20/23: Dr. Madelene Schanz and Dr. Harlee Lichtenstein, # 29 Sapien S3      Hypokalemia 08/20/2023    Hypocalcemia 08/20/2023    Nonrheumatic aortic valve stenosis 07/28/2023    Renal mass 07/28/2023    Chronic combined systolic and diastolic congestive heart failure, NYHA class 2 (CMS-HCC) 04/30/2023    Medication side effects 08/11/2022     While hospitalized in September 2023 patient was initiated on amiodarone for rate control in the setting of atrial fibrillation.  He developed an extensive, generalized micropapular rash thought to be due to amiodarone.      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 Daryll Epp: 30% instent restenosis of previously placed stent in the LAD, and patent 2nd DIAG stent and 60% distal LAD disease. Medical management  11/20/2016 - Cardiac Catheterization:  [facility])  Moderately depressed LV function, ejection fraction 30% to 40% with global hypokinesis and no mitral regurgitation, well compensated (EDP equals 10).  Noncritical coronary disease as noted; left main 0% stenosis, LAD proximal  0%, mid 30%, circumflex 0%, RCA (dominant) 0%.   10/19/2020 - Cardiac Catheterization:  ([facility])  Mid LAD lesion is 55% stenosed. IFR does not indicate any hemodynamically significant disease.  1st Diag lesion is 70% stenosed. Small to medium size.  2nd Diag lesion is 50% stenosed. Small-vessel.  There is mild left ventricular systolic dysfunction. The ejection fraction is 45-50% with apical wall hypokinesis.  04/17/2022 - Regadenoson MPI:   This study is normal with no evidence of significant myocardial ischemia. Left ventricular systolic function is low normal. There are no high risk prognostic indicators present.  The pharmacologic ECG portion of the study is negative for ischemia.  Comparison is made with a prior ADAC study completed 07/09/2018.  Ejection fraction was 46%.  Compared to the previous study left ventricular ejection fraction is increased, no change in perfusion findings.  In aggregate the current study is low risk in regards to predicted annual cardiovascular mortality rate.  01/09/2023 - Cardiac Catheterization:  ([facility])  Unsuccessful PCI of the subdivision of the ramus intermedius secondary to difficulty with access and inability to adequately cannulate the left main trunk   01/15/2023 - Cardiac Catheterization:  ([facility])  Successful PTCA/DES of ramus intermedius with reduction in stenosis from 90% to 0%.       Peripheral neuropathy 11/12/2009         Review of Systems   Constitutional: Negative.   HENT: Negative.     Eyes: Negative.    Cardiovascular: Negative.    Respiratory: Negative.     Endocrine: Negative.    Hematologic/Lymphatic: Negative.    Skin: Negative.    Musculoskeletal: Negative.    Gastrointestinal: Negative.    Genitourinary: Negative.    Neurological: Negative.    Psychiatric/Behavioral: Negative.     Allergic/Immunologic: Negative.        Physical Exam  General Appearance: normal in appearance  Skin: warm, moist, no ulcers or xanthomas  Eyes: conjunctivae and lids normal, pupils are equal and round  Lips & Oral Mucosa: no pallor or cyanosis  Neck Veins: neck veins are flat, neck veins are not distended  Chest Inspection: chest is normal in appearance  Respiratory Effort: breathing comfortably, no respiratory distress  Auscultation/Percussion: lungs clear to auscultation, no rales or rhonchi, no wheezing  Cardiac Rhythm: regular rhythm and normal rate  Cardiac Auscultation: S1, S2 normal, no rub, no gallop  Murmurs: no murmur  Carotid Arteries: normal carotid upstroke bilaterally, no bruit  Lower Extremity Edema: 3+ lower extremity edema  Abdominal Exam: soft, non-tender, no masses, bowel sounds normal  Liver & Spleen: no organomegaly  Language and  Memory: patient responsive and seems to comprehend information  Neurologic Exam: neurological assessment grossly intact        Cardiovascular Studies  Twelve-lead EKG demonstrates a paced V sensed rhythm, right bundle branch block, left axis deviation.    Cardiovascular Health Factors  Vitals BP Readings from Last 3 Encounters:   04/07/24 122/72   10/29/23 102/70   10/09/23 104/59     Wt Readings from Last 3 Encounters:   04/07/24 132 kg (291 lb)   10/29/23 125.9 kg (277 lb 9.6 oz)   10/09/23 124.7 kg (275 lb)     BMI Readings from Last 3 Encounters:   04/07/24 39.47 kg/m?   10/29/23 37.65 kg/m?   10/09/23 37.30 kg/m?      Smoking Social History     Tobacco Use   Smoking Status Never   Smokeless Tobacco Never      Lipid Profile Cholesterol   Date Value Ref Range Status   07/29/2022 117 <200 MG/DL Final     HDL   Date Value Ref Range Status   07/29/2022 43 >40 MG/DL Final     LDL   Date Value Ref Range Status   07/29/2022 61 <100 mg/dL Final     Triglycerides   Date Value Ref Range Status   07/29/2022 74 <150 MG/DL Final      Blood Sugar Hemoglobin A1C   Date Value Ref Range Status   08/17/2023 5.5 4.0 - 5.7 % Final     Comment:     The ADA recommends that most patients with type 1 and type 2 diabetes maintain   an A1c level <7%.       Glucose   Date Value Ref Range Status   10/21/2023 99  Final   08/21/2023 118 (H) 70 - 100 MG/DL Final   16/08/9603 540 (H) 70 - 100 MG/DL Final          Problems Addressed Today  Encounter Diagnoses   Name Primary?    SSS (sick sinus syndrome) (CMS-HCC) Yes    S/p TAVR (transcatheter aortic valve replacement), bioprosthetic     PVC's (premature ventricular contractions)     Paroxysmal atrial fibrillation (CMS-HCC)     Nonrheumatic aortic valve stenosis     LAFB (left anterior fascicular block)     Hypotension due to drugs     Mixed hyperlipidemia     Chronic combined systolic and diastolic congestive heart failure, NYHA class 2 (CMS-HCC)     Coronary artery disease involving native coronary artery of native heart without angina pectoris     OSA (obstructive sleep apnea)     Medication side effects     Chronic anticoagulation, on warfarin     Cardiac pacemaker in situ     Screening for heart disease        Assessment and Plan     Assessment:    1.  Acute on chronic HFpEF with significant bilateral lower extremity edema  Currently not on any diuretic  2.  Status post right hip surgery on 03/22/2024-recovering well  3.  History of significant anemia, hemoglobin = 6.5 g/dL during his last admission  Patient did receive blood transfusion and iron infusion  4.  Coronary artery disease  PTCA of LAD in April 2010  PTCA of diagonal branch in February 2013  Status post LHC in 10/2020 in [place] -at that time findings were consistent with mid LAD lesion 55%, IFR did not indicate any hemodynamically significant stenosis, D1 lesion  70% stenosis, this is known from before and this is a small to medium size vessel, D2 50% stenosis is a small vessel  5.  History of non-ST MI in February 2024  PTCA of LAD in April 2010  PTCA of diagonal branch in February 2013  Status post LHC in 10/2020 in [place] -at that time findings were consistent with mid LAD lesion 55%, IFR did not indicate any hemodynamically significant stenosis, D1 lesion 70% stenosis, this is known from before and this is a small to medium size vessel, D2 50% stenosis is a small vessel  6.  History of severe aortic valve stenosis, status post TAVR on 08/20/2023  7.Persistent atrial fibrillation  Previously on sotalol  Intolerance to amiodarone due to allergic reaction while an inpatient at [facility] in September 2023  Dronedarone was discontinued while admitted at the outside hospital  8.  History of atrial flutter-this was previously detected on EKG and PPM checks, no recurrence  9.  History of sick sinus syndrome, status post PPM implant in July 2011  10.  History of PVCs and status post RFA in 2018-no significant recurrence  11.  OSA-uses CPAP machine  12.  Incidental diagnosis of renal mass-patient did undergo evaluation in the urology department at [facility], it was decided to pursue surveillance and conservative treatment    Plan:    1.  Resume bumetanide 2 mg p.o. daily along with potassium replacement  2.  Resume dronedarone 400 mg p.o. twice daily for prevention of atrial arrhythmias  3.  Continue diltiazem at the present dose, do not take any beta-blocker for now  4.  Patient has follow-up office visit with the orthopedic department, he would be instructed when it is safe to resume the vitamin K antagonist  5.  Further evaluation with a 2D echo Doppler study and follow-up office visit in approximately 3 months.    Total Time Today was 40 minutes in the following activities: Preparing to see the patient, Obtaining and/or reviewing separately obtained history, Performing a medically appropriate examination and/or evaluation, Counseling and educating the patient/family/caregiver, Ordering medications, tests, or procedures, Referring and communication with other health care professionals (when not separately reported), Documenting clinical information in the electronic or other health record, Independently interpreting results (not separately reported) and communicating results to the patient/family/caregiver, and Care coordination (not separately reported)          Current Medications (including today's revisions)   Amino Acids-Protein Hydrolys (PROTEINEX) 15-60 gram-kcal/30 mL liqd Take  by mouth.    atorvastatin (LIPITOR) 80 mg tablet Take one tablet by mouth daily. (Patient taking differently: Take one tablet by mouth at bedtime daily.)    Cyanocobalamin 2,000 mcg TbER Take  by mouth.    dilTIAZem CD (CARDIZEM CD) 180 mg capsule Take one capsule by mouth twice daily. Indications: paroxysmal supraventricular tachycardia    ferrous sulfate (FEOSOL) 325 mg (65 mg iron) tablet Take one tablet by mouth daily. Take on an empty stomach at least 1 hour before or 2 hours after food.    finasteride (PROSCAR) 5 mg tablet Take one tablet by mouth at bedtime daily.    folic acid (FOLVITE) 1 mg tablet Take one tablet by mouth daily.    gabapentin (NEURONTIN) 300 mg capsule Take three capsules by mouth three times daily.    L.acidoph/B.animalis/B.longum (FLORAJEN3 PO) Take 390 mg by mouth daily.    MELATONIN PO Take 12 mg by mouth at bedtime daily. (Patient taking differently: Take 10 mg by  mouth at bedtime daily.)    pantoprazole DR (PROTONIX) 40 mg tablet Take one tablet by mouth daily.    vibegron (GEMTESA PO) Take 75 mg by mouth daily.    vit A/vit C/vit E/zinc/copper (PRESERVISION AREDS PO) Take 1 tablet by mouth twice daily.

## 2024-04-07 NOTE — Patient Instructions
 Thank you for visiting our office today.    We would like to make the following medication adjustments:      Patient needs to resume Bumex 2 mg twice daily,  multaq 400 mg twice daily and potassium 20 meq 2 tablets daily      Otherwise continue the same medications as you have been doing.          We will be pursuing the following tests after your appointment today:    Echocardiogram    We will plan to see you back in 3 months.  Please call us  in the meantime with any questions or concerns.        Please allow 5-7 business days for our providers to review your results. All normal results will go to MyChart. If you do not have Mychart, it is strongly recommended to get this so you can easily view all your results. If you do not have mychart, we will attempt to call you once with normal lab and testing results. If we cannot reach you by phone with normal results, we will send you a letter.  If you have not heard the results of your testing after one week please give us  a call.       Your Cardiovascular Medicine [place] Team Siegfried Dress, Towanda Fret and Nambe)  phone number is [phone].

## 2024-04-12 ENCOUNTER — Encounter: Admit: 2024-04-12 | Discharge: 2024-04-12 | Payer: MEDICARE

## 2024-04-13 ENCOUNTER — Encounter: Admit: 2024-04-13 | Discharge: 2024-04-13 | Payer: MEDICARE

## 2024-04-13 NOTE — Progress Notes
 Patient requested to have testing completed at outside facility. Echocardiogram orders faxed to [facility] Scheduling at [phone].

## 2024-04-15 ENCOUNTER — Encounter: Admit: 2024-04-15 | Discharge: 2024-04-15 | Payer: MEDICARE

## 2024-04-21 ENCOUNTER — Encounter: Admit: 2024-04-21 | Discharge: 2024-04-21 | Payer: MEDICARE

## 2024-04-21 ENCOUNTER — Ambulatory Visit: Admit: 2024-04-21 | Discharge: 2024-04-21 | Payer: MEDICARE

## 2024-04-22 ENCOUNTER — Encounter: Admit: 2024-04-22 | Discharge: 2024-04-22 | Payer: MEDICARE

## 2024-04-25 ENCOUNTER — Encounter: Admit: 2024-04-25 | Discharge: 2024-04-25 | Payer: MEDICARE

## 2024-04-26 ENCOUNTER — Encounter: Admit: 2024-04-26 | Discharge: 2024-04-26 | Payer: MEDICARE

## 2024-04-27 ENCOUNTER — Encounter: Admit: 2024-04-27 | Discharge: 2024-04-27 | Payer: MEDICARE

## 2024-04-27 NOTE — Telephone Encounter
 Discussed with patient.  He was recently admitted at [facility]. Jolie Neat in [place].  He states that he was informed that he was in afib while in the hospital and was evaluated by cardiology there.  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  Siegfried Dress is tolerating well.  He starts chemotherapy on Friday.  He denies any issues or symptoms.      Will route to Dr. Quinten Bucker for review and recommendations.

## 2024-04-27 NOTE — Telephone Encounter
-----   Message from John Sevier W sent at 04/26/2024  4:20 PM CDT -----  Regarding: FW: [facility] several sends by pt and AF alert for 6 hours total- recent surgery on ASA.    ----- Message -----  From: Cleave Curling, RN  Sent: 04/26/2024  11:39 AM CDT  To: Cvm Nurse [place]  Subject: [facility] several sends by pt and AF alert for 6 h#    Hi Team,     Pt follows w/ [facility]. Please follow up w/ pt and [facility] for recs.     Thank you!  ----- Message -----  From: Rosalie Colony, RN  Sent: 04/26/2024   8:46 AM CDT  To: Cvm Nurse Ep Team C  Subject: several sends by pt and AF alert for 6 hours#    several sends by pt and AF alert for 6 hours total- recent surgery on ASA.     See list. V rates were not controlled on 04/22/24 report.   Please review MPE and pt.     Thank you.

## 2024-04-29 ENCOUNTER — Encounter: Admit: 2024-04-29 | Discharge: 2024-04-29 | Payer: MEDICARE

## 2024-05-30 ENCOUNTER — Encounter: Admit: 2024-05-30 | Discharge: 2024-05-30 | Payer: MEDICARE

## 2024-05-31 ENCOUNTER — Encounter: Admit: 2024-05-31 | Discharge: 2024-05-31 | Payer: MEDICARE

## 2024-06-06 ENCOUNTER — Encounter: Admit: 2024-06-06 | Discharge: 2024-06-06 | Payer: MEDICARE

## 2024-06-09 ENCOUNTER — Encounter: Admit: 2024-06-09 | Discharge: 2024-06-09 | Payer: MEDICARE

## 2024-06-10 ENCOUNTER — Encounter: Admit: 2024-06-10 | Discharge: 2024-06-10 | Payer: MEDICARE

## 2024-06-10 NOTE — Telephone Encounter
 [facility].  Called to amiodarone  and prednisone .  Stated prednisone  will decrease diltiazem 's effect.

## 2024-06-13 ENCOUNTER — Encounter: Admit: 2024-06-13 | Discharge: 2024-06-13 | Payer: MEDICARE

## 2024-06-14 ENCOUNTER — Encounter: Admit: 2024-06-14 | Discharge: 2024-06-14 | Payer: MEDICARE

## 2024-06-14 DIAGNOSIS — Z950 Presence of cardiac pacemaker: Principal | ICD-10-CM

## 2024-06-14 NOTE — Telephone Encounter
-----   Message from Kendell Sagraves O sent at 06/14/2024  9:07 AM CDT -----  Regarding: RE: MRI  10/1 works. Outpatient schedule with appt notes  MRI Smartsync, checklist complete  ----- Message -----  From: Arlis Maus  Sent: 06/14/2024   9:04 AM CDT  To: [facility] Device Mri  Subject: RE: MRI                                          10/1 1000 CA  ----- Message -----  From: Yulieth Carrender, BSN  Sent: 06/14/2024   8:48 AM CDT  To: Maus Raley-Gordon; [facility] Mri  Subject: RE: MRI                                          This is Chiropractor. I can do checklist. Date?  ----- Message -----  From: Arlis Maus  Sent: 06/14/2024   7:43 AM CDT  To: [facility] Device Mri  Subject: MRI                                              Name: Robert [facility]     Exam:  MRI PELVIS WOW CONTR     Ordering physician:  Gordy Lean     Expected date or time frame:     Paste device information below:       GENERATOR ATRIAL LEAD RV LEAD LV LEAD  Manufacturer: Medtronic  Medtronic  Medtronic      Model NumberBETHA Nelwyn HEWS DR MRI [id number] CAPSURE MRI 4913-41 CAPSUREFIX MRI   Serial Number: [id number] KANDICE [id number] LULLA [id number]   Implant Date: [id number] 05/21/2010 05/21/2010   Lead Location:              Device Type: IPG

## 2024-06-14 NOTE — Progress Notes
 I have reviewed the  chest x ray. The system is absent of lead extenders, lead adapters, broken leads, permanent epicardial leads, and abandoned leads. I agree with the documented recommendations. Proceed with MRI scheduling.        Seth Bake, APRN-C, CEPS   Heart Rhythm Management   (available on Voalte and AMS pager [phone])  (EP rounding team pager [phone])

## 2024-06-14 NOTE — Progress Notes
 [facility]  MRI Recommendations for [place] Cardiac Device Patient Conditional MRI     Patient: Robert Mercado  Date of Birth: 07-27-1946  MRN: [id number]    MRI SYSTEM CONDITIONS    Patient has a complete MRI ready system with MR Conditional generator and lead(s) Leads implanted > 6 weeks prior to MRI:                         [x]   Yes    []  No      Device FDA approved to undergo MRI Scan at:  [x]  1.5T    [x]  3T     DEVICE IMAGING      Patient has current chest imaging that meets policy criteria:    [x]   Yes.    []  No. Chest imaging needed to proceed.    Generator is implanted in the pectoral region: [x]   Yes / Leadless []  No      DEVICE EVALUATION      [x]   Patient meets eligibility for SmartSync SureScan.  This section does NOT need completed; the application will collect relevant device data at the time of the MRI.    []  Patient has an S-ICD. This section does NOT need completed.     Generator has sufficient battery (generator is not at end-of-life/ ERI/ EOS/ RRT):  []   Yes   []  No      Patient is historically NOT dependent (as defined by no escape greater than 40 bpm):  []   Correct   []  Incorrect   Lead impedance measurements are within the programmed lead impedance limits (For atrial and ventricular pacing leads: lead impedance 200-2,000 ohms.  For defibrillation leads: lead impedance 20-200 ohms): []   Yes   []  No      Patient tolerates a pacing output of 5.0 V at a pulse width of 1.0 ms []   Yes []  Assess at time of MRI   Pacing Percentages:   [x]  Atrial Pacing:  Not Applicable              [x]  Ventricular Pacing:  Not Applicable            Capture threshold < 3.5 V at a pulse width of 0.5 ms for RA and RV leads for devices that will be programmed to an asynchronous pacing mode []   Yes    []  No      Preliminary recommendations for PRE-MRI SCAN DEVICE SETTINGS*  (Full assessment of programming to be confirmed by device staff or representative at time of MRI)   Pacing rate selected to avoid competitive pacing (recommendations based on historical data). Tachy detections will be turned off for MRI via MRI mode programmed setting:    Utilize SmartSync application to place patient in SureScan mode                   Images Reviewed and Recommendations Made in Collaboration with:  Routed to Provider In-Basket for Review (see result note for sign off) Date: 06/14/24     Assessment completed by: Amarionna Arca, BSN    *The [place] Device Team has completed this assessment based on current available data in the patient's medical record.  If changes/ concerns are noted at the time of the MRI, the [facility] Device Staff/ Vendor Representative have the discretion to use best clinical judgement to ensure the safety of the patient.      Medtronic SmartSync application has safety  parameters in place.  If progression to positive SureScan meets lockout criteria, please contact the [place] Device Team or [employer] representative.  Please send report of final programming parameters sent to [place] device team (Email: [email or web address] Fax: [phone])      [place]    [email or web address]-[email or web address]  [employer]     1-800-[email or web address]    [employer]     [phone]  [email or web address]-[email or web address]

## 2024-06-19 ENCOUNTER — Encounter: Admit: 2024-06-19 | Discharge: 2024-06-19 | Payer: MEDICARE

## 2024-06-20 ENCOUNTER — Encounter: Admit: 2024-06-20 | Discharge: 2024-06-20 | Payer: MEDICARE

## 2024-07-01 ENCOUNTER — Encounter: Admit: 2024-07-01 | Discharge: 2024-07-01 | Payer: MEDICARE

## 2024-07-11 ENCOUNTER — Encounter: Admit: 2024-07-11 | Discharge: 2024-07-11 | Payer: MEDICARE

## 2024-07-12 ENCOUNTER — Ambulatory Visit: Admit: 2024-07-12 | Discharge: 2024-07-12 | Payer: MEDICARE

## 2024-07-12 ENCOUNTER — Encounter: Admit: 2024-07-12 | Discharge: 2024-07-12 | Payer: MEDICARE

## 2024-07-12 DIAGNOSIS — T887XXA Unspecified adverse effect of drug or medicament, initial encounter: Secondary | ICD-10-CM

## 2024-07-12 DIAGNOSIS — I5043 Acute on chronic combined systolic (congestive) and diastolic (congestive) heart failure: Secondary | ICD-10-CM

## 2024-07-12 DIAGNOSIS — U071 COVID-19 virus infection: Secondary | ICD-10-CM

## 2024-07-12 DIAGNOSIS — I952 Hypotension due to drugs: Secondary | ICD-10-CM

## 2024-07-12 DIAGNOSIS — Z953 Presence of xenogenic heart valve: Secondary | ICD-10-CM

## 2024-07-12 DIAGNOSIS — I444 Left anterior fascicular block: Secondary | ICD-10-CM

## 2024-07-12 DIAGNOSIS — I350 Nonrheumatic aortic (valve) stenosis: Secondary | ICD-10-CM

## 2024-07-12 DIAGNOSIS — I493 Ventricular premature depolarization: Secondary | ICD-10-CM

## 2024-07-12 DIAGNOSIS — I480 Paroxysmal atrial fibrillation: Secondary | ICD-10-CM

## 2024-07-12 DIAGNOSIS — Z6837 Body mass index (BMI) 37.0-37.9, adult: Secondary | ICD-10-CM

## 2024-07-12 DIAGNOSIS — G4733 Obstructive sleep apnea (adult) (pediatric): Secondary | ICD-10-CM

## 2024-07-12 DIAGNOSIS — R0989 Other specified symptoms and signs involving the circulatory and respiratory systems: Secondary | ICD-10-CM

## 2024-07-12 DIAGNOSIS — E782 Mixed hyperlipidemia: Secondary | ICD-10-CM

## 2024-07-12 DIAGNOSIS — I2510 Atherosclerotic heart disease of native coronary artery without angina pectoris: Secondary | ICD-10-CM

## 2024-07-12 DIAGNOSIS — I495 Sick sinus syndrome: Principal | ICD-10-CM

## 2024-07-12 NOTE — Progress Notes
 Date of Service: 07/12/2024    Robert Mercado is a 78 y.o. male.       HPI      Robert Mercado is a 78 y.o. 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    At present time patient is undergoing chemotherapy for the prostate cancer.  He continues to have symptoms of heart failure with bilateral lower extremity edema.  He has a very poor functional status that is multifactorial.  He also has lost weight, of note patient did weigh 275 pounds in November 2024, he currently weighs 270 pounds.    Patient continues on dronedarone  for atrial fibrillation control, apparently there is an interaction with Relugolix that he takes for the metastatic prostate cancer.  Patient has managed to change the schedule of his medication and is able to manage this issue.    The last PPM check dated 04/26/2024 revealed episodes of atrial fibrillation, atrial tachycardia/AF burden 68.1% with ventricular rates less more than 100 bpm 80% of the time while he is in this arrhythmia.         Vitals:    07/12/24 1039   BP: 136/84   BP Source: Arm, Left Upper   Pulse: 63   SpO2: 99%   O2 Device: None (Room air)   PainSc: Seven   Weight: 122.6 kg (270 lb 3.2 oz)   Height: 182.9 cm (6')     Body mass index is 36.65 kg/m?SABRA     Past Medical History  Patient Active Problem List    Diagnosis Date Noted    S/p TAVR (transcatheter aortic valve replacement), bioprosthetic 08/20/2023     08/20/23: Dr. Linford Labrum and Dr. Grayson, # 29 Sapien S3      Hypokalemia 08/20/2023    Hypocalcemia 08/20/2023    Nonrheumatic aortic valve stenosis 07/28/2023    Renal mass 07/28/2023    Acute on chronic combined systolic (congestive) and diastolic (congestive) heart failure (CMS-HCC) 04/30/2023    Medication side effects 08/11/2022     While hospitalized in September 2023 patient was initiated on amiodarone  for rate control in the setting of atrial fibrillation.  He developed an extensive, generalized micropapular rash thought to be due to amiodarone .      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 Lannis: 30% instent restenosis of previously placed stent in the LAD, and patent 2nd DIAG stent and 60% distal LAD disease. Medical management  11/20/2016 - Cardiac Catheterization:  [facility])  Moderately depressed LV function, ejection fraction 30% to 40% with global hypokinesis and no mitral regurgitation, well compensated (EDP equals 10).  Noncritical coronary disease as noted; left main 0% stenosis, LAD proximal  0%, mid 30%, circumflex 0%, RCA (dominant) 0%.   10/19/2020 - Cardiac Catheterization:  ([facility])  Mid LAD lesion is 55% stenosed. IFR does not indicate any hemodynamically significant disease.  1st Diag lesion is 70% stenosed. Small to medium size.  2nd Diag lesion is 50% stenosed. Small-vessel.  There is mild left ventricular systolic dysfunction. The ejection fraction is 45-50% with apical wall hypokinesis.  04/17/2022 - Regadenoson  MPI:   This study is normal with no evidence of significant myocardial ischemia. Left ventricular systolic function is low normal. There are no high risk prognostic indicators present.  The pharmacologic ECG portion of the study is negative for ischemia.  Comparison is made with a prior ADAC study completed 07/09/2018.  Ejection fraction was 46%.  Compared to the previous study left ventricular ejection fraction is increased, no change in perfusion findings.  In aggregate the current study is low risk in regards to predicted annual cardiovascular mortality rate.  01/09/2023 - Cardiac Catheterization:  ([facility])  Unsuccessful PCI of the subdivision of the ramus intermedius secondary to difficulty with access and inability to adequately cannulate the left main trunk   01/15/2023 - Cardiac Catheterization:  ([facility])  Successful PTCA/DES of ramus intermedius with reduction in stenosis from 90% to 0%.       Peripheral neuropathy 11/12/2009         Review of Systems   Constitutional: Positive for malaise/fatigue.   HENT: Negative.     Eyes: Negative.    Cardiovascular:  Positive for chest pain, dyspnea on exertion and leg swelling.   Respiratory:  Positive for shortness of breath.    Endocrine: Negative.    Hematologic/Lymphatic: Negative.    Skin: Negative.    Musculoskeletal:  Positive for joint pain and muscle weakness.   Gastrointestinal: Negative.    Genitourinary: Negative.    Neurological:  Positive for weakness.   Psychiatric/Behavioral: Negative. Allergic/Immunologic: Negative.        Physical Exam  General Appearance: Pale, chronically ill, BMI 36.65 kg/m?  Skin: warm, moist, no ulcers or xanthomas  Eyes: conjunctivae and lids normal, pupils are equal and round  Lips & Oral Mucosa: no pallor or cyanosis  Neck Veins: neck veins are flat, neck veins are not distended  Chest Inspection: chest is normal in appearance  Respiratory Effort: breathing comfortably, no respiratory distress  Auscultation/Percussion: lungs clear to auscultation, no rales or rhonchi, no wheezing  Cardiac Rhythm: regular rhythm and normal rate  Cardiac Auscultation: S1, S2 normal, no rub, no gallop  Murmurs: no murmur  Carotid Arteries: normal carotid upstroke bilaterally, no bruit  Lower Extremity Edema: 2+ bilateral lower extremity edema, ankle edema and feet edema  Abdominal Exam: soft, non-tender, no masses, bowel sounds normal  Liver & Spleen: no organomegaly  Language and Memory: patient responsive and seems to comprehend information  Neurologic Exam: neurological  assessment grossly intact      Cardiovascular Studies    Twelve-lead EKG demonstrates a paced V sensed rhythm, left axis deviation, right bundle branch block, QT/QTc 530/542 ms, ventricular rate 63 bpm.    Cardiovascular Health Factors  Vitals BP Readings from Last 3 Encounters:   07/12/24 136/84   04/21/24 122/72   04/07/24 122/72     Wt Readings from Last 3 Encounters:   07/12/24 122.6 kg (270 lb 3.2 oz)   04/21/24 126 kg (277 lb 12.5 oz)   04/07/24 132 kg (291 lb)     BMI Readings from Last 3 Encounters:   07/12/24 36.65 kg/m?   04/21/24 37.67 kg/m?   04/07/24 39.47 kg/m?      Smoking Tobacco Use History[1]   Lipid Profile Cholesterol   Date Value Ref Range Status   07/29/2022 117 <200 MG/DL Final     HDL   Date Value Ref Range Status   07/29/2022 43 >40 MG/DL Final     LDL   Date Value Ref Range Status   07/29/2022 61 <100 mg/dL Final     Triglycerides   Date Value Ref Range Status   07/29/2022 74 <150 MG/DL Final Blood Sugar Hemoglobin A1C   Date Value Ref Range Status   08/17/2023 5.5 4.0 - 5.7 % Final     Comment:     The ADA recommends that most patients with type 1 and type 2 diabetes maintain   an A1c level <7%.       Glucose   Date Value Ref Range Status   06/08/2024 119 (H) 70 - 115 Final   10/21/2023 99  Final   08/21/2023 118 (H) 70 - 100 MG/DL Final          Problems Addressed Today  Encounter Diagnoses   Name Primary?    SSS (sick sinus syndrome) (CMS-HCC) Yes    S/p TAVR (transcatheter aortic valve replacement), bioprosthetic     PVC's (premature ventricular contractions)     Paroxysmal atrial fibrillation (CMS-HCC)     Nonrheumatic aortic valve stenosis     LAFB (left anterior fascicular block)     Hypotension due to drugs     Mixed hyperlipidemia     Coronary artery disease involving native coronary artery of native heart without angina pectoris     Acute on chronic combined systolic (congestive) and diastolic (congestive) heart failure (CMS-HCC)     OSA (obstructive sleep apnea)     Medication side effects     COVID-19 virus infection     BMI 37.0-37.9, adult     Cardiovascular symptoms        Assessment and Plan     Assessment:    1.  Acute on chronic HF PEF  Last evaluation with a 2D echo Doppler study dated 04/22/2024 demonstrated LVEF 55 to 60%, severe RV enlargement with reduced systolic function, TAVR # 29 mm SAPIEN S3 was assessed to functionable, MG was not reported  2.  Coronary artery disease-no symptoms of angina, no use of sublingual nitroglycerin   PCI of the LAD in April 2010  PTCA of diagonal branch in February 2013  Status post LHC in December 2021 in [facility] [place] , at that time patient was found to have a mid LAD lesion of 55%, IFR did not indicate any significant stenosis, D1 70%, small to medium sized vessel, D2 50%-medical treatment was recommended  3.  History of non-ST MI in February 2024  4.  History of severe aortic  valve stenosis-status post TAVR on 08/20/2023, patient received # 29 mm SAPIEN S3 - assessed   to function fine by the last echocardiogram  5.  History of longstanding persistent atrial fibrillation  Previously on sotalol  Intolerance to amiodarone  due to developing an extensive morbilliform rash while receiving this as an inpatient at [facility] in September 2023-currently on dronedarone   Currently not anticoagulated  6.  Abnormal PPM check-patient continues to have atrial arrhythmia approximately 60% of the time  Not anticoagulated due to anemia  7.  Medication interaction between dronedarone  and Relugolix-patient is taking them 6 hours apart and has managed well  8.  History of atrial flutter-previously detected on EKG and PPM checks  9.  History of sick sinus syndrome, status post PPM implant in July 2011  10.  History of PVCs and status post RFA in 2018  11.  OSA-uses CPAP  12.  Metastatic prostate cancer-metastasis to the sternum and thoracic spine, undergoing treatment  13.  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  Patient will be able to manage schedule dronedarone  and Relugolix, I recommend to maintain the current schedule of approximately 6 hours apart  2.  I also recommended to decrease the sugar and carbohydrate intake and increase the protein intake  3.  Continue regular PPM checks  4.  Check vitamin D3 level  5.  If not otherwise contraindicated from an oncology standpoint/anemia/multiple other comorbidities I do recommend to initiate anticoagulation-patient does have atrial arrhythmia, the PPM check in June 2025 revealed 60% burden.    If the risk of bleeding is acceptable, the anticoagulation choice can be either apixaban or enoxaparin -either 1 is acceptable from a cardiac standpoint.  5.  Follow-up office visit in March - April 2026    Total Time Today was 40 minutes in the following activities: Preparing to see the patient, Obtaining and/or reviewing separately obtained history, Performing a medically appropriate examination and/or evaluation, Counseling and educating the patient/family/caregiver, Ordering medications, tests, or procedures, Referring and communication with other health care professionals (when not separately reported), Documenting clinical information in the electronic or other health record, Independently interpreting results (not separately reported) and communicating results to the patient/family/caregiver, and Care coordination (not separately reported)          Current Medications (including today's revisions)   abiraterone (ZYTIGA) 250 mg tablet Take four tablets by mouth daily.    atorvastatin  (LIPITOR) 80 mg tablet Take one tablet by mouth daily.    bumetanide  (BUMEX ) 2 mg tablet Take one tablet by mouth twice daily. Indications: accumulation of fluid resulting from chronic heart failure (Patient taking differently: Take one tablet by mouth. Pt takes 2mg  every morning and 1mg  in the PM  Indications: accumulation of fluid resulting from chronic heart failure)    Cyanocobalamin 2,000 mcg TbER Take one tablet by mouth daily.    cyclobenzaprine (FLEXERIL) 5 mg tablet Take one tablet by mouth three times daily as needed.    dilTIAZem  CD (CARDIZEM  CD) 180 mg capsule Take one capsule by mouth twice daily. Indications: paroxysmal supraventricular tachycardia    dronedarone  (MULTAQ ) 400 mg tablet Take one tablet by mouth twice daily with meals.    ELIQUIS DVT-PE TREAT 30D START 5 mg (74 tabs) tablets in a dose pack Take one tablet by mouth twice daily.    fluticasone  propionate (FLONASE ) 50 mcg/actuation nasal spray, suspension Apply two sprays to each nostril as directed twice daily.  folic acid (FOLVITE) 1 mg tablet Take one tablet by mouth daily.    gabapentin  (NEURONTIN ) 300 mg capsule Take three capsules by mouth three times daily.    GEMTESA 75 mg tablet Take one tablet by mouth daily.    L.acidoph/B.animalis/B.longum (FLORAJEN3 PO) Take 390 mg by mouth daily.    lidocaine  (LIDODERM ) 5 % topical patch Apply one patch topically to affected area as Needed.    MELATONIN PO Take 10 mg by mouth at bedtime daily.    oxyCODONE  (ROXICODONE ) 5 mg tablet Take one tablet by mouth every 8 hours as needed.    pantoprazole  DR (PROTONIX ) 40 mg tablet Take one tablet by mouth daily.    potassium chloride  SR (KLOR-CON  M20) 20 mEq tablet Take two tablets by mouth daily. (Patient taking differently: Take one tablet by mouth daily.)    predniSONE  (DELTASONE ) 5 mg tablet Take one tablet by mouth daily.    relugolix (ORGOVYX) 120 mg tablet Take one tablet by mouth daily.    vit A/vit C/vit E/zinc/copper (PRESERVISION AREDS PO) Take 1 tablet by mouth twice daily.                 [1]   Social History  Tobacco Use   Smoking Status Never   Smokeless Tobacco Never

## 2024-07-20 ENCOUNTER — Encounter: Admit: 2024-07-20 | Discharge: 2024-07-20 | Payer: MEDICARE

## 2024-07-20 MED ORDER — DILTIAZEM HCL 180 MG PO CP24
180 mg | ORAL_CAPSULE | Freq: Two times a day (BID) | ORAL | 3 refills | 90.00000 days | Status: AC
Start: 2024-07-20 — End: ?

## 2024-07-20 NOTE — Telephone Encounter
 Received a call from [employer], APRN-NP at [facility] requesting approval to start radiation.  She states that Marcey is scheduled for radiation of his chest starting tomorrow for 5 total doses.  She also inquired about his pacemaker.    Discussed with Dr. Velora.  Patient may proceed with radiation.  He will need to have his device interrogated by a device rep prior to and after radiation treatment.    Called and left VM on Crystal's line with recommendations and callback number.

## 2024-07-28 ENCOUNTER — Encounter: Admit: 2024-07-28 | Discharge: 2024-07-28 | Payer: MEDICARE

## 2024-08-09 ENCOUNTER — Encounter: Admit: 2024-08-09 | Discharge: 2024-08-09 | Payer: MEDICARE

## 2024-08-12 ENCOUNTER — Encounter: Admit: 2024-08-12 | Discharge: 2024-08-12 | Payer: MEDICARE

## 2024-08-12 NOTE — Telephone Encounter
 This encounter was created in error. Please disregard.

## 2024-08-15 ENCOUNTER — Encounter: Admit: 2024-08-15 | Discharge: 2024-08-15 | Payer: MEDICARE

## 2024-08-17 ENCOUNTER — Encounter: Admit: 2024-08-17 | Discharge: 2024-08-17 | Payer: MEDICARE

## 2024-08-17 ENCOUNTER — Ambulatory Visit: Admit: 2024-08-17 | Discharge: 2024-08-17 | Payer: MEDICARE

## 2024-08-26 ENCOUNTER — Encounter: Admit: 2024-08-26 | Discharge: 2024-08-26 | Payer: MEDICARE

## 2024-08-26 ENCOUNTER — Ambulatory Visit: Admit: 2024-08-26 | Discharge: 2024-08-26 | Payer: MEDICARE

## 2024-08-26 VITALS — BP 140/86 | HR 61 | Ht 72.0 in | Wt 262.0 lb

## 2024-08-26 DIAGNOSIS — Z953 Presence of xenogenic heart valve: Secondary | ICD-10-CM

## 2024-08-26 DIAGNOSIS — I2510 Atherosclerotic heart disease of native coronary artery without angina pectoris: Secondary | ICD-10-CM

## 2024-08-26 DIAGNOSIS — I480 Paroxysmal atrial fibrillation: Secondary | ICD-10-CM

## 2024-08-26 DIAGNOSIS — R0989 Other specified symptoms and signs involving the circulatory and respiratory systems: Principal | ICD-10-CM

## 2024-08-26 DIAGNOSIS — Z950 Presence of cardiac pacemaker: Secondary | ICD-10-CM

## 2024-08-26 DIAGNOSIS — I350 Nonrheumatic aortic (valve) stenosis: Secondary | ICD-10-CM

## 2024-08-26 DIAGNOSIS — I495 Sick sinus syndrome: Secondary | ICD-10-CM

## 2024-08-26 MED ADMIN — SODIUM CHLORIDE 0.9 % IJ SOLN [7319]: 10 mL | INTRAVENOUS | @ 16:00:00 | Stop: 2024-08-26 | NDC 63323018610

## 2024-08-26 MED ADMIN — PERFLUTREN LIPID MICROSPHERES 1.1 MG/ML IV SUSP [79178]: 3 mL | INTRAVENOUS | @ 16:00:00 | Stop: 2024-08-26 | NDC 11994001116

## 2024-08-26 NOTE — Progress Notes
 Date of Service: 08/26/2024    Robert Mercado is a 78 y.o. male.       HPI       I had the pleasure of seeing Robert Mercado for 1 year post TAVR follow-up.  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  He developed severe aortic stenosis.  He had a cardiac cath that showed 90% stenosis to the ramus and had a stent placed to the lesion.  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  Follow-up echoes have revealed a well-seated valve with no stenosis or regurgitation.    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  He is not very active due to hip pain.  He had hip replacement in May.  He is doing physical therapy twice a week.  He has had paroxysmal A-fib and is on dronedarone .  He is on Eliquis for anticoagulation.  He denies any significant shortness of breath with his level of activity.  He has occasional chest pain and pounding when he is active.  He has chronic lower extremity edema.         Vitals:    08/26/24 1101   BP: (!) 140/86   BP Source: Arm, Left Upper   Pulse: 61   PainSc: Zero   Weight: 118.8 kg (262 lb)   Height: 182.9 cm (6')     Body mass index is 35.53 kg/m?SABRA     Past Medical History  Patient Active Problem List    Diagnosis Date Noted    S/p TAVR (transcatheter aortic valve replacement), bioprosthetic 08/20/2023     08/20/23: Dr. Linford Labrum and Dr. Grayson, # 29 Sapien S3      Hypokalemia 08/20/2023    Hypocalcemia 08/20/2023    Nonrheumatic aortic valve stenosis 07/28/2023    Renal mass 07/28/2023    Acute on chronic combined systolic (congestive) and diastolic (congestive) heart failure (CMS-HCC) 04/30/2023    Medication side effects 08/11/2022     While hospitalized in September 2023 patient was initiated on amiodarone  for rate control in the setting of atrial fibrillation.  He developed an extensive, generalized micropapular rash thought to be due to amiodarone .      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 Lannis: 30% instent restenosis of previously placed stent in the LAD, and patent 2nd  DIAG stent and 60% distal LAD disease. Medical management  11/20/2016 - Cardiac Catheterization:  [facility])  Moderately depressed LV function, ejection fraction 30% to 40% with global hypokinesis and no mitral regurgitation, well compensated (EDP equals 10).  Noncritical coronary disease as noted; left main 0% stenosis, LAD proximal  0%, mid 30%, circumflex 0%, RCA (dominant) 0%.   10/19/2020 - Cardiac Catheterization:  ([facility])  Mid LAD lesion is 55% stenosed. IFR does not indicate any hemodynamically significant disease.  1st Diag lesion is 70% stenosed. Small to medium size.  2nd Diag lesion is 50% stenosed. Small-vessel.  There is mild left ventricular systolic dysfunction. The ejection fraction is 45-50% with apical wall hypokinesis.  04/17/2022 - Regadenoson  MPI:   This study is normal with no evidence of significant myocardial ischemia. Left ventricular systolic function is low normal. There are no high risk prognostic indicators present.  The pharmacologic ECG portion of the study is negative for ischemia.  Comparison is made with a prior ADAC study completed 07/09/2018.  Ejection fraction was 46%.  Compared to the previous study left ventricular ejection fraction is increased, no change in perfusion findings.  In aggregate the current study is low risk in regards to predicted annual cardiovascular mortality rate.  01/09/2023 - Cardiac Catheterization:  ([facility])  Unsuccessful PCI of the subdivision of the ramus intermedius secondary to difficulty with access and inability to adequately cannulate the left main trunk   01/15/2023 - Cardiac Catheterization:  ([facility])  Successful PTCA/DES of ramus intermedius with reduction in stenosis from 90% to 0%.       Peripheral neuropathy 11/12/2009         Review of Systems   Constitutional: Negative.   HENT: Negative.     Eyes: Negative.    Cardiovascular: Negative.    Respiratory: Negative.     Endocrine: Negative.    Hematologic/Lymphatic: Negative.    Skin: Negative.    Musculoskeletal: Negative.    Gastrointestinal: Negative.    Genitourinary: Negative.    Neurological: Negative.    Psychiatric/Behavioral: Negative.     Allergic/Immunologic: Negative.        Physical Exam  General Appearance: no acute distress  Skin: warm & intact  HEENT: unremarkable  Neck Veins: neck veins are flat & not distended  Carotid Arteries: no bruits  Chest Inspection: chest is normal in appearance  Auscultation/Percussion: lungs clear to auscultation, no rales, rhonchi, or wheezing  Cardiac Rhythm: regular rhythm & normal rate  Cardiac Auscultation: Normal S1 & S2, no S3 or S4, no rub  Murmurs: no cardiac murmurs   Extremities:2+ bilateral lower extremity edema; 2+ symmetric distal pulses  Abdominal Exam: soft, non-tender, no masses, bowel sounds normal  Liver & Spleen: no organomegaly  Neurologic Exam: oriented to time, place and person; no focal neurologic deficits  Psychiatric: Normal mood and affect.  Behavior is normal. Judgment and thought content normal.       Cardiovascular Studies  Preliminary EKG:    NSR, rate 61 bpm. V-paced complexes. RBBB    Cardiovascular Health Factors  Vitals BP Readings from Last 3 Encounters:   08/26/24 (!) 140/86   08/26/24 (!) 158/78   07/12/24 136/84     Wt Readings from Last 3 Encounters:   08/26/24 118.8 kg (262 lb)   08/26/24 118.8 kg (262 lb)   07/12/24 122.6 kg (270 lb 3.2 oz)     BMI Readings from Last 3 Encounters:   08/26/24 35.53 kg/m?   08/26/24  35.53 kg/m?   07/12/24 36.65 kg/m?      Smoking Tobacco Use History[1]   Lipid Profile Cholesterol   Date Value Ref Range Status   07/29/2022 117 <200 MG/DL Final     HDL   Date Value Ref Range Status   07/29/2022 43 >40 MG/DL Final     LDL   Date Value Ref Range Status   07/29/2022 61 <100 mg/dL Final     Triglycerides   Date Value Ref Range Status   07/29/2022 74 <150 MG/DL Final      Blood Sugar Hemoglobin A1C   Date Value Ref Range Status   08/17/2023 5.5 4.0 - 5.7 % Final     Comment:     The ADA recommends that most patients with type 1 and type 2 diabetes maintain   an A1c level <7%.       Glucose   Date Value Ref Range Status   06/08/2024 119 (H) 70 - 115 Final   10/21/2023 99  Final   08/21/2023 118 (H) 70 - 100 MG/DL Final          Problems Addressed Today  Encounter Diagnoses   Name Primary?    Cardiovascular symptoms Yes       Assessment and Plan     1.  Aortic stenosis status post TAVR with a 29 SAPIEN valve in October 2024.  He had an echocardiogram today which is pending however on preliminary review the valve is well-seated with no stenosis or regurgitation.  The mean gradient is 6 mmHg.  He needs to continue aspirin  and SBE prophylaxis lifelong.    2.  Chronic diastolic heart failure.  He has NYHA class II symptoms.  He has chronic lower extremity edema.  He takes Bumex  2 mg in the morning and 1 mg in the evening.    3.  Coronary artery disease.  He has had prior PCI to the diagonal, LAD and ramus.  He had a stress test in December of last year that had no evidence of ischemia.    4.  Paroxysmal A-fib.  He is on dronedarone  and apixaban    5.  Sinus node dysfunction with pacemaker in place    He should continue to follow with Dr. Velora.  We can see him in the valve clinic on an as-needed basis.  Thank you for allowing us  to participate in the care of this pleasant individual.  If you have any other questions or concerns, please do not hesitate to contact us .    DOROTHA Stephane Piety, APRN  Structural Heart Nurse Practitioner             Current Medications (including today's revisions)   abiraterone (ZYTIGA) 250 mg tablet Take four tablets by mouth daily.    atorvastatin  (LIPITOR) 80 mg tablet Take one tablet by mouth daily.    bumetanide  (BUMEX ) 2 mg tablet Take one tablet by mouth twice daily. Indications: accumulation of fluid resulting from chronic heart failure (Patient taking differently: Take one tablet by mouth. Pt takes 2mg  every morning and 1mg  in the PM  Indications: accumulation of fluid resulting from chronic heart failure)    Cyanocobalamin 2,000 mcg TbER Take one tablet by mouth daily.    cyclobenzaprine (FLEXERIL) 5 mg tablet Take one tablet by mouth three times daily as needed.    dilTIAZem  CD (CARDIZEM  CD) 180 mg capsule Take one capsule by mouth twice daily. Indications: paroxysmal supraventricular tachycardia    dronedarone  (MULTAQ ) 400 mg  tablet Take one tablet by mouth twice daily with meals.    ELIQUIS DVT-PE TREAT 30D START 5 mg (74 tabs) tablets in a dose pack Take one tablet by mouth twice daily.    fluticasone  propionate (FLONASE ) 50 mcg/actuation nasal spray, suspension Apply two sprays to each nostril as directed twice daily.    folic acid (FOLVITE) 1 mg tablet Take one tablet by mouth daily.    gabapentin  (NEURONTIN ) 300 mg capsule Take three capsules by mouth three times daily.    GEMTESA 75 mg tablet Take one tablet by mouth daily.    L.acidoph/B.animalis/B.longum (FLORAJEN3 PO) Take 390 mg by mouth daily.    lidocaine  (LIDODERM ) 5 % topical patch Apply one patch topically to affected area as Needed.    MELATONIN PO Take 10 mg by mouth at bedtime daily.    oxyCODONE  (ROXICODONE ) 5 mg tablet Take one tablet by mouth every 8 hours as needed.    pantoprazole  DR (PROTONIX ) 40 mg tablet Take one tablet by mouth daily.    potassium chloride  SR (KLOR-CON  M20) 20 mEq tablet Take two tablets by mouth daily.    predniSONE  (DELTASONE ) 5 mg tablet Take one tablet by mouth daily.    relugolix (ORGOVYX) 120 mg tablet Take one tablet by mouth daily.    vit A/vit C/vit E/zinc/copper (PRESERVISION AREDS PO) Take 1 tablet by mouth twice daily.                 [1]   Social History  Tobacco Use   Smoking Status Never   Smokeless Tobacco Never

## 2024-09-07 ENCOUNTER — Encounter: Admit: 2024-09-07 | Discharge: 2024-09-07 | Payer: MEDICARE

## 2024-09-09 ENCOUNTER — Encounter: Admit: 2024-09-09 | Discharge: 2024-09-09 | Payer: MEDICARE

## 2024-09-09 NOTE — Telephone Encounter [36]
 Patient having markers for prostate ca placed on Monday.  Called for cardiac risk assessment since he had episode of chest discomfort/GERD 10/20.   Recent Echo 08/17/24.  11/18/22 Non ischemic stress test.  Reviewed with Dr. Velora.    Patient is low risk from an cardiac perspective to proceed with procedure.    Dr. Joanell Velora M.D.

## 2024-09-21 ENCOUNTER — Encounter: Admit: 2024-09-21 | Discharge: 2024-09-21 | Payer: MEDICARE

## 2024-09-28 ENCOUNTER — Encounter: Admit: 2024-09-28 | Discharge: 2024-09-28 | Payer: MEDICARE

## 2024-09-28 NOTE — Telephone Encounter [36]
 Ocallaghan, Melanie, RN  Elaine Pierce, RN  This was discussed with Dr. Velora in the office. Her recommendations are for the patient to have a pacemaker interrogation after the radiation to his prostate is completed.          Previous Messages       ----- Message -----  From: Elaine Pierce, RN  Sent: 09/28/2024  12:00 AM CST  To: [facility] Nurse [place]  Subject: Ask [facility] - Radiation Question about pacemaker    Crystal Clark called into the nursing line to ask if patient would need to have pacemaker interrogated after his radiation to his prostate.  That is not usually their process, but they wanted to know Dr. Elenora recommendations.

## 2024-09-29 ENCOUNTER — Encounter: Admit: 2024-09-29 | Discharge: 2024-09-29 | Payer: MEDICARE

## 2024-09-29 NOTE — Telephone Encounter [36]
 Mercado, Robert  N, MD  Samuella Mort, RN  Patient's Revised Cardiac Risk Index  is moderate for a high risk surgery.   Estimated risk of adverse outcomes (perioperative/postoperative events including myocardial infarction, pulmonary edema, ventricular fibrillation, cardiac arrest or complete heart block) with noncardiac surgery is  ~ 7%  from a cardiac standpoint. This patient does not have any contraindication to undergo general anesthesia and the planned surgical procedure.  The full risks and benefits will have to be discussed with the Anesthesiologist  and the Surgeon that will perform the procedure.      Apixaban hold at least 72 hours prior to procedure or what ever day feel comfortable with.  Resume apixaban when risk of bleeding is minimal/acceptable from a surgical standpoint.      TY          Recommendations faxed to Dr. Eldridge office.   [phone]  Fax:[phone]

## 2024-09-29 NOTE — Telephone Encounter [36]
 Received a call from [facility] with Dr. Eldridge office stating that patient has been diagnosed with renal cell carcinoma and is currently scheduled for a left nephrectomy on 10/24/24. They are wanting cardiac clearance and approval from Dr. Velora for patient to hold Eliquis for procedure.     Patient is on Eliquis for Atrial fibrillation and history of PE. CHADSVASC score 7.     Last OV with Dr. Velora 06/2024  Testing: Echo 08/2024- Normal LV systolic function. EF 60-65%. No valvular abnormalities.    Reg MPI- 10/2023- No significant evidence of myocardial ischemia.         Will discuss with [facility].

## 2024-10-22 ENCOUNTER — Encounter: Admit: 2024-10-22 | Discharge: 2024-10-22 | Payer: MEDICARE

## 2024-11-08 ENCOUNTER — Encounter: Admit: 2024-11-08 | Discharge: 2024-11-08 | Payer: MEDICARE

## 2024-11-09 ENCOUNTER — Encounter: Admit: 2024-11-09 | Discharge: 2024-11-09 | Payer: MEDICARE

## 2024-11-14 ENCOUNTER — Encounter: Admit: 2024-11-14 | Discharge: 2024-11-14 | Payer: MEDICARE

## 2024-11-29 ENCOUNTER — Encounter: Admit: 2024-11-29 | Discharge: 2024-11-29 | Payer: MEDICARE

## 2024-12-02 ENCOUNTER — Encounter: Admit: 2024-12-02 | Discharge: 2024-12-02 | Payer: MEDICARE

## 2024-12-19 ENCOUNTER — Encounter: Admit: 2024-12-19 | Discharge: 2024-12-19 | Payer: MEDICARE

## 2024-12-19 MED ORDER — DILTIAZEM HCL 180 MG PO CP24
180 mg | ORAL_CAPSULE | Freq: Two times a day (BID) | ORAL | 3 refills | Status: CN
Start: 2024-12-19 — End: ?

## 2024-12-19 MED ORDER — MULTAQ 400 MG PO TAB
400 mg | ORAL_TABLET | Freq: Two times a day (BID) | ORAL | 3 refills | Status: AC
Start: 2024-12-19 — End: ?

## 2024-12-19 MED ORDER — MULTAQ 400 MG PO TAB
400 mg | ORAL_TABLET | Freq: Two times a day (BID) | ORAL | 3 refills | Status: CN
Start: 2024-12-19 — End: ?

## 2024-12-19 MED ORDER — APIXABAN 5 MG PO TAB
5 mg | ORAL_TABLET | Freq: Two times a day (BID) | ORAL | 1 refills | 30.00000 days | Status: AC
Start: 2024-12-19 — End: ?

## 2024-12-19 MED ORDER — ELIQUIS DVT-PE TREAT 30D START 5 MG (74 TABS) PO DSPK
5 mg | ORAL_TABLET | Freq: Two times a day (BID) | ORAL | 3 refills | Status: CN
Start: 2024-12-19 — End: ?

## 2024-12-19 MED ORDER — DILTIAZEM HCL 180 MG PO CP24
180 mg | ORAL_CAPSULE | Freq: Two times a day (BID) | ORAL | 3 refills | 90.00000 days | Status: AC
Start: 2024-12-19 — End: ?

## 2024-12-20 ENCOUNTER — Encounter: Admit: 2024-12-20 | Discharge: 2024-12-20 | Payer: MEDICARE

## 2024-12-20 MED ORDER — MULTAQ 400 MG PO TAB
400 mg | ORAL_TABLET | Freq: Two times a day (BID) | ORAL | 3 refills | Status: AC
Start: 2024-12-20 — End: ?
